# Patient Record
Sex: Female | Born: 1935
Health system: Southern US, Community
[De-identification: ages and names within clinical notes are randomized; demographics above are authoritative.]

## PROBLEM LIST (undated history)

## (undated) ENCOUNTER — Emergency Department (HOSPITAL_COMMUNITY): Discharge status: Against Medical Advice | Payer: Medicare Other

## (undated) DIAGNOSIS — M545 Low back pain, unspecified: Secondary | ICD-10-CM

## (undated) DIAGNOSIS — E785 Hyperlipidemia, unspecified: Secondary | ICD-10-CM

## (undated) DIAGNOSIS — Z860100 Personal history of colon polyps, unspecified: Secondary | ICD-10-CM

## (undated) DIAGNOSIS — K5790 Diverticulosis of intestine, part unspecified, without perforation or abscess without bleeding: Secondary | ICD-10-CM

## (undated) DIAGNOSIS — K219 Gastro-esophageal reflux disease without esophagitis: Secondary | ICD-10-CM

## (undated) DIAGNOSIS — M1990 Unspecified osteoarthritis, unspecified site: Secondary | ICD-10-CM

## (undated) HISTORY — DX: Personal history of colon polyps, unspecified: Z86.0100

## (undated) HISTORY — DX: Hyperlipidemia, unspecified: E78.5

## (undated) HISTORY — DX: Low back pain: M54.5

## (undated) HISTORY — PX: MOUTH SURGERY: SHX715

## (undated) HISTORY — DX: Low back pain, unspecified: M54.50

## (undated) HISTORY — PX: CATARACT EXTRACTION: SUR2

## (undated) HISTORY — DX: Personal history of colonic polyps: Z86.010

## (undated) HISTORY — DX: Unspecified osteoarthritis, unspecified site: M19.90

## (undated) HISTORY — DX: Diverticulosis of intestine, part unspecified, without perforation or abscess without bleeding: K57.90

## (undated) HISTORY — PX: COLONOSCOPY W/ POLYPECTOMY: SHX1380

## (undated) HISTORY — PX: UPPER GI ENDOSCOPY: SHX6162

## (undated) HISTORY — DX: Gastro-esophageal reflux disease without esophagitis: K21.9

---

## 1998-09-24 HISTORY — PX: UMBILICAL HERNIA REPAIR: SHX196

## 2002-09-24 HISTORY — PX: POLYPECTOMY: SHX149

## 2004-11-24 ENCOUNTER — Ambulatory Visit: Payer: Self-pay | Admitting: Internal Medicine

## 2005-01-10 ENCOUNTER — Ambulatory Visit: Payer: Self-pay | Admitting: Internal Medicine

## 2005-05-31 ENCOUNTER — Ambulatory Visit: Payer: Self-pay | Admitting: Internal Medicine

## 2005-06-07 ENCOUNTER — Ambulatory Visit (HOSPITAL_COMMUNITY): Admission: RE | Admit: 2005-06-07 | Discharge: 2005-06-07 | Payer: Self-pay | Admitting: Family Medicine

## 2006-06-07 ENCOUNTER — Encounter: Payer: Self-pay | Admitting: Internal Medicine

## 2006-06-07 ENCOUNTER — Ambulatory Visit: Payer: Self-pay | Admitting: Internal Medicine

## 2006-06-12 ENCOUNTER — Ambulatory Visit (HOSPITAL_COMMUNITY): Admission: RE | Admit: 2006-06-12 | Discharge: 2006-06-12 | Payer: Self-pay | Admitting: Internal Medicine

## 2007-06-18 ENCOUNTER — Ambulatory Visit (HOSPITAL_COMMUNITY): Admission: RE | Admit: 2007-06-18 | Discharge: 2007-06-18 | Payer: Self-pay | Admitting: Family Medicine

## 2007-07-09 ENCOUNTER — Ambulatory Visit: Payer: Self-pay | Admitting: Internal Medicine

## 2007-07-09 ENCOUNTER — Encounter: Payer: Self-pay | Admitting: Internal Medicine

## 2007-07-09 LAB — CONVERTED CEMR LAB
ALT: 10 units/L (ref 0–35)
AST: 13 units/L (ref 0–37)
Albumin: 4.1 g/dL (ref 3.5–5.2)
CO2: 25 meq/L (ref 19–32)
Calcium: 8.8 mg/dL (ref 8.4–10.5)
Chloride: 104 meq/L (ref 96–112)
Cholesterol: 255 mg/dL — ABNORMAL HIGH (ref 0–200)
Potassium: 4.1 meq/L (ref 3.5–5.3)
Sodium: 139 meq/L (ref 135–145)
Total CHOL/HDL Ratio: 6.1
Total Protein: 7.1 g/dL (ref 6.0–8.3)

## 2008-07-01 ENCOUNTER — Ambulatory Visit: Payer: Self-pay | Admitting: Internal Medicine

## 2008-07-07 ENCOUNTER — Ambulatory Visit (HOSPITAL_COMMUNITY): Admission: RE | Admit: 2008-07-07 | Discharge: 2008-07-07 | Payer: Self-pay | Admitting: Internal Medicine

## 2008-09-02 ENCOUNTER — Ambulatory Visit: Payer: Self-pay | Admitting: Internal Medicine

## 2008-09-02 LAB — CONVERTED CEMR LAB
BUN: 14 mg/dL (ref 6–23)
Chloride: 104 meq/L (ref 96–112)
Cholesterol: 248 mg/dL — ABNORMAL HIGH (ref 0–200)
Glucose, Bld: 91 mg/dL (ref 70–99)
LDL Cholesterol: 176 mg/dL — ABNORMAL HIGH (ref 0–99)
Potassium: 4.1 meq/L (ref 3.5–5.3)
Sodium: 139 meq/L (ref 135–145)
Total CHOL/HDL Ratio: 5.2
VLDL: 24 mg/dL (ref 0–40)

## 2008-10-01 ENCOUNTER — Ambulatory Visit: Payer: Self-pay | Admitting: *Deleted

## 2008-12-07 ENCOUNTER — Encounter (INDEPENDENT_AMBULATORY_CARE_PROVIDER_SITE_OTHER): Payer: Self-pay | Admitting: Internal Medicine

## 2008-12-07 ENCOUNTER — Ambulatory Visit: Payer: Self-pay | Admitting: Family Medicine

## 2008-12-07 LAB — CONVERTED CEMR LAB
Cholesterol: 178 mg/dL (ref 0–200)
LDL Cholesterol: 106 mg/dL — ABNORMAL HIGH (ref 0–99)
VLDL: 19 mg/dL (ref 0–40)

## 2009-07-13 ENCOUNTER — Ambulatory Visit (HOSPITAL_COMMUNITY): Admission: RE | Admit: 2009-07-13 | Discharge: 2009-07-13 | Payer: Self-pay | Admitting: Internal Medicine

## 2009-07-22 ENCOUNTER — Ambulatory Visit: Payer: Self-pay | Admitting: Internal Medicine

## 2009-09-02 ENCOUNTER — Ambulatory Visit: Payer: Self-pay | Admitting: Internal Medicine

## 2009-09-02 LAB — CONVERTED CEMR LAB
ALT: 10 units/L (ref 0–35)
AST: 13 units/L (ref 0–37)
Albumin: 4.4 g/dL (ref 3.5–5.2)
BUN: 11 mg/dL (ref 6–23)
CO2: 27 meq/L (ref 19–32)
Calcium: 8.8 mg/dL (ref 8.4–10.5)
Chloride: 105 meq/L (ref 96–112)
Cholesterol: 181 mg/dL (ref 0–200)
Potassium: 4.2 meq/L (ref 3.5–5.3)

## 2010-03-24 ENCOUNTER — Emergency Department (HOSPITAL_COMMUNITY)
Admission: EM | Admit: 2010-03-24 | Discharge: 2010-03-25 | Payer: Self-pay | Source: Home / Self Care | Admitting: Emergency Medicine

## 2010-04-25 ENCOUNTER — Encounter: Payer: Self-pay | Admitting: Internal Medicine

## 2010-04-25 ENCOUNTER — Ambulatory Visit: Payer: Self-pay | Admitting: Internal Medicine

## 2010-04-25 DIAGNOSIS — R0789 Other chest pain: Secondary | ICD-10-CM | POA: Insufficient documentation

## 2010-04-25 DIAGNOSIS — S060X9A Concussion with loss of consciousness of unspecified duration, initial encounter: Secondary | ICD-10-CM | POA: Insufficient documentation

## 2010-04-25 DIAGNOSIS — E785 Hyperlipidemia, unspecified: Secondary | ICD-10-CM | POA: Insufficient documentation

## 2010-04-25 DIAGNOSIS — M4120 Other idiopathic scoliosis, site unspecified: Secondary | ICD-10-CM | POA: Insufficient documentation

## 2010-04-25 DIAGNOSIS — M542 Cervicalgia: Secondary | ICD-10-CM | POA: Insufficient documentation

## 2010-04-25 DIAGNOSIS — M25511 Pain in right shoulder: Secondary | ICD-10-CM | POA: Insufficient documentation

## 2010-04-25 DIAGNOSIS — R1013 Epigastric pain: Secondary | ICD-10-CM | POA: Insufficient documentation

## 2010-04-25 LAB — CONVERTED CEMR LAB: Blood Glucose, Fingerstick: 96

## 2010-04-26 ENCOUNTER — Ambulatory Visit: Payer: Self-pay | Admitting: Internal Medicine

## 2010-04-26 DIAGNOSIS — M545 Low back pain, unspecified: Secondary | ICD-10-CM | POA: Insufficient documentation

## 2010-04-26 DIAGNOSIS — Z86010 Personal history of colonic polyps: Secondary | ICD-10-CM | POA: Insufficient documentation

## 2010-04-26 LAB — CONVERTED CEMR LAB
ALT: 13 units/L (ref 0–35)
AST: 13 units/L (ref 0–37)
Alkaline Phosphatase: 55 units/L (ref 39–117)
Bilirubin, Direct: 0.1 mg/dL (ref 0.0–0.3)
CO2: 30 meq/L (ref 19–32)
Chloride: 103 meq/L (ref 96–112)
Eosinophils Relative: 1.8 % (ref 0.0–5.0)
HCT: 33.9 % — ABNORMAL LOW (ref 36.0–46.0)
Hemoglobin: 11.8 g/dL — ABNORMAL LOW (ref 12.0–15.0)
Hgb A1c MFr Bld: 5.7 % (ref 4.6–6.5)
Lymphs Abs: 1.9 10*3/uL (ref 0.7–4.0)
Monocytes Relative: 7.5 % (ref 3.0–12.0)
Nitrite: NEGATIVE
Platelets: 189 10*3/uL (ref 150.0–400.0)
RBC: 3.75 M/uL — ABNORMAL LOW (ref 3.87–5.11)
Sodium: 140 meq/L (ref 135–145)
TSH: 1.61 microintl units/mL (ref 0.35–5.50)
Total Protein, Urine: NEGATIVE mg/dL
Total Protein: 6.6 g/dL (ref 6.0–8.3)
Urobilinogen, UA: 0.2 (ref 0.0–1.0)
WBC: 4.6 10*3/uL (ref 4.5–10.5)

## 2010-04-28 ENCOUNTER — Telehealth: Payer: Self-pay | Admitting: Internal Medicine

## 2010-05-11 ENCOUNTER — Ambulatory Visit: Payer: Self-pay | Admitting: Internal Medicine

## 2010-05-11 DIAGNOSIS — E538 Deficiency of other specified B group vitamins: Secondary | ICD-10-CM | POA: Insufficient documentation

## 2010-05-11 DIAGNOSIS — K219 Gastro-esophageal reflux disease without esophagitis: Secondary | ICD-10-CM | POA: Insufficient documentation

## 2010-05-11 DIAGNOSIS — E559 Vitamin D deficiency, unspecified: Secondary | ICD-10-CM | POA: Insufficient documentation

## 2010-06-01 ENCOUNTER — Encounter: Payer: Self-pay | Admitting: Internal Medicine

## 2010-06-06 ENCOUNTER — Telehealth: Payer: Self-pay | Admitting: Internal Medicine

## 2010-06-15 ENCOUNTER — Ambulatory Visit: Payer: Self-pay | Admitting: Internal Medicine

## 2010-06-21 ENCOUNTER — Encounter: Payer: Self-pay | Admitting: Internal Medicine

## 2010-07-04 ENCOUNTER — Telehealth: Payer: Self-pay | Admitting: Internal Medicine

## 2010-07-19 ENCOUNTER — Ambulatory Visit (HOSPITAL_COMMUNITY): Admission: RE | Admit: 2010-07-19 | Discharge: 2010-07-19 | Payer: Self-pay | Admitting: Internal Medicine

## 2010-07-26 ENCOUNTER — Ambulatory Visit: Payer: Self-pay | Admitting: Gynecology

## 2010-07-26 ENCOUNTER — Encounter: Payer: Self-pay | Admitting: Internal Medicine

## 2010-07-26 ENCOUNTER — Other Ambulatory Visit: Admission: RE | Admit: 2010-07-26 | Discharge: 2010-07-26 | Payer: Self-pay | Admitting: Gynecology

## 2010-08-08 ENCOUNTER — Ambulatory Visit: Payer: Self-pay | Admitting: Internal Medicine

## 2010-09-15 ENCOUNTER — Ambulatory Visit: Payer: Self-pay | Admitting: Internal Medicine

## 2010-09-15 ENCOUNTER — Encounter: Payer: Self-pay | Admitting: Internal Medicine

## 2010-09-15 DIAGNOSIS — R42 Dizziness and giddiness: Secondary | ICD-10-CM | POA: Insufficient documentation

## 2010-09-15 DIAGNOSIS — R1110 Vomiting, unspecified: Secondary | ICD-10-CM | POA: Insufficient documentation

## 2010-09-15 LAB — CONVERTED CEMR LAB
BUN: 12 mg/dL (ref 6–23)
Basophils Absolute: 0 10*3/uL (ref 0.0–0.1)
Basophils Relative: 0.6 % (ref 0.0–3.0)
Bilirubin Urine: NEGATIVE
CO2: 30 meq/L (ref 19–32)
Calcium: 9.3 mg/dL (ref 8.4–10.5)
Chloride: 102 meq/L (ref 96–112)
Creatinine, Ser: 0.8 mg/dL (ref 0.4–1.2)
Eosinophils Absolute: 0.1 10*3/uL (ref 0.0–0.7)
Eosinophils Relative: 1.1 % (ref 0.0–5.0)
GFR calc non Af Amer: 71.35 mL/min (ref >60.00)
Glucose, Bld: 89 mg/dL (ref 70–99)
HCT: 36.9 % (ref 36.0–46.0)
Hemoglobin: 12.7 g/dL (ref 12.0–15.0)
Ketones, ur: NEGATIVE mg/dL
Leukocytes, UA: NEGATIVE
Lipase: 25 units/L (ref 11.0–59.0)
Lymphocytes Relative: 42.1 % (ref 12.0–46.0)
Lymphs Abs: 2.1 10*3/uL (ref 0.7–4.0)
MCHC: 34.5 g/dL (ref 30.0–36.0)
MCV: 90.4 fL (ref 78.0–100.0)
Monocytes Absolute: 0.4 10*3/uL (ref 0.1–1.0)
Monocytes Relative: 7.8 % (ref 3.0–12.0)
Neutro Abs: 2.4 10*3/uL (ref 1.4–7.7)
Neutrophils Relative %: 48.4 % (ref 43.0–77.0)
Nitrite: NEGATIVE
Platelets: 207 10*3/uL (ref 150.0–400.0)
Potassium: 4.1 meq/L (ref 3.5–5.1)
RBC: 4.08 M/uL (ref 3.87–5.11)
RDW: 12.6 % (ref 11.5–14.6)
Sodium: 139 meq/L (ref 135–145)
Specific Gravity, Urine: 1.015 (ref 1.000–1.030)
Total Protein, Urine: NEGATIVE mg/dL
Urine Glucose: NEGATIVE mg/dL
Urobilinogen, UA: 0.2 (ref 0.0–1.0)
Vit D, 25-Hydroxy: 49 ng/mL (ref 30–89)
Vitamin B-12: 605 pg/mL (ref 211–911)
WBC: 4.9 10*3/uL (ref 4.5–10.5)
pH: 7.5 (ref 5.0–8.0)

## 2010-09-29 ENCOUNTER — Ambulatory Visit
Admission: RE | Admit: 2010-09-29 | Discharge: 2010-09-29 | Payer: Self-pay | Source: Home / Self Care | Attending: Internal Medicine | Admitting: Internal Medicine

## 2010-09-29 DIAGNOSIS — R21 Rash and other nonspecific skin eruption: Secondary | ICD-10-CM | POA: Insufficient documentation

## 2010-10-04 ENCOUNTER — Ambulatory Visit
Admission: RE | Admit: 2010-10-04 | Discharge: 2010-10-04 | Payer: Self-pay | Source: Home / Self Care | Attending: Gynecology | Admitting: Gynecology

## 2010-10-13 ENCOUNTER — Ambulatory Visit
Admission: RE | Admit: 2010-10-13 | Discharge: 2010-10-13 | Payer: Self-pay | Source: Home / Self Care | Attending: Gynecology | Admitting: Gynecology

## 2010-10-24 NOTE — Progress Notes (Signed)
Summary: REFERRAL   Phone Note Call from Patient Call back at Home Phone [phone]   Caller: Daughter - [phone] Summary of Call: Pt's daughter called. Patient needs a referral from PCP for GYN. Pt already has an apt w/[facility].  Initial call taken by: Lamar Sprinkles, CMA,  July 04, 2010 12:32 PM  Follow-up for Phone Call        ok to ref  ?name Follow-up by: Tresa Garter MD,  July 04, 2010 5:40 PM  Additional Follow-up for Phone Call Additional follow up Details #1::        Unsure of MD, order is in for [facility] - [facility] will have info b/c pt already has apt.  Additional Follow-up by: Lamar Sprinkles, CMA,  July 04, 2010 5:48 PM
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
## 2010-10-24 NOTE — Assessment & Plan Note (Signed)
Summary: FLU VAC  AVP  STC  Nurse Visit   Allergies: 1)  Lipitor (Atorvastatin Calcium)  Orders Added: 1)  Flu Vaccine 46yrs + MEDICARE PATIENTS [Q2039] 2)  Administration Flu vaccine - MCR [G0008] Flu Vaccine Consent Questions     Do you have a history of severe allergic reactions to this vaccine? no    Any prior history of allergic reactions to egg and/or gelatin? no    Do you have a sensitivity to the preservative Thimersol? no    Do you have a past history of Guillan-Barre Syndrome? no    Do you currently have an acute febrile illness? no    Have you ever had a severe reaction to latex? no    Vaccine information given and explained to patient? yes    Are you currently pregnant? no    Lot Number:[id number]   Exp Date:03/24/2011   Site Given  Left Deltoid IM.lbmedflu

## 2010-10-24 NOTE — Letter (Signed)
Summary: [facility]   Imported By: Sherian Rein 08/10/2010 10:08:56  _____________________________________________________________________  External Attachment:    Type:   Image     Comment:   External Document

## 2010-10-24 NOTE — Progress Notes (Signed)
Summary: RF Simvastatin   Phone Note Call from Patient   Summary of Call: Daughter called, pt needs rf of simvastatin to [facility] [address]. Dtr # is [phone]. Unsure if it is [facility] [address]  Initial call taken by: Lamar Sprinkles, CMA,  June 06, 2010 12:32 PM  Follow-up for Phone Call        Confirmed w/dtr Follow-up by: Lamar Sprinkles, CMA,  June 06, 2010 5:42 PM    Prescriptions: SIMVASTATIN 40 MG TABS (SIMVASTATIN) 1 once daily  #90 x 1   Entered by:   Lamar Sprinkles, CMA   Authorized by:   Tresa Garter MD   Signed by:   Lamar Sprinkles, CMA on 06/06/2010   Method used:   Electronically to        [facility] [id number] (retail)       [address] [place], [place]  [address]       Ph: [phone]       Fax: [phone]   RxID:   [id number]
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
## 2010-10-24 NOTE — Procedures (Signed)
Summary: Colonscopy/[facility]  Colonscopy/[facility]   Imported By: Sherian Rein 06/27/2010 13:55:58  _____________________________________________________________________  External Attachment:    Type:   Image     Comment:   External Document

## 2010-10-24 NOTE — Progress Notes (Signed)
Summary: GI-Dr [facility]oreta [facility]  Phone Note From Other Clinic   Summary of Call: Dr Posey Rea, Dr [facility]ore[facility] office called and said Dr Loreta Ave has reviewed pt records and is uable to see pt, pt appt was for 05/09/10 @ 1030am, Please advise. Initial call taken by: Dagoberto Reef,  April 28, 2010 4:23 PM  Follow-up for Phone Call        did she give a reason? Follow-up by: Tresa Garter MD,  April 28, 2010 5:22 PM  Additional Follow-up for Phone Call Additional follow up Details #1::        No reason was given, i called her office and was told she had not given any reason, her office is going to have her call you. Additional Follow-up by: Dagoberto Reef,  May 01, 2010 11:37 AM    Additional Follow-up for Phone Call Additional follow up Details #2::    addressed Follow-up by: Tresa Garter MD,  May 02, 2010 7:52 AM

## 2010-10-24 NOTE — Procedures (Signed)
Summary: Endoscopy/[facility]  Endoscopy/[facility]   Imported By: Sherian Rein 06/27/2010 13:57:09  _____________________________________________________________________  External Attachment:    Type:   Image     Comment:   External Document
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
## 2010-10-24 NOTE — Consult Note (Signed)
Summary: [facility]  [facility]   Imported By: Lennie Odor 06/09/2010 13:51:30  _____________________________________________________________________  External Attachment:    Type:   Image     Comment:   External Document
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
## 2010-11-01 ENCOUNTER — Institutional Professional Consult (permissible substitution): Payer: Medicare Other | Admitting: Gynecology

## 2010-11-01 ENCOUNTER — Encounter: Payer: Self-pay | Admitting: Internal Medicine

## 2010-11-01 DIAGNOSIS — M810 Age-related osteoporosis without current pathological fracture: Secondary | ICD-10-CM

## 2010-11-15 NOTE — Letter (Addendum)
Summary: [facility]   Imported By: Sherian Rein 11/08/2010 08:54:00  _____________________________________________________________________  External Attachment:    Type:   Image     Comment:   External Document

## 2010-11-17 ENCOUNTER — Other Ambulatory Visit (INDEPENDENT_AMBULATORY_CARE_PROVIDER_SITE_OTHER): Payer: Medicare Other

## 2010-11-17 DIAGNOSIS — M949 Disorder of cartilage, unspecified: Secondary | ICD-10-CM

## 2010-11-17 DIAGNOSIS — M899 Disorder of bone, unspecified: Secondary | ICD-10-CM

## 2010-11-20 ENCOUNTER — Other Ambulatory Visit: Payer: Medicare Other

## 2010-11-24 ENCOUNTER — Encounter: Payer: Self-pay | Admitting: Internal Medicine

## 2010-11-30 ENCOUNTER — Other Ambulatory Visit: Payer: Self-pay

## 2010-12-06 ENCOUNTER — Ambulatory Visit (HOSPITAL_COMMUNITY): Payer: Medicare Other | Attending: Gynecology

## 2010-12-06 DIAGNOSIS — M810 Age-related osteoporosis without current pathological fracture: Secondary | ICD-10-CM | POA: Insufficient documentation

## 2010-12-07 ENCOUNTER — Ambulatory Visit: Payer: Self-pay | Admitting: Internal Medicine

## 2010-12-08 ENCOUNTER — Ambulatory Visit (INDEPENDENT_AMBULATORY_CARE_PROVIDER_SITE_OTHER): Payer: Medicare Other | Admitting: Internal Medicine

## 2010-12-08 ENCOUNTER — Encounter: Payer: Self-pay | Admitting: Internal Medicine

## 2010-12-08 DIAGNOSIS — R509 Fever, unspecified: Secondary | ICD-10-CM | POA: Insufficient documentation

## 2010-12-08 DIAGNOSIS — K219 Gastro-esophageal reflux disease without esophagitis: Secondary | ICD-10-CM

## 2010-12-08 DIAGNOSIS — E785 Hyperlipidemia, unspecified: Secondary | ICD-10-CM

## 2010-12-08 DIAGNOSIS — E538 Deficiency of other specified B group vitamins: Secondary | ICD-10-CM

## 2010-12-08 DIAGNOSIS — E559 Vitamin D deficiency, unspecified: Secondary | ICD-10-CM

## 2010-12-10 DIAGNOSIS — M810 Age-related osteoporosis without current pathological fracture: Secondary | ICD-10-CM | POA: Insufficient documentation

## 2010-12-10 LAB — LACTIC ACID, PLASMA: Lactic Acid, Venous: 3.7 mmol/L — ABNORMAL HIGH (ref 0.5–2.2)

## 2010-12-10 LAB — CBC
Hemoglobin: 12.6 g/dL (ref 12.0–15.0)
MCH: 31.8 pg (ref 26.0–34.0)
MCHC: 35.1 g/dL (ref 30.0–36.0)
MCV: 90.6 fL (ref 78.0–100.0)
Platelets: 213 10*3/uL (ref 150–400)

## 2010-12-10 LAB — POCT I-STAT, CHEM 8
BUN: 22 mg/dL (ref 6–23)
Calcium, Ion: 1.02 mmol/L — ABNORMAL LOW (ref 1.12–1.32)
HCT: 37 % (ref 36.0–46.0)
Hemoglobin: 12.6 g/dL (ref 12.0–15.0)
Sodium: 142 mEq/L (ref 135–145)
TCO2: 26 mmol/L (ref 0–100)

## 2010-12-10 LAB — PROTIME-INR
INR: 0.96 (ref 0.00–1.49)
Prothrombin Time: 12.7 seconds (ref 11.6–15.2)

## 2010-12-10 LAB — COMPREHENSIVE METABOLIC PANEL
AST: 18 U/L (ref 0–37)
BUN: 19 mg/dL (ref 6–23)
CO2: 24 mEq/L (ref 19–32)
Calcium: 8.9 mg/dL (ref 8.4–10.5)
Creatinine, Ser: 0.98 mg/dL (ref 0.4–1.2)
GFR calc Af Amer: >60 mL/min (ref >60)
GFR calc non Af Amer: 56 mL/min — ABNORMAL LOW (ref >60)

## 2010-12-10 LAB — APTT: aPTT: 28 seconds (ref 24–37)

## 2010-12-12 NOTE — Letter (Signed)
Summary: 07-26-10 to 11-24-10/[facility]  07-26-10 to 11-24-10/[facility]   Imported By: Sherian Rein 12/06/2010 12:22:53  _____________________________________________________________________  External Attachment:    Type:   Image     Comment:   External Document
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
## 2010-12-22 ENCOUNTER — Other Ambulatory Visit (INDEPENDENT_AMBULATORY_CARE_PROVIDER_SITE_OTHER): Payer: Medicare Other | Admitting: Internal Medicine

## 2010-12-22 ENCOUNTER — Other Ambulatory Visit: Payer: Self-pay | Admitting: Internal Medicine

## 2010-12-22 ENCOUNTER — Other Ambulatory Visit: Payer: Medicare Other

## 2010-12-22 ENCOUNTER — Other Ambulatory Visit (INDEPENDENT_AMBULATORY_CARE_PROVIDER_SITE_OTHER): Payer: Medicare Other

## 2010-12-22 DIAGNOSIS — E538 Deficiency of other specified B group vitamins: Secondary | ICD-10-CM

## 2010-12-22 DIAGNOSIS — E7800 Pure hypercholesterolemia, unspecified: Secondary | ICD-10-CM

## 2010-12-22 DIAGNOSIS — E539 Vitamin B deficiency, unspecified: Secondary | ICD-10-CM

## 2010-12-22 DIAGNOSIS — E559 Vitamin D deficiency, unspecified: Secondary | ICD-10-CM

## 2010-12-22 DIAGNOSIS — Z13220 Encounter for screening for lipoid disorders: Secondary | ICD-10-CM

## 2010-12-22 LAB — LIPID PANEL
HDL: 48.1 mg/dL (ref >39.00)
Triglycerides: 85 mg/dL (ref 0.0–149.0)

## 2010-12-22 LAB — BASIC METABOLIC PANEL
CO2: 27 mEq/L (ref 19–32)
Calcium: 8.2 mg/dL — ABNORMAL LOW (ref 8.4–10.5)
Creatinine, Ser: 0.9 mg/dL (ref 0.4–1.2)
GFR: 67.53 mL/min (ref >60.00)
Glucose, Bld: 87 mg/dL (ref 70–99)
Sodium: 140 mEq/L (ref 135–145)

## 2010-12-22 LAB — HEPATIC FUNCTION PANEL
ALT: 13 U/L (ref 0–35)
AST: 14 U/L (ref 0–37)
Bilirubin, Direct: 0.1 mg/dL (ref 0.0–0.3)
Total Bilirubin: 0.5 mg/dL (ref 0.3–1.2)
Total Protein: 6.3 g/dL (ref 6.0–8.3)

## 2010-12-22 LAB — LDL CHOLESTEROL, DIRECT: Direct LDL: 196.7 mg/dL

## 2011-05-09 ENCOUNTER — Ambulatory Visit (INDEPENDENT_AMBULATORY_CARE_PROVIDER_SITE_OTHER): Payer: Medicare Other | Admitting: Internal Medicine

## 2011-05-09 ENCOUNTER — Encounter: Payer: Self-pay | Admitting: Internal Medicine

## 2011-05-09 DIAGNOSIS — E559 Vitamin D deficiency, unspecified: Secondary | ICD-10-CM

## 2011-05-09 DIAGNOSIS — E538 Deficiency of other specified B group vitamins: Secondary | ICD-10-CM

## 2011-05-09 DIAGNOSIS — R1110 Vomiting, unspecified: Secondary | ICD-10-CM

## 2011-05-09 DIAGNOSIS — K219 Gastro-esophageal reflux disease without esophagitis: Secondary | ICD-10-CM

## 2011-05-09 NOTE — Assessment & Plan Note (Signed)
Start ASA

## 2011-05-09 NOTE — Progress Notes (Signed)
  Subjective:    Patient ID: Joyce Riley, female    DOB: 1934/02/23, 77 y.o.   MRN: [id number]  HPI    Review of Systems     Objective:   Physical Exam        Assessment & Plan:

## 2011-05-09 NOTE — Assessment & Plan Note (Signed)
On Rx 

## 2011-05-09 NOTE — Assessment & Plan Note (Signed)
OK to restart Prilosec. Stopped Zocor

## 2011-05-09 NOTE — Assessment & Plan Note (Signed)
Cont Rx 

## 2011-05-09 NOTE — Progress Notes (Signed)
  Subjective:    Patient ID: Joyce Riley, female    DOB: Jan 26, 1934, 77 y.o.   MRN: [id number]  HPI  The patient presents for a follow-up of  chronic hypertension, chronic dyslipidemia, GERD controlled with medicines She felt dizzy and nauseated and stopped Zocor and Prilosec - sx resolved.  She cont Vit D and B12 She stopped ASA due to bruising    Review of Systems  Constitutional: Negative for chills, activity change, appetite change, fatigue and unexpected weight change.  HENT: Negative for congestion, mouth sores and sinus pressure.   Eyes: Negative for visual disturbance.  Respiratory: Negative for cough and chest tightness.   Gastrointestinal: Positive for nausea, vomiting and abdominal pain.  Genitourinary: Negative for frequency, difficulty urinating and vaginal pain.  Musculoskeletal: Negative for back pain and gait problem.  Skin: Negative for pallor and rash.  Neurological: Positive for dizziness. Negative for tremors, weakness, numbness and headaches.  Psychiatric/Behavioral: Negative for confusion and sleep disturbance.       Objective:   Physical Exam  Constitutional: She appears well-developed and well-nourished. No distress.  HENT:  Head: Normocephalic.  Right Ear: External ear normal.  Left Ear: External ear normal.  Nose: Nose normal.  Mouth/Throat: Oropharynx is clear and moist.  Eyes: Conjunctivae are normal. Pupils are equal, round, and reactive to light. Right eye exhibits no discharge. Left eye exhibits no discharge.  Neck: Normal range of motion. Neck supple. No JVD present. No tracheal deviation present. No thyromegaly present.  Cardiovascular: Normal rate, regular rhythm and normal heart sounds.   Pulmonary/Chest: No stridor. No respiratory distress. She has no wheezes.  Abdominal: Soft. Bowel sounds are normal. She exhibits no distension and no mass. There is no tenderness. There is no rebound and no guarding.  Musculoskeletal: She exhibits no  edema and no tenderness.  Lymphadenopathy:    She has no cervical adenopathy.  Neurological: She displays normal reflexes. No cranial nerve deficit. She exhibits normal muscle tone. Coordination normal.  Skin: No rash noted. No erythema.  Psychiatric: She has a normal mood and affect. Her behavior is normal. Judgment and thought content normal.          Assessment & Plan:

## 2011-06-21 ENCOUNTER — Ambulatory Visit (INDEPENDENT_AMBULATORY_CARE_PROVIDER_SITE_OTHER): Payer: Medicare Other | Admitting: *Deleted

## 2011-06-21 DIAGNOSIS — Z23 Encounter for immunization: Secondary | ICD-10-CM

## 2011-07-02 ENCOUNTER — Other Ambulatory Visit: Payer: Self-pay | Admitting: Gynecology

## 2011-07-02 DIAGNOSIS — Z1231 Encounter for screening mammogram for malignant neoplasm of breast: Secondary | ICD-10-CM

## 2011-07-25 ENCOUNTER — Ambulatory Visit (HOSPITAL_COMMUNITY): Payer: Medicare Other

## 2011-08-08 ENCOUNTER — Ambulatory Visit (HOSPITAL_COMMUNITY)
Admission: RE | Admit: 2011-08-08 | Discharge: 2011-08-08 | Disposition: A | Discharge status: Home / Self Care | Payer: Medicare Other | Source: Ambulatory Visit | Attending: Gynecology | Admitting: Gynecology

## 2011-08-08 DIAGNOSIS — Z1231 Encounter for screening mammogram for malignant neoplasm of breast: Secondary | ICD-10-CM

## 2011-08-22 ENCOUNTER — Ambulatory Visit (INDEPENDENT_AMBULATORY_CARE_PROVIDER_SITE_OTHER): Payer: Medicare Other | Admitting: Gynecology

## 2011-08-22 ENCOUNTER — Encounter: Payer: Self-pay | Admitting: Gynecology

## 2011-08-22 ENCOUNTER — Other Ambulatory Visit (HOSPITAL_COMMUNITY)
Admission: RE | Admit: 2011-08-22 | Discharge: 2011-08-22 | Disposition: A | Discharge status: Home / Self Care | Payer: Medicare Other | Source: Ambulatory Visit | Attending: Gynecology | Admitting: Gynecology

## 2011-08-22 DIAGNOSIS — Z124 Encounter for screening for malignant neoplasm of cervix: Secondary | ICD-10-CM

## 2011-08-22 DIAGNOSIS — Z1211 Encounter for screening for malignant neoplasm of colon: Secondary | ICD-10-CM

## 2011-08-22 DIAGNOSIS — M810 Age-related osteoporosis without current pathological fracture: Secondary | ICD-10-CM

## 2011-08-22 NOTE — Progress Notes (Signed)
Joyce Riley 1934/09/11 [id number]   History:    77 y.o.  for exam. Review of her record indicated that she was diagnosed with osteoporosis based on bone density study in January of 2012. Patient had a normal calcium, vitamin D, and PTH. Because of patient's severe kyphosis and scoliosis the distal one third radius was utilized for testing. Her lowest T score was -3.1 and she was started on intravenous Reclast. Her last mammogram was October of this year which was normal and her colonoscopy was 3 years ago whereby a benign colonic polyps were identified. Dr. Posey Rea is her primary physician who is monitoring also her hypercholesterolemia and had done all her labs earlier this year.  Past medical history,surgical history, family history and social history were all reviewed and documented in the EPIC chart.  Gynecologic History No LMP recorded. Patient is postmenopausal. Contraception: Postmenopausal Last Pap: 2011. Results were: normal Last mammogram: 2012. Results were: normal  Obstetric History OB History    Grav Para Term Preterm Abortions TAB SAB Ect Mult Living   4 1 1  3  3   1      # Outc Date GA Lbr Len/2nd Wgt Sex Del Anes PTL Lv   1 TRM     F SVD  No Yes   2 SAB            3 SAB            4 SAB                ROS:  Was performed and pertinent positives and negatives are included in the history.  Exam: chaperone present  BP 128/70  Ht 4' 7.75" (1.416 m)  Wt 126 lb (57.153 kg)  BMI 28.50 kg/m2  Body mass index is 28.50 kg/(m^2).  General appearance : Well developed well nourished female. No acute distress HEENT: Neck supple, trachea midline, no carotid bruits, no thyroidmegaly Lungs: Clear to auscultation, no rhonchi or wheezes, or rib retractions  Heart: Regular rate and rhythm, no murmurs or gallops Breast:Examined in sitting and supine position were symmetrical in appearance, no palpable masses or tenderness,  no skin retraction, no nipple inversion, no nipple  discharge, no skin discoloration, no axillary or supraclavicular lymphadenopathy Abdomen: no palpable masses or tenderness, no rebound or guarding Extremities: no edema or skin discoloration or tenderness  Pelvic:  Bartholin, Urethra, Skene Glands: Within normal limits             Vagina: No gross lesions or discharge second-degree rectocele  Cervix: No gross lesions or discharge  Uterus  axial, normal size, shape and consistency, non-tender and mobile  Adnexa  Without masses or tenderness  Anus and perineum  normal   Rectovaginal  normal sphincter tone without palpated masses or tenderness             Hemoccult obtained results pending at time of this dictation     Assessment/Plan:  77 y.o. with diagnosis of osteoporosis earlier part of this year. She was started on intravenous Reclast. She would've completed one year of treatment in February at which time she will be due for her next dose and also a bone density study to assess response to therapy. She is taking her calcium and vitamin D twice a day as well no additional labral or be drawn today. She will need a BUN and creatinine along with a calcium level before the intravenous infusion of the Reclast in February. She will also scheduled for bone  density study at the same time. Pap smear was done today. We discussed the new guidelines as follows:                              Screening methods for cervical cancer: Joint Recommendations of the American Cancer Society, the American Society ofColposcopy and Cervical Pathology (ASCCP), and the American Society for Clinical Pathology:  Women's age 59-29 years should be tested with cervical cytology alone, and screening should be performed every 3 years. Co-testing should not be performed in women younger than 75 years of age. For women age 64-45 years of age, co-testing with cytology and HPV testing every 5 years is preferred. Screening with cytology alone every 3 years is acceptable.      Ok Edwards MD, 11:49 AM 08/22/2011

## 2011-08-22 NOTE — Patient Instructions (Signed)
This is a reminder that in February your due for your next Reclast IV infusion. Please call Sherrilyn Rist at [phone] in January for her to make arrangements you. She will need to come in to the office a few weeks prior to have her lab work drawn. Also in February she will need a bone density study to see how the first year of treatment has resulted as to your osteoporosis.

## 2011-09-10 ENCOUNTER — Other Ambulatory Visit (INDEPENDENT_AMBULATORY_CARE_PROVIDER_SITE_OTHER): Payer: Medicare Other

## 2011-09-10 ENCOUNTER — Encounter: Payer: Self-pay | Admitting: Internal Medicine

## 2011-09-10 ENCOUNTER — Ambulatory Visit (INDEPENDENT_AMBULATORY_CARE_PROVIDER_SITE_OTHER): Payer: Medicare Other | Admitting: Internal Medicine

## 2011-09-10 VITALS — BP 130/80 | Temp 98.5°F | Wt 126.0 lb

## 2011-09-10 DIAGNOSIS — M545 Low back pain, unspecified: Secondary | ICD-10-CM

## 2011-09-10 DIAGNOSIS — E559 Vitamin D deficiency, unspecified: Secondary | ICD-10-CM

## 2011-09-10 DIAGNOSIS — E785 Hyperlipidemia, unspecified: Secondary | ICD-10-CM

## 2011-09-10 DIAGNOSIS — M4120 Other idiopathic scoliosis, site unspecified: Secondary | ICD-10-CM

## 2011-09-10 DIAGNOSIS — E538 Deficiency of other specified B group vitamins: Secondary | ICD-10-CM

## 2011-09-10 LAB — BASIC METABOLIC PANEL
CO2: 27 mEq/L (ref 19–32)
Calcium: 8.8 mg/dL (ref 8.4–10.5)
Creatinine, Ser: 0.9 mg/dL (ref 0.4–1.2)
Glucose, Bld: 98 mg/dL (ref 70–99)
Sodium: 141 mEq/L (ref 135–145)

## 2011-09-10 LAB — CBC WITH DIFFERENTIAL/PLATELET
Basophils Absolute: 0 10*3/uL (ref 0.0–0.1)
Eosinophils Absolute: 0.1 10*3/uL (ref 0.0–0.7)
Hemoglobin: 12.1 g/dL (ref 12.0–15.0)
Lymphocytes Relative: 45.9 % (ref 12.0–46.0)
MCHC: 34.4 g/dL (ref 30.0–36.0)
MCV: 90.1 fl (ref 78.0–100.0)
Monocytes Absolute: 0.5 10*3/uL (ref 0.1–1.0)
Neutro Abs: 2.1 10*3/uL (ref 1.4–7.7)
RDW: 12.4 % (ref 11.5–14.6)

## 2011-09-10 MED ORDER — CYANOCOBALAMIN 500 MCG PO TABS: 500.0000 ug | ORAL_TABLET | Freq: Every day | ORAL | Status: DC

## 2011-09-10 MED ORDER — CHOLECALCIFEROL 25 MCG (1000 UT) PO TABS: 1000.0000 [IU] | ORAL_TABLET | Freq: Every day | ORAL | Status: AC

## 2011-09-10 NOTE — Assessment & Plan Note (Signed)
Continue with current prescription therapy as reflected on the Med list.  

## 2011-09-10 NOTE — Progress Notes (Signed)
  Subjective:    Patient ID: Joyce Riley, female    DOB: 10-Mar-1934, 77 y.o.   MRN: [id number]  HPI   The patient is here to follow up on chronic  anxiety,  B12 and Vit D def, scoliosis/LBP symptoms controlled with medicines, diet and exercise. She stopped Lipitor and ASA, prilosec - side effects   Review of Systems  Constitutional: Negative for chills, activity change, appetite change, fatigue and unexpected weight change.  HENT: Negative for congestion, mouth sores and sinus pressure.   Eyes: Negative for visual disturbance.  Respiratory: Negative for cough and chest tightness.   Gastrointestinal: Negative for nausea and abdominal pain.  Genitourinary: Negative for frequency, difficulty urinating and vaginal pain.  Musculoskeletal: Positive for back pain. Negative for gait problem.  Skin: Negative for pallor and rash.  Neurological: Negative for dizziness, tremors, weakness, numbness and headaches.  Psychiatric/Behavioral: Negative for confusion and sleep disturbance.       Objective:   Physical Exam  Constitutional: She appears well-developed and well-nourished. No distress.  HENT:  Head: Normocephalic.  Right Ear: External ear normal.  Left Ear: External ear normal.  Nose: Nose normal.  Mouth/Throat: Oropharynx is clear and moist.  Eyes: Conjunctivae are normal. Pupils are equal, round, and reactive to light. Right eye exhibits no discharge. Left eye exhibits no discharge.  Neck: Normal range of motion. Neck supple. No JVD present. No tracheal deviation present. No thyromegaly present.  Cardiovascular: Normal rate, regular rhythm and normal heart sounds.   Pulmonary/Chest: No stridor. No respiratory distress. She has no wheezes.  Abdominal: Soft. Bowel sounds are normal. She exhibits no distension and no mass. There is no tenderness. There is no rebound and no guarding.  Musculoskeletal: She exhibits no edema and no tenderness.  Lymphadenopathy:    She has no cervical  adenopathy.  Neurological: She displays normal reflexes. No cranial nerve deficit. She exhibits normal muscle tone. Coordination normal.       Thor scoliosis LS is tender  Skin: No rash noted. No erythema.  Psychiatric: She has a normal mood and affect. Her behavior is normal. Judgment and thought content normal.          Assessment & Plan:

## 2011-09-10 NOTE — Assessment & Plan Note (Signed)
Chronic -- statin intolerant 

## 2011-09-10 NOTE — Assessment & Plan Note (Signed)
Start PT 

## 2011-09-10 NOTE — Assessment & Plan Note (Signed)
Will start PT.

## 2011-10-04 ENCOUNTER — Ambulatory Visit: Payer: Medicare Other | Attending: Internal Medicine

## 2011-10-04 DIAGNOSIS — R293 Abnormal posture: Secondary | ICD-10-CM | POA: Insufficient documentation

## 2011-10-04 DIAGNOSIS — M545 Low back pain, unspecified: Secondary | ICD-10-CM | POA: Diagnosis not present

## 2011-10-04 DIAGNOSIS — M2560 Stiffness of unspecified joint, not elsewhere classified: Secondary | ICD-10-CM | POA: Insufficient documentation

## 2011-10-04 DIAGNOSIS — IMO0001 Reserved for inherently not codable concepts without codable children: Secondary | ICD-10-CM | POA: Insufficient documentation

## 2011-10-04 DIAGNOSIS — R5381 Other malaise: Secondary | ICD-10-CM | POA: Insufficient documentation

## 2011-10-10 ENCOUNTER — Ambulatory Visit: Payer: Medicare Other | Admitting: Rehabilitation

## 2011-10-15 ENCOUNTER — Ambulatory Visit: Payer: Medicare Other | Admitting: Physical Therapy

## 2011-10-17 ENCOUNTER — Ambulatory Visit: Payer: Medicare Other | Admitting: Physical Therapy

## 2011-10-22 ENCOUNTER — Ambulatory Visit: Payer: Medicare Other | Admitting: Rehabilitation

## 2011-10-24 ENCOUNTER — Encounter: Payer: Medicare Other | Admitting: Physical Therapy

## 2011-10-25 ENCOUNTER — Ambulatory Visit: Payer: Medicare Other | Admitting: Physical Therapy

## 2011-10-29 ENCOUNTER — Ambulatory Visit: Payer: Medicare Other | Attending: Internal Medicine | Admitting: Rehabilitation

## 2011-10-29 DIAGNOSIS — M2560 Stiffness of unspecified joint, not elsewhere classified: Secondary | ICD-10-CM | POA: Insufficient documentation

## 2011-10-29 DIAGNOSIS — R5381 Other malaise: Secondary | ICD-10-CM | POA: Insufficient documentation

## 2011-10-29 DIAGNOSIS — M545 Low back pain, unspecified: Secondary | ICD-10-CM | POA: Insufficient documentation

## 2011-10-29 DIAGNOSIS — IMO0001 Reserved for inherently not codable concepts without codable children: Secondary | ICD-10-CM | POA: Insufficient documentation

## 2011-10-29 DIAGNOSIS — R293 Abnormal posture: Secondary | ICD-10-CM | POA: Insufficient documentation

## 2011-10-31 ENCOUNTER — Ambulatory Visit: Payer: Medicare Other | Admitting: Rehabilitation

## 2011-11-06 ENCOUNTER — Ambulatory Visit: Payer: Medicare Other | Admitting: Physical Therapy

## 2011-11-08 ENCOUNTER — Ambulatory Visit: Payer: Medicare Other | Admitting: Physical Therapy

## 2011-12-12 ENCOUNTER — Ambulatory Visit (INDEPENDENT_AMBULATORY_CARE_PROVIDER_SITE_OTHER): Payer: Medicare Other

## 2011-12-12 ENCOUNTER — Other Ambulatory Visit: Payer: Self-pay | Admitting: Gynecology

## 2011-12-12 DIAGNOSIS — M810 Age-related osteoporosis without current pathological fracture: Secondary | ICD-10-CM

## 2011-12-13 ENCOUNTER — Encounter: Payer: Self-pay | Admitting: Internal Medicine

## 2011-12-13 ENCOUNTER — Ambulatory Visit (INDEPENDENT_AMBULATORY_CARE_PROVIDER_SITE_OTHER): Payer: Medicare Other | Admitting: Internal Medicine

## 2011-12-13 VITALS — BP 130/80 | HR 80 | Temp 97.0°F | Resp 16 | Wt 125.0 lb

## 2011-12-13 DIAGNOSIS — E559 Vitamin D deficiency, unspecified: Secondary | ICD-10-CM

## 2011-12-13 DIAGNOSIS — M545 Low back pain, unspecified: Secondary | ICD-10-CM | POA: Diagnosis not present

## 2011-12-13 DIAGNOSIS — E785 Hyperlipidemia, unspecified: Secondary | ICD-10-CM

## 2011-12-13 DIAGNOSIS — E538 Deficiency of other specified B group vitamins: Secondary | ICD-10-CM

## 2011-12-13 DIAGNOSIS — K219 Gastro-esophageal reflux disease without esophagitis: Secondary | ICD-10-CM

## 2011-12-13 MED ORDER — OMEPRAZOLE 40 MG PO CPDR: 40.0000 mg | DELAYED_RELEASE_CAPSULE | Freq: Every day | ORAL | Status: DC

## 2011-12-13 NOTE — Assessment & Plan Note (Signed)
Continue with current prescription therapy as reflected on the Med list.  

## 2011-12-13 NOTE — Progress Notes (Signed)
Patient ID: Joyce Riley, female   DOB: 1934-04-10, 78 y.o.   MRN: [id number]  Subjective:    Patient ID: Joyce Riley, female    DOB: 03-24-1934, 78 y.o.   MRN: [id number]  HPI   The patient is here to follow up on chronic  anxiety,  B12 and Vit D def, scoliosis/LBP symptoms controlled with medicines, diet and exercise. She stopped Lipitor and ASA, prilosec - side effects  BP Readings from Last 3 Encounters:  12/13/11 130/80  09/10/11 130/80  08/22/11 128/70   Wt Readings from Last 3 Encounters:  12/13/11 125 lb (56.7 kg)  09/10/11 126 lb (57.153 kg)  08/22/11 126 lb (57.153 kg)        Review of Systems  Constitutional: Negative for chills, activity change, appetite change, fatigue and unexpected weight change.  HENT: Negative for congestion, mouth sores and sinus pressure.   Eyes: Negative for visual disturbance.  Respiratory: Negative for cough and chest tightness.   Gastrointestinal: Negative for nausea and abdominal pain.  Genitourinary: Negative for frequency, difficulty urinating and vaginal pain.  Musculoskeletal: Positive for back pain. Negative for gait problem.  Skin: Negative for pallor and rash.  Neurological: Negative for dizziness, tremors, weakness, numbness and headaches.  Psychiatric/Behavioral: Negative for confusion and sleep disturbance.       Objective:   Physical Exam  Constitutional: She appears well-developed and well-nourished. No distress.  HENT:  Head: Normocephalic.  Right Ear: External ear normal.  Left Ear: External ear normal.  Nose: Nose normal.  Mouth/Throat: Oropharynx is clear and moist.  Eyes: Conjunctivae are normal. Pupils are equal, round, and reactive to light. Right eye exhibits no discharge. Left eye exhibits no discharge.  Neck: Normal range of motion. Neck supple. No JVD present. No tracheal deviation present. No thyromegaly present.  Cardiovascular: Normal rate, regular rhythm and normal heart sounds.   Pulmonary/Chest:  No stridor. No respiratory distress. She has no wheezes.  Abdominal: Soft. Bowel sounds are normal. She exhibits no distension and no mass. There is no tenderness. There is no rebound and no guarding.  Musculoskeletal: She exhibits no edema and no tenderness.  Lymphadenopathy:    She has no cervical adenopathy.  Neurological: She displays normal reflexes. No cranial nerve deficit. She exhibits normal muscle tone. Coordination normal.       Thor scoliosis LS is tender  Skin: No rash noted. No erythema.  Psychiatric: She has a normal mood and affect. Her behavior is normal. Judgment and thought content normal.    Lab Results  Component Value Date   WBC 5.0 09/10/2011   HGB 12.1 09/10/2011   HCT 35.3* 09/10/2011   PLT 210.0 09/10/2011   GLUCOSE 98 09/10/2011   CHOL 265* 12/22/2010   TRIG 85.0 12/22/2010   HDL 48.10 12/22/2010   LDLDIRECT 196.7 12/22/2010   LDLCALC 111* 09/02/2009   ALT 13 12/22/2010   AST 14 12/22/2010   NA 141 09/10/2011   K 5.1 09/10/2011   CL 106 09/10/2011   CREATININE 0.9 09/10/2011   BUN 18 09/10/2011   CO2 27 09/10/2011   TSH 1.61 04/26/2010   INR 0.96 03/24/2010   HGBA1C 5.7 04/26/2010         Assessment & Plan:

## 2011-12-13 NOTE — Assessment & Plan Note (Signed)
  On diet  

## 2011-12-28 ENCOUNTER — Telehealth: Payer: Self-pay | Admitting: *Deleted

## 2011-12-28 NOTE — Telephone Encounter (Signed)
PA form for omeprazole being faxed to our office.

## 2011-12-28 NOTE — Telephone Encounter (Signed)
Pt's daughter called stating that rx for Omeprazole for stomach issues was too expensive. They are requesting another generic alternative rx to be sent to pharmacy.

## 2012-01-02 MED ORDER — DEXLANSOPRAZOLE 30 MG PO CPDR: 30.0000 mg | DELAYED_RELEASE_CAPSULE | Freq: Every day | ORAL | Status: DC

## 2012-01-02 NOTE — Telephone Encounter (Signed)
Rec fax from [employer] stating PA for Omeprazole is denied. It also states Omeprazole 10 and 20 mg, Dexilant and Nexium are covered alternatives. Please advise.

## 2012-01-02 NOTE — Telephone Encounter (Signed)
Ok dexilant Thx

## 2012-01-03 NOTE — Telephone Encounter (Signed)
Tried to call daughter to advise new Rx. No answer.

## 2012-01-07 NOTE — Telephone Encounter (Signed)
Left detailed mess informing pt's daughter of below. 

## 2012-01-11 ENCOUNTER — Telehealth: Payer: Self-pay | Admitting: *Deleted

## 2012-01-11 ENCOUNTER — Other Ambulatory Visit: Payer: Self-pay | Admitting: *Deleted

## 2012-01-11 DIAGNOSIS — M810 Age-related osteoporosis without current pathological fracture: Secondary | ICD-10-CM

## 2012-01-11 NOTE — Telephone Encounter (Signed)
Lm for patient to call.  Reclast information

## 2012-01-11 NOTE — Telephone Encounter (Signed)
Will have labs done on 01/23/12 and will set up reclast after results back.

## 2012-01-11 NOTE — Telephone Encounter (Signed)
Left message on Jae [facility]ire voicemail that JF recommendations for bone density was to continue reclast.

## 2012-01-23 ENCOUNTER — Other Ambulatory Visit: Payer: Medicare Other

## 2012-01-23 DIAGNOSIS — M810 Age-related osteoporosis without current pathological fracture: Secondary | ICD-10-CM | POA: Diagnosis not present

## 2012-01-28 NOTE — Telephone Encounter (Signed)
  appt set up for Reclast on 5 @10am . Order faxed.

## 2012-02-04 ENCOUNTER — Other Ambulatory Visit (HOSPITAL_COMMUNITY): Payer: Self-pay | Admitting: *Deleted

## 2012-02-08 ENCOUNTER — Encounter (HOSPITAL_COMMUNITY)
Admission: RE | Admit: 2012-02-08 | Discharge: 2012-02-08 | Disposition: A | Discharge status: Home / Self Care | Payer: Medicare Other | Source: Ambulatory Visit | Attending: Gynecology | Admitting: Gynecology

## 2012-02-08 DIAGNOSIS — M810 Age-related osteoporosis without current pathological fracture: Secondary | ICD-10-CM | POA: Diagnosis not present

## 2012-02-08 MED ORDER — ZOLEDRONIC ACID 5 MG/100ML IV SOLN
5.0000 mg | Freq: Once | INTRAVENOUS | Status: AC
  Administered 2012-02-08: 5 mg via INTRAVENOUS
  Filled 2012-02-08: qty 100

## 2012-02-08 NOTE — Progress Notes (Signed)
Spoke with carrie at Dr. Fontaine No office about the reclast allergy that was entered by Dr. Posey Rea in epic.  Okay to proceed today with reclast infusion.  Pt stated that she did not have low BP just a low grade fever and flu like symptoms which are side effects of the medicine anyway.

## 2012-03-21 DIAGNOSIS — K1370 Unspecified lesions of oral mucosa: Secondary | ICD-10-CM | POA: Diagnosis not present

## 2012-03-26 DIAGNOSIS — H43399 Other vitreous opacities, unspecified eye: Secondary | ICD-10-CM | POA: Diagnosis not present

## 2012-03-26 DIAGNOSIS — Z961 Presence of intraocular lens: Secondary | ICD-10-CM | POA: Diagnosis not present

## 2012-03-26 DIAGNOSIS — H40029 Open angle with borderline findings, high risk, unspecified eye: Secondary | ICD-10-CM | POA: Diagnosis not present

## 2012-03-26 DIAGNOSIS — H26499 Other secondary cataract, unspecified eye: Secondary | ICD-10-CM | POA: Diagnosis not present

## 2012-04-15 ENCOUNTER — Ambulatory Visit: Payer: Medicare Other | Admitting: Internal Medicine

## 2012-06-09 ENCOUNTER — Encounter: Payer: Self-pay | Admitting: Internal Medicine

## 2012-06-09 ENCOUNTER — Ambulatory Visit (INDEPENDENT_AMBULATORY_CARE_PROVIDER_SITE_OTHER): Payer: Medicare Other | Admitting: Internal Medicine

## 2012-06-09 VITALS — BP 120/76 | HR 72 | Temp 98.4°F | Resp 16 | Wt 122.0 lb

## 2012-06-09 DIAGNOSIS — M545 Low back pain, unspecified: Secondary | ICD-10-CM

## 2012-06-09 DIAGNOSIS — Z23 Encounter for immunization: Secondary | ICD-10-CM | POA: Diagnosis not present

## 2012-06-09 DIAGNOSIS — K219 Gastro-esophageal reflux disease without esophagitis: Secondary | ICD-10-CM | POA: Diagnosis not present

## 2012-06-09 DIAGNOSIS — E559 Vitamin D deficiency, unspecified: Secondary | ICD-10-CM

## 2012-06-09 DIAGNOSIS — E538 Deficiency of other specified B group vitamins: Secondary | ICD-10-CM

## 2012-06-09 DIAGNOSIS — R42 Dizziness and giddiness: Secondary | ICD-10-CM

## 2012-06-09 DIAGNOSIS — E785 Hyperlipidemia, unspecified: Secondary | ICD-10-CM

## 2012-06-09 MED ORDER — OMEPRAZOLE 40 MG PO CPDR: 40.0000 mg | DELAYED_RELEASE_CAPSULE | Freq: Every day | ORAL | Status: DC

## 2012-06-09 NOTE — Assessment & Plan Note (Signed)
  On diet  

## 2012-06-09 NOTE — Assessment & Plan Note (Signed)
Continue with current prescription therapy as reflected on the Med list.  

## 2012-06-09 NOTE — Assessment & Plan Note (Signed)
Resolved

## 2012-06-09 NOTE — Progress Notes (Signed)
  Subjective:    Patient ID: Joyce Riley, female    DOB: May 12, 1934, 78 y.o.   MRN: [id number]  HPI   The patient is here to follow up on chronic  anxiety,  B12 and Vit D def, scoliosis/LBP symptoms controlled with medicines, diet and exercise.   BP Readings from Last 3 Encounters:  06/09/12 120/76  02/08/12 109/67  12/13/11 130/80   Wt Readings from Last 3 Encounters:  06/09/12 122 lb (55.339 kg)  02/08/12 126 lb (57.153 kg)  12/13/11 125 lb (56.7 kg)        Review of Systems  Constitutional: Negative for chills, activity change, appetite change, fatigue and unexpected weight change.  HENT: Negative for congestion, mouth sores and sinus pressure.   Eyes: Negative for visual disturbance.  Respiratory: Negative for cough and chest tightness.   Gastrointestinal: Negative for nausea and abdominal pain.  Genitourinary: Negative for frequency, difficulty urinating and vaginal pain.  Musculoskeletal: Positive for back pain. Negative for gait problem.  Skin: Negative for pallor and rash.  Neurological: Negative for dizziness, tremors, weakness, numbness and headaches.  Psychiatric/Behavioral: Negative for confusion and disturbed wake/sleep cycle.       Objective:   Physical Exam  Constitutional: She appears well-developed and well-nourished. No distress.  HENT:  Head: Normocephalic.  Right Ear: External ear normal.  Left Ear: External ear normal.  Nose: Nose normal.  Mouth/Throat: Oropharynx is clear and moist.  Eyes: Conjunctivae normal are normal. Pupils are equal, round, and reactive to light. Right eye exhibits no discharge. Left eye exhibits no discharge.  Neck: Normal range of motion. Neck supple. No JVD present. No tracheal deviation present. No thyromegaly present.  Cardiovascular: Normal rate, regular rhythm and normal heart sounds.   Pulmonary/Chest: No stridor. No respiratory distress. She has no wheezes.  Abdominal: Soft. Bowel sounds are normal. She exhibits  no distension and no mass. There is no tenderness. There is no rebound and no guarding.  Musculoskeletal: She exhibits no edema and no tenderness.  Lymphadenopathy:    She has no cervical adenopathy.  Neurological: She displays normal reflexes. No cranial nerve deficit. She exhibits normal muscle tone. Coordination normal.       Thor scoliosis LS is tender  Skin: No rash noted. No erythema.  Psychiatric: She has a normal mood and affect. Her behavior is normal. Judgment and thought content normal.    Lab Results  Component Value Date   WBC 5.0 09/10/2011   HGB 12.1 09/10/2011   HCT 35.3* 09/10/2011   PLT 210.0 09/10/2011   GLUCOSE 98 09/10/2011   CHOL 265* 12/22/2010   TRIG 85.0 12/22/2010   HDL 48.10 12/22/2010   LDLDIRECT 196.7 12/22/2010   LDLCALC 111* 09/02/2009   ALT 13 12/22/2010   AST 14 12/22/2010   NA 141 09/10/2011   K 5.1 09/10/2011   CL 106 09/10/2011   CREATININE 0.93 01/23/2012   BUN 18 09/10/2011   CO2 27 09/10/2011   TSH 1.61 04/26/2010   INR 0.96 03/24/2010   HGBA1C 5.7 04/26/2010         Assessment & Plan:

## 2012-06-13 ENCOUNTER — Encounter: Payer: Self-pay | Admitting: Internal Medicine

## 2012-06-25 DIAGNOSIS — H40029 Open angle with borderline findings, high risk, unspecified eye: Secondary | ICD-10-CM | POA: Diagnosis not present

## 2012-07-15 ENCOUNTER — Other Ambulatory Visit: Payer: Self-pay | Admitting: Gynecology

## 2012-07-15 DIAGNOSIS — Z1231 Encounter for screening mammogram for malignant neoplasm of breast: Secondary | ICD-10-CM

## 2012-08-20 ENCOUNTER — Ambulatory Visit (HOSPITAL_COMMUNITY)
Admission: RE | Admit: 2012-08-20 | Discharge: 2012-08-20 | Disposition: A | Discharge status: Home / Self Care | Payer: Medicare Other | Source: Ambulatory Visit | Attending: Gynecology | Admitting: Gynecology

## 2012-08-20 DIAGNOSIS — Z1231 Encounter for screening mammogram for malignant neoplasm of breast: Secondary | ICD-10-CM | POA: Insufficient documentation

## 2012-09-08 ENCOUNTER — Other Ambulatory Visit: Payer: Self-pay

## 2012-09-08 MED ORDER — OMEPRAZOLE 20 MG PO CPDR: 20.0000 mg | DELAYED_RELEASE_CAPSULE | Freq: Every day | ORAL | Status: DC

## 2012-09-23 ENCOUNTER — Encounter: Payer: Medicare Other | Admitting: Obstetrics and Gynecology

## 2012-10-01 DIAGNOSIS — H40029 Open angle with borderline findings, high risk, unspecified eye: Secondary | ICD-10-CM | POA: Diagnosis not present

## 2012-10-02 ENCOUNTER — Ambulatory Visit (INDEPENDENT_AMBULATORY_CARE_PROVIDER_SITE_OTHER): Payer: Medicare Other | Admitting: Gynecology

## 2012-10-02 ENCOUNTER — Encounter: Payer: Self-pay | Admitting: Gynecology

## 2012-10-02 VITALS — BP 126/78 | Wt 128.0 lb

## 2012-10-02 DIAGNOSIS — M4000 Postural kyphosis, site unspecified: Secondary | ICD-10-CM

## 2012-10-02 DIAGNOSIS — M419 Scoliosis, unspecified: Secondary | ICD-10-CM

## 2012-10-02 DIAGNOSIS — M40209 Unspecified kyphosis, site unspecified: Secondary | ICD-10-CM

## 2012-10-02 DIAGNOSIS — M4120 Other idiopathic scoliosis, site unspecified: Secondary | ICD-10-CM | POA: Diagnosis not present

## 2012-10-02 DIAGNOSIS — Z8639 Personal history of other endocrine, nutritional and metabolic disease: Secondary | ICD-10-CM

## 2012-10-02 DIAGNOSIS — M810 Age-related osteoporosis without current pathological fracture: Secondary | ICD-10-CM

## 2012-10-02 DIAGNOSIS — Z780 Asymptomatic menopausal state: Secondary | ICD-10-CM | POA: Diagnosis not present

## 2012-10-02 NOTE — Progress Notes (Addendum)
Joyce Riley Feb 24, 1934 [id number]   History:    79 y.o. who has a history of osteoporosis has 2012 presented for gynecological examination today. Patient was started on Reclast in 2012. Her followup bone density study in 2013 demonstrated significant improvement of her distal one third of her left forearm her T score was monitored 0.6. The other regions of interest were unable to be utilized due to her severe kyphosis and scoliosis. She is tolerated the medication well and this coming March she is due for her third injection. We will do a followup bone density study in 2015. Patient has a past history of benign colon polyps in [place]. Her colonoscopy and [place] was reported to be normal in 2011. Her last mammogram was normal November 2013. Dr. Posey Rea is her primary physician who has been treating her for hyperlipidemia as well as vitamin D and vitamin D deficiency. See problem list and medication list in epic for further details. Patient's brother had history of gastric cancer   Past medical history,surgical history, family history and social history were all reviewed and documented in the EPIC chart.  Gynecologic History No LMP recorded. Patient is postmenopausal. Contraception: post menopausal status Last Pap: 2012. Results were: normal Last mammogram: 2013. Results were: normal  Obstetric History OB History    Grav Para Term Preterm Abortions TAB SAB Ect Mult Living   4 1 1  3  3   1      # Outc Date GA Lbr Len/2nd Wgt Sex Del Anes PTL Lv   1 TRM     F SVD  No Yes   2 SAB            3 SAB            4 SAB                ROS: A ROS was performed and pertinent positives and negatives are included in the history.  GENERAL: No fevers or chills. HEENT: No change in vision, no earache, sore throat or sinus congestion. NECK: No pain or stiffness. CARDIOVASCULAR: No chest pain or pressure. No palpitations. PULMONARY: No shortness of breath, cough or wheeze. GASTROINTESTINAL: No  abdominal pain, nausea, vomiting or diarrhea, melena or bright red blood per rectum. GENITOURINARY: No urinary frequency, urgency, hesitancy or dysuria. MUSCULOSKELETAL: No joint or muscle pain, no back pain, no recent trauma. DERMATOLOGIC: No rash, no itching, no lesions. ENDOCRINE: No polyuria, polydipsia, no heat or cold intolerance. No recent change in weight. HEMATOLOGICAL: No anemia or easy bruising or bleeding. NEUROLOGIC: No headache, seizures, numbness, tingling or weakness. PSYCHIATRIC: No depression, no loss of interest in normal activity or change in sleep pattern.     Exam: chaperone present  BP 126/78  Wt 128 lb (58.06 kg)  There is no height on file to calculate BMI.  General appearance : Well developed well nourished female. No acute distress HEENT: Neck supple, trachea midline, no carotid bruits, no thyroidmegaly Lungs: Clear to auscultation, no rhonchi or wheezes, or rib retractions  Heart: Regular rate and rhythm, no murmurs or gallops Breast:Examined in sitting and supine position were symmetrical in appearance, no palpable masses or tenderness,  no skin retraction, no nipple inversion, no nipple discharge, no skin discoloration, no axillary or supraclavicular lymphadenopathy Abdomen: no palpable masses or tenderness, no rebound or guarding Extremities: no edema or skin discoloration or tenderness  Pelvic:  Bartholin, Urethra, Skene Glands: Within normal limits  Vagina: No gross lesions or discharge, atrophic changes, first degree rectocele, first degree cystocele  Cervix: No gross lesions or discharge  Uterus  axial, normal size, shape and consistency, non-tender and mobile  Adnexa  Without masses or tenderness  Anus and perineum  normal   Rectovaginal  normal sphincter tone without palpated masses or tenderness             Hemoccult cards provided     Assessment/Plan:  79 y.o. female with history of osteoporosis who had been started on Reclast in 2012  patient will be schedule for her next injection March of 2014. Since she is going to be seeing Dr. Posey Rea for her lab work in the next 2 weeks I will ask him to check her calcium level as well as vitamin D and PTH and serum creatinine before the infusion. She was given literature information [place] on self breast examination as well as on osteoporosis. No Pap smear done today new guidelines discussed. Since patient has never had any abnormal Pap smears and is now greater than 15 years of age she will no longer need Pap smears. Hemoccult cards were provided her to symmetrical the office for testing. She is due for mammogram later this year.   Ok Edwards MD, 5:13 PM 10/02/2012

## 2012-10-02 NOTE — Patient Instructions (Signed)
?????????? ?? ?????????? ????? (Breast Self-Awareness) ?????????? ?? ?????????? ????? ????? ??????? ???????? ?? ?????? ??????, ?? ????????? ?????????? ?, ????????, ????? ?????? ??? ?????. ??????? ?????????? ?? ?????????? ?????, ?? ?????? ????? ????? ??????, ??? ???????? ???? ?????, ? ?????? ?????? ?????????? ???????? ?? ?????????. ??? ???????? ??? ???????? ????????? ?? ?????? ??????. ??? ????? ???????? ??? ??????????? ???? ????????? ? ???, ??? ???????? ???????? ?????. ????? ?? ???????? ??????, ??? ???????? ?????? ??? ?????, ???????? ?????????? ???????????????? ??????? ?????.  ???? ?? ????????? ???????????, ???????? ????? ?? ????, ??? ???? ??????????????? ?????????? ?? ????? ? ?????. ?????? ?????????? ???????, ? ??????? ???? ???????? ?????: ????????? ?? ??????, ???????? ???? ? ??? ???? ????? ?????, ????????? ??????????? ???? ??? ???????????, ??????? ?????, ??? ???? ??????? ?????????? (??????), ????? ?????????? ? ???????????????. ???? ? ????? ????? ??????? ? ????? ???????? ?????? (??????????????? ???????), ?? ? ??? ????? ?????????? ???????? ?????.  ??????????????? ?????? ?????  ?????? ????? ??? ??????? ????? -- ????? 5-7 ???? ????? ????????? ????????? ???????????. ?? ????? ??????????? ????? ??????? ???????????, ??-?? ???? ??? ????? ??????? ???????? ?????-???? ?????????. ???? ? ??? ?? ???? ???????????, ????????? ?????????, ??? ??? ???? ??????? ????? (?????????????), ??? ???? ????????? ?????? ????? ? ???? ? ?? ?? ????? ????? ?????????? ?????????, ????????, ??????????. ???? ?? ??????? ??????, ???????????? ????? ????? ????????? ??? ????? ????????????? ????????????. ???????? ????? ?? ????????? ???? ??????????? ???????, ?.?. ?? ?????? ?????????? ??????????????? ??????? ?????. ??????? ? ???????? ?????, ??? ????????? ???????? ??????? ?? ?????? ???????? ??????. ????????? ??????????? ????? ? ?????????? ???????. ? ???????? ??????? ?? ?????????? ? ???????????? ???????? ?????, ? ??? ?????? ????? ????????? ??????. ?????  ??????????? ??????? ??? ?????????? ????????? ?? ?????.    ????????? ?????. ???????? ????? ????????. ????????? ?????? ???? ?????? ????????. ??????? ???? ?? ?????, ????????? ?????? ????. ????????? ???????? ?? ???????? ?????, ??????? ? ???????? ???????? ????? (??????????). ? ?????????? ????? ????????? ???????, ??????? ??? ???????. ????? ????????? ???????? ?? ????????? ?????? ????????, ????????, ?? ??????????? ??? ????????? ????. ????? ????? ????????? ???????? ???? ??????, ????????, ????????? ?? ??????, ???????, ????????? ?????, ???????????.  ????????? ???????? ?????. ??? ????? ????? ?????? ? ???? ??? ? ??????, ???? ??? ????? ??? ???? ?? ?????. ????????? ???? ?? ??????? ????????????? ???????? ?????? ? ???????? ???? ?? ??????. ?????? ???????? ???????? (?? ????????? ???????) ?????? ???? ???????? ?????. ??????? ? ???????? ? ????????? ?????????? ? 3/4 ????? (2 ??), ?????????????? ???? ?????, ?????????? ?????. ??? ?????? ???????? ???????? ????????????? ??? ?????? ?????? ???????? (??????, ??????? ? ???????). ?????? ???????? -- ??? ???????????? ??????, ????????????? ????? ??? ?????. ??????? ???????? ??????? ????????? ?????, ????????????? ??????? ??????, ? ??????? ???????? ?????????? ??? ???????????? ??????, ????????????? ????? ? ??????. ??????????? ?????? ????????? ?????????????? ???? ????? ?????????? ?? ??????????? ?????? ????, ???? ?? ??????? ?? ????? ??? ??????. ????? ??????? ?????? ?????????? ??????? ?? ?????????? ????? ?????? ?????? ?? ??????? ???????. ??????????? ????????? ???????? ???????? ? 3/4 ????? (2 ??), ????????????? ???? ?????, ?????????? ??????????? ????? ?? ??????? (???????) ? ????????? ???. ??????????? ?????? ????????? ????????? ????? ? ???? ?? ???????? ??????, ???????? ??? ??? ???? ????????. ????????? ????????? ? ?????? ??????, ??????????? ???????? ????? ?????????? ??? ?????????.  ?????? ?????? ???? ???????????? ?????????, ? ????? ?????????? ???????????. ?????? ???????? ??? ?? ?????????? ?????? ??  ??????, ????? ??????? ??? ????????? ????????, ????????? ??? ???????????? ??????????. ???? ? ??? ???????????? ????????????? ????, ?????????? ? ??????? ???? ????????? ?????????? ? ????????? ?????? ????????? ???????????.  ? ?????? ???????? ????? ????? ?????????????? ????? ??? ??????????. ?????? ??? ??????? ?????????? ? ?????, ???? ?? ?????????? ????? ????????, ??????? ???????? ? ??? ????????????.   ?????????? ? ?????, ????:   ?? ???????? ????????? ?????, ???????? ??? ??????? ???????? ????? ??? ??????.   ????????? ????????? ?????? ????????, ????????, ?? ??????????? ??? ????????? ????.   ????????? ?????????? ????????? ?? ??????.   ?? ????? ????? ????? ??? ?????????? ? ?????.  Document Released: 09/10/2005 Document Revised: 03/11/2012 [employer] Patient Information 2013 [employer], [place].  ?????????? ([employer])  ? ??????? ???? ????? ???????? ???????? ????????? ????????? ?????? ?????? ???????. ?? ???? ???????? ??????? ?????????? ???????????. ? 30 ????? ? ??????????? ????? ?????????? ??????????? ?????? ??????? ????? ??-?? ?????????? ????? ????????? ??????? ?????? ?????? ? ?????????? ?????. ???? ???? ??????? ?????? ??????? ?????, ??? ??????. ?????? ??????? ????? ?????? ????????????? ?????? ????????? ??????? ?????? ???????????, ??? ??????????.  ?????????? ?????? ?? ????????? ? ????????????? ?????? ???????. ?????????? ???????? ?????? ?????? ???????, ??????? ??????, ????????, ?????? ????, ?????????? ???????? ???? ? ??????????? ?????????. ?? ???? ?????? ??????? ????? ????? ?????????? ????? ????????. ??? ??????, ??? ??????? ?????? ?????? ?????????? ??? ???????, ? ?????? ????? ????????? ?????????? ??????. ??? ????? ? ?????????? ??????? ????. ????? ??????? ???????? ???????????? ????? ????? ????????? ????????, ??? ??? ???????? (???????) ???? ??-?? ?????????????? ??????, ????????, ??-?? ???????? ???????.  ???????  ????????? ??????? ????????? ? ????????? ???????????:   ???????.  ???????????? ????? 2 ??????  ???????? ? ???? ????????? ??? ? ??????.  ??????????????? ????? ????????? ????????:  ????????????????.  ???????? ??? ????????????.  ?????????? ??? ??????? ?????????? ??????.  ????????????????????? ???????.  ??????????, ??????????? ??????? ???????.  ????????, ??????????? ???????? ???????.  ????????.  ?????????? ?????????? ????????.  ?????????? ?????????? ?????. ??? ????? ???????? ? ???????? ???????? D.  ????????? ????????? ? ???????:  ????????? ?????????????? ??????????? ?????????, ????????, ??????? ????? ? ???????? ?????.  ??????.  ???????????.  ???????????????. ??????? ?????  ?????????? ????? ????????? ? ?????? ????????. ?????? ????????????? ??????? ????? ????? ????????? ??????????? ???????? ???????????:   ???. ??????? ?????????? ???????? ?????.  ???????. ???? ?????? 50 ?????????? ???????? ?????.  ???????? ??????????????. ????, ????????????? ???????????? ? ????????? ?????, ?????????? ???????? ?????.  ????????????????. ???? ? ?????? ???????? ???????????? ???? ??????????, ?? ????? ?????????? ? ?????? ?????. ????????  ?????? ? ??????? ???????????? ??? ??????????? ????????? ???????????.  ???????  ???????? ???????????, ??????? ?? ????? ???????????? ??????? ????? ??????? ? ????? ?????????? ?? ??????????, ???????? ? ????:   ?????????? ?????. ??? ?????? ?????????? ??????? ?????? ???????????? (?????????) ??-?? ????, ??? ??? ??????? ? ? ??? ????????? ???????.  ????? ??????? ????? ???????????? (?????). ??? ????????????? ???????? ??? ??????? ???? ????? ????????? ??????????? ????????????, ?? ????? ???????? ????? ??????? ?????????? 2 ????? ??????????? ?????????????? ?????? ? ??????? ???????? ??????? ??? ????, ????? ???????? ??????????? ????????? ??????? ????? (?????????????????? ????????????? ??????????????? [DXA - ???? ????????????]). ????? ??????? ???? ????? ????????? ??????, ????? ?????????? ???????????? ???????? D.  ???????  ???? ??????? ??????????? - ???????? ????? ??? ????,  ????? ????????? ???? ?????????. ?????????? ?????? ???? ????????????? ?????????? ??? ?????????? ???? ????. ????????? ????????? ????????? ??????? ?????? ??????? ?????. ?????? ???????? ??????????? ????????? ??????? ?????. ??????? ????? ????????????? ??????????? ??????? ?????? ??????? ? ???????? D ? ?????????.  ????????????  ?? ?????? ??????????? ??? ????? ?? ???????????? ???????????. ??????????? ??????????? ?????????? ?????? ??????? ? ???????? D ? ???????? ????? ?????? ??? ??????? ??????????? ??????????? ????????? ?????. ?????????? ?????????? ???????? ????? ????? ??????, ???????? ??????????, ???????????? ?? ???????????????? ?????????, ? ????? ??????????? ?????????? ??????????. ???? ?? ??????, ????? ?? ??????? ???????? ?????? ???????????? ???????????? ???????????.  ?????????, ??? ??:   ????????? ????????? ??????????.  ?????? ?????????????? ??????? ?? ??????????.  ??????????????? ?????????? ? ?????, ???? ??? ?? ?????????? ????? ??? ?????????? ????. Document Released: 06/20/2005 Document Revised: 12/03/2011 [facility] Patient Information 2013 [place], [place].

## 2012-10-10 ENCOUNTER — Other Ambulatory Visit (INDEPENDENT_AMBULATORY_CARE_PROVIDER_SITE_OTHER): Payer: Medicare Other

## 2012-10-10 ENCOUNTER — Encounter: Payer: Self-pay | Admitting: Internal Medicine

## 2012-10-10 ENCOUNTER — Ambulatory Visit (INDEPENDENT_AMBULATORY_CARE_PROVIDER_SITE_OTHER): Payer: Medicare Other | Admitting: Internal Medicine

## 2012-10-10 VITALS — BP 110/70 | HR 76 | Temp 98.0°F | Resp 16 | Wt 126.0 lb

## 2012-10-10 DIAGNOSIS — E559 Vitamin D deficiency, unspecified: Secondary | ICD-10-CM | POA: Diagnosis not present

## 2012-10-10 DIAGNOSIS — M545 Low back pain, unspecified: Secondary | ICD-10-CM | POA: Diagnosis not present

## 2012-10-10 DIAGNOSIS — M810 Age-related osteoporosis without current pathological fracture: Secondary | ICD-10-CM | POA: Diagnosis not present

## 2012-10-10 DIAGNOSIS — K219 Gastro-esophageal reflux disease without esophagitis: Secondary | ICD-10-CM

## 2012-10-10 DIAGNOSIS — R05 Cough: Secondary | ICD-10-CM

## 2012-10-10 DIAGNOSIS — E538 Deficiency of other specified B group vitamins: Secondary | ICD-10-CM | POA: Diagnosis not present

## 2012-10-10 DIAGNOSIS — R059 Cough, unspecified: Secondary | ICD-10-CM

## 2012-10-10 DIAGNOSIS — R42 Dizziness and giddiness: Secondary | ICD-10-CM | POA: Diagnosis not present

## 2012-10-10 LAB — BASIC METABOLIC PANEL
BUN: 14 mg/dL (ref 6–23)
Calcium: 8.9 mg/dL (ref 8.4–10.5)
Creatinine, Ser: 0.9 mg/dL (ref 0.4–1.2)
GFR: 61.46 mL/min (ref >60.00)
Glucose, Bld: 100 mg/dL — ABNORMAL HIGH (ref 70–99)
Sodium: 137 mEq/L (ref 135–145)

## 2012-10-10 LAB — URINALYSIS, ROUTINE W REFLEX MICROSCOPIC
Ketones, ur: NEGATIVE
Leukocytes, UA: NEGATIVE
Specific Gravity, Urine: 1.015 (ref 1.000–1.030)
Urobilinogen, UA: 0.2 (ref 0.0–1.0)

## 2012-10-10 MED ORDER — OMEPRAZOLE 20 MG PO CPDR: 20.0000 mg | DELAYED_RELEASE_CAPSULE | Freq: Every day | ORAL | Status: DC

## 2012-10-10 NOTE — Progress Notes (Signed)
   Subjective:    HPI   The patient is here to follow up on chronic  anxiety,  B12 and Vit D def, scoliosis/LBP symptoms controlled with medicines, diet and exercise. C/o cough x 2-3 wks, dry  BP Readings from Last 3 Encounters:  10/10/12 110/70  10/02/12 126/78  06/09/12 120/76   Wt Readings from Last 3 Encounters:  10/10/12 126 lb (57.153 kg)  10/02/12 128 lb (58.06 kg)  06/09/12 122 lb (55.339 kg)        Review of Systems  Constitutional: Negative for chills, activity change, appetite change, fatigue and unexpected weight change.  HENT: Negative for congestion, mouth sores and sinus pressure.   Eyes: Negative for visual disturbance.  Respiratory: Negative for cough and chest tightness.   Gastrointestinal: Negative for nausea and abdominal pain.  Genitourinary: Negative for frequency, difficulty urinating and vaginal pain.  Musculoskeletal: Positive for back pain. Negative for gait problem.  Skin: Negative for pallor and rash.  Neurological: Negative for dizziness, tremors, weakness, numbness and headaches.  Psychiatric/Behavioral: Negative for confusion and sleep disturbance.       Objective:   Physical Exam  Constitutional: She appears well-developed and well-nourished. No distress.  HENT:  Head: Normocephalic.  Right Ear: External ear normal.  Left Ear: External ear normal.  Nose: Nose normal.  Mouth/Throat: Oropharynx is clear and moist.  Eyes: Conjunctivae normal are normal. Pupils are equal, round, and reactive to light. Right eye exhibits no discharge. Left eye exhibits no discharge.  Neck: Normal range of motion. Neck supple. No JVD present. No tracheal deviation present. No thyromegaly present.  Cardiovascular: Normal rate, regular rhythm and normal heart sounds.   Pulmonary/Chest: No stridor. No respiratory distress. She has no wheezes.  Abdominal: Soft. Bowel sounds are normal. She exhibits no distension and no mass. There is no tenderness. There is no  rebound and no guarding.  Musculoskeletal: She exhibits no edema and no tenderness.  Lymphadenopathy:    She has no cervical adenopathy.  Neurological: She displays normal reflexes. No cranial nerve deficit. She exhibits normal muscle tone. Coordination normal.       Thor scoliosis LS is tender  Skin: No rash noted. No erythema.  Psychiatric: She has a normal mood and affect. Her behavior is normal. Judgment and thought content normal.    Lab Results  Component Value Date   WBC 5.0 09/10/2011   HGB 12.1 09/10/2011   HCT 35.3* 09/10/2011   PLT 210.0 09/10/2011   GLUCOSE 98 09/10/2011   CHOL 265* 12/22/2010   TRIG 85.0 12/22/2010   HDL 48.10 12/22/2010   LDLDIRECT 196.7 12/22/2010   LDLCALC 111* 09/02/2009   ALT 13 12/22/2010   AST 14 12/22/2010   NA 141 09/10/2011   K 5.1 09/10/2011   CL 106 09/10/2011   CREATININE 0.93 01/23/2012   BUN 18 09/10/2011   CO2 27 09/10/2011   TSH 1.61 04/26/2010   INR 0.96 03/24/2010   HGBA1C 5.7 04/26/2010         Assessment & Plan:

## 2012-10-10 NOTE — Assessment & Plan Note (Signed)
Continue with current prescription therapy as reflected on the Med list.  

## 2012-10-10 NOTE — Patient Instructions (Addendum)
??????????  (  Osteoporosis)  ? ??????? ???? ????? ???????? ???????? ????????? ????????? ?????? ?????? ???????. ?? ???? ???????? ??????? ?????????? ???????????. ? 30 ????? ? ??????????? ????? ?????????? ??????????? ?????? ??????? ????? ??-?? ?????????? ????? ????????? ??????? ?????? ?????? ? ?????????? ?????. ???? ???? ??????? ?????? ??????? ?????, ??? ??????. ?????? ??????? ????? ?????? ????????????? ?????? ????????? ??????? ?????? ???????????, ??? ??????????.  ?????????? ?????? ?? ????????? ? ????????????? ?????? ???????. ?????????? ???????? ?????? ?????? ???????, ??????? ??????, ????????, ?????? ????, ?????????? ???????? ???? ? ??????????? ?????????. ?? ???? ?????? ??????? ????? ????? ?????????? ????? ????????. ??? ??????, ??? ??????? ?????? ?????? ?????????? ??? ???????, ? ?????? ????? ????????? ?????????? ??????. ??? ????? ? ?????????? ??????? ????. ????? ??????? ???????? ???????????? ????? ????? ????????? ????????, ??? ??? ???????? (???????) ???? ??-?? ?????????????? ??????, ????????, ??-?? ???????? ???????.  ???????  ????????? ??????? ????????? ? ????????? ???????????:  ???????.  ???????????? ????? 2 ?????? ???????? ? ???? ????????? ??? ? ??????.  ??????????????? ????? ????????? ????????:  ????????????????.  ???????? ??? ????????????.  ?????????? ??? ??????? ?????????? ??????.  ????????????????????? ???????.  ??????????, ??????????? ??????? ???????.  ????????, ??????????? ???????? ???????.  ????????.  ?????????? ?????????? ????????.  ?????????? ?????????? ?????. ??? ????? ???????? ? ???????? ???????? D.  ????????? ????????? ? ???????:  ????????? ?????????????? ??????????? ?????????, ????????, ??????? ????? ? ???????? ?????.  ??????.  ???????????.  ???????????????. ??????? ?????  ?????????? ????? ????????? ? ?????? ????????. ?????? ????????????? ??????? ????? ????? ????????? ??????????? ???????? ???????????:  ???. ??????? ?????????? ???????? ?????.  ???????. ???? ?????? 50 ??????????  ???????? ?????.  ???????? ??????????????. ????, ????????????? ???????????? ? ????????? ?????, ?????????? ???????? ?????.  ????????????????. ???? ? ?????? ???????? ???????????? ???? ??????????, ?? ????? ?????????? ? ?????? ?????. ????????  ?????? ? ??????? ???????????? ??? ??????????? ????????? ???????????.  ???????  ???????? ???????????, ??????? ?? ????? ???????????? ??????? ????? ??????? ? ????? ?????????? ?? ??????????, ???????? ? ????:  ?????????? ?????. ??? ?????? ?????????? ??????? ?????? ???????????? (?????????) ??-?? ????, ??? ??? ??????? ? ? ??? ????????? ???????.  ????? ??????? ????? ???????????? (?????). ??? ????????????? ???????? ??? ??????? ???? ????? ????????? ??????????? ????????????, ?? ????? ???????? ????? ??????? ?????????? 2 ????? ??????????? ?????????????? ?????? ? ??????? ???????? ??????? ??? ????, ????? ???????? ??????????? ????????? ??????? ????? (?????????????????? ????????????? ??????????????? [DXA - ???? ????????????]). ????? ??????? ???? ????? ????????? ??????, ????? ?????????? ???????????? ???????? D.  ???????  ???? ??????? ??????????? - ???????? ????? ??? ????, ????? ????????? ???? ?????????. ?????????? ?????? ???? ????????????? ?????????? ??? ?????????? ???? ????. ????????? ????????? ????????? ??????? ?????? ??????? ?????. ?????? ???????? ??????????? ????????? ??????? ?????. ??????? ????? ????????????? ??????????? ??????? ?????? ??????? ? ???????? D ? ?????????.  ????????????  ?? ?????? ??????????? ??? ????? ?? ???????????? ???????????. ??????????? ??????????? ?????????? ?????? ??????? ? ???????? D ? ???????? ????? ?????? ??? ??????? ??????????? ??????????? ????????? ?????. ?????????? ?????????? ???????? ????? ????? ??????, ???????? ??????????, ???????????? ?? ???????????????? ?????????, ? ????? ??????????? ?????????? ??????????. ???? ?? ??????, ????? ?? ??????? ???????? ?????? ???????????? ???????????? ???????????.  ?????????, ??? ??:  ????????? ????????? ??????????.    ?????? ?????????????? ??????? ?? ??????????.  ??????????????? ?????????? ? ?????, ???? ??? ?? ?????????? ????? ??? ?????????? ????. Document Released: 06/20/2005 Document Revised: 12/03/2011  [facility] Patient Information 2013 [place], [place].    Body pillows for comfort ad back pain

## 2012-10-10 NOTE — Assessment & Plan Note (Signed)
Continue with current prescription therapy as reflected on the Med list. prn 

## 2012-10-10 NOTE — Assessment & Plan Note (Signed)
except

## 2012-10-10 NOTE — Assessment & Plan Note (Signed)
1/14 post-viral Delsym Call if not resolved

## 2012-10-13 LAB — PTH, INTACT AND CALCIUM: PTH: 45.6 pg/mL (ref 14.0–72.0)

## 2012-11-05 ENCOUNTER — Telehealth: Payer: Self-pay | Admitting: *Deleted

## 2012-11-05 NOTE — Telephone Encounter (Signed)
Omeprazole PA is approved until 09/24/2023. Pharmacy informed.

## 2012-11-12 DIAGNOSIS — H40029 Open angle with borderline findings, high risk, unspecified eye: Secondary | ICD-10-CM | POA: Diagnosis not present

## 2013-02-06 ENCOUNTER — Ambulatory Visit (INDEPENDENT_AMBULATORY_CARE_PROVIDER_SITE_OTHER): Payer: Medicare Other | Admitting: Internal Medicine

## 2013-02-06 ENCOUNTER — Encounter: Payer: Self-pay | Admitting: Internal Medicine

## 2013-02-06 VITALS — BP 128/80 | HR 80 | Temp 98.1°F | Resp 16 | Wt 130.0 lb

## 2013-02-06 DIAGNOSIS — M4120 Other idiopathic scoliosis, site unspecified: Secondary | ICD-10-CM

## 2013-02-06 DIAGNOSIS — M545 Low back pain, unspecified: Secondary | ICD-10-CM

## 2013-02-06 DIAGNOSIS — E538 Deficiency of other specified B group vitamins: Secondary | ICD-10-CM | POA: Diagnosis not present

## 2013-02-06 DIAGNOSIS — K219 Gastro-esophageal reflux disease without esophagitis: Secondary | ICD-10-CM

## 2013-02-06 DIAGNOSIS — M810 Age-related osteoporosis without current pathological fracture: Secondary | ICD-10-CM

## 2013-02-06 DIAGNOSIS — R1013 Epigastric pain: Secondary | ICD-10-CM

## 2013-02-06 MED ORDER — ZOLEDRONIC ACID 5 MG/100ML IV SOLN: 5.0000 mg | Freq: Once | INTRAVENOUS | Status: DC

## 2013-02-06 MED ORDER — OMEPRAZOLE 20 MG PO CPDR: 20.0000 mg | DELAYED_RELEASE_CAPSULE | Freq: Every day | ORAL | Status: DC

## 2013-02-06 NOTE — Assessment & Plan Note (Signed)
Doing well 

## 2013-02-06 NOTE — Assessment & Plan Note (Signed)
Continue with current prescription therapy as reflected on the Med list.  

## 2013-02-06 NOTE — Assessment & Plan Note (Signed)
Reclast Labs per gyn prior

## 2013-02-06 NOTE — Assessment & Plan Note (Signed)
No charge. 

## 2013-02-06 NOTE — Progress Notes (Signed)
   Subjective:    HPI   The patient is here to follow up on chronic  anxiety,  B12 and Vit D def, scoliosis/LBP, GERD symptoms controlled with medicines, diet and exercise.   BP Readings from Last 3 Encounters:  02/06/13 128/80  10/10/12 110/70  10/02/12 126/78   Wt Readings from Last 3 Encounters:  02/06/13 130 lb (58.968 kg)  10/10/12 126 lb (57.153 kg)  10/02/12 128 lb (58.06 kg)        Review of Systems  Constitutional: Negative for chills, activity change, appetite change, fatigue and unexpected weight change.  HENT: Negative for congestion, mouth sores and sinus pressure.   Eyes: Negative for visual disturbance.  Respiratory: Negative for cough and chest tightness.   Gastrointestinal: Negative for nausea and abdominal pain.  Genitourinary: Negative for frequency, difficulty urinating and vaginal pain.  Musculoskeletal: Positive for back pain. Negative for gait problem.  Skin: Negative for pallor and rash.  Neurological: Negative for dizziness, tremors, weakness, numbness and headaches.  Psychiatric/Behavioral: Negative for confusion and sleep disturbance.       Objective:   Physical Exam  Constitutional: She appears well-developed and well-nourished. No distress.  HENT:  Head: Normocephalic.  Right Ear: External ear normal.  Left Ear: External ear normal.  Nose: Nose normal.  Mouth/Throat: Oropharynx is clear and moist.  Eyes: Conjunctivae are normal. Pupils are equal, round, and reactive to light. Right eye exhibits no discharge. Left eye exhibits no discharge.  Neck: Normal range of motion. Neck supple. No JVD present. No tracheal deviation present. No thyromegaly present.  Cardiovascular: Normal rate, regular rhythm and normal heart sounds.   Pulmonary/Chest: No stridor. No respiratory distress. She has no wheezes.  Abdominal: Soft. Bowel sounds are normal. She exhibits no distension and no mass. There is no tenderness. There is no rebound and no guarding.   Musculoskeletal: She exhibits no edema and no tenderness.  Lymphadenopathy:    She has no cervical adenopathy.  Neurological: She displays normal reflexes. No cranial nerve deficit. She exhibits normal muscle tone. Coordination normal.  Thor scoliosis LS is tender  Skin: No rash noted. No erythema.  Psychiatric: She has a normal mood and affect. Her behavior is normal. Judgment and thought content normal.    Lab Results  Component Value Date   WBC 5.0 09/10/2011   HGB 12.1 09/10/2011   HCT 35.3* 09/10/2011   PLT 210.0 09/10/2011   GLUCOSE 100* 10/10/2012   CHOL 265* 12/22/2010   TRIG 85.0 12/22/2010   HDL 48.10 12/22/2010   LDLDIRECT 196.7 12/22/2010   LDLCALC 111* 09/02/2009   ALT 13 12/22/2010   AST 14 12/22/2010   NA 137 10/10/2012   K 4.2 10/10/2012   CL 103 10/10/2012   CREATININE 0.9 10/10/2012   BUN 14 10/10/2012   CO2 28 10/10/2012   TSH 1.61 04/26/2010   INR 0.96 03/24/2010   HGBA1C 5.7 04/26/2010         Assessment & Plan:

## 2013-02-11 ENCOUNTER — Telehealth: Payer: Self-pay | Admitting: *Deleted

## 2013-02-11 DIAGNOSIS — M810 Age-related osteoporosis without current pathological fracture: Secondary | ICD-10-CM

## 2013-02-11 NOTE — Telephone Encounter (Signed)
I called pts daughter as it is time for the pt to receive Reclast. She will call me tomorrow when she is at her calendar. KW

## 2013-02-12 NOTE — Telephone Encounter (Signed)
Pt will come on 02/17/13 830 for labs, once I get a normal result I will set up Reclast for week of June 2. KW

## 2013-02-17 ENCOUNTER — Other Ambulatory Visit: Payer: Medicare Other

## 2013-02-17 DIAGNOSIS — M810 Age-related osteoporosis without current pathological fracture: Secondary | ICD-10-CM | POA: Diagnosis not present

## 2013-02-23 NOTE — Telephone Encounter (Signed)
Order faxed. KW

## 2013-02-23 NOTE — Telephone Encounter (Signed)
Pt informed apt 02/27/13 at 1pm at [facility], [place] interpreter requested. Instructions mailed to pt to remind her Tylenol ES tid starting the day before the infusion for a total of 3 days. KW

## 2013-02-26 ENCOUNTER — Other Ambulatory Visit (HOSPITAL_COMMUNITY): Payer: Self-pay | Admitting: *Deleted

## 2013-02-27 ENCOUNTER — Ambulatory Visit (HOSPITAL_COMMUNITY)
Admission: RE | Admit: 2013-02-27 | Discharge: 2013-02-27 | Disposition: A | Discharge status: Home / Self Care | Payer: Medicare Other | Source: Ambulatory Visit | Attending: Gynecology | Admitting: Gynecology

## 2013-02-27 DIAGNOSIS — M4120 Other idiopathic scoliosis, site unspecified: Secondary | ICD-10-CM | POA: Diagnosis not present

## 2013-02-27 DIAGNOSIS — K219 Gastro-esophageal reflux disease without esophagitis: Secondary | ICD-10-CM | POA: Insufficient documentation

## 2013-02-27 DIAGNOSIS — E538 Deficiency of other specified B group vitamins: Secondary | ICD-10-CM | POA: Insufficient documentation

## 2013-02-27 DIAGNOSIS — M810 Age-related osteoporosis without current pathological fracture: Secondary | ICD-10-CM | POA: Diagnosis not present

## 2013-02-27 DIAGNOSIS — M545 Low back pain, unspecified: Secondary | ICD-10-CM | POA: Insufficient documentation

## 2013-02-27 DIAGNOSIS — Z780 Asymptomatic menopausal state: Secondary | ICD-10-CM | POA: Diagnosis not present

## 2013-02-27 MED ORDER — ZOLEDRONIC ACID 5 MG/100ML IV SOLN
INTRAVENOUS | Status: AC
  Filled 2013-02-27: qty 100

## 2013-02-27 MED ORDER — ZOLEDRONIC ACID 5 MG/100ML IV SOLN
5.0000 mg | Freq: Once | INTRAVENOUS | Status: AC
  Administered 2013-02-27: 5 mg via INTRAVENOUS

## 2013-02-27 NOTE — Progress Notes (Signed)
Reviewed allergy comment that is listed on chart with Epifania Gore, pharmacist. Rip Harbour to give Reclast as ordered. Patient is aware that she can take Tylenol if she has side effects (Through interpreter)

## 2013-04-28 DIAGNOSIS — Z86010 Personal history of colonic polyps: Secondary | ICD-10-CM | POA: Diagnosis not present

## 2013-04-28 DIAGNOSIS — R152 Fecal urgency: Secondary | ICD-10-CM | POA: Diagnosis not present

## 2013-04-28 DIAGNOSIS — R141 Gas pain: Secondary | ICD-10-CM | POA: Diagnosis not present

## 2013-04-29 DIAGNOSIS — H26499 Other secondary cataract, unspecified eye: Secondary | ICD-10-CM | POA: Diagnosis not present

## 2013-04-29 DIAGNOSIS — H35379 Puckering of macula, unspecified eye: Secondary | ICD-10-CM | POA: Diagnosis not present

## 2013-04-29 DIAGNOSIS — H43399 Other vitreous opacities, unspecified eye: Secondary | ICD-10-CM | POA: Diagnosis not present

## 2013-04-29 DIAGNOSIS — H40029 Open angle with borderline findings, high risk, unspecified eye: Secondary | ICD-10-CM | POA: Diagnosis not present

## 2013-05-28 ENCOUNTER — Encounter: Payer: Self-pay | Admitting: Internal Medicine

## 2013-05-28 ENCOUNTER — Ambulatory Visit (INDEPENDENT_AMBULATORY_CARE_PROVIDER_SITE_OTHER): Payer: Medicare Other | Admitting: Internal Medicine

## 2013-05-28 VITALS — BP 118/60 | HR 60 | Temp 98.8°F | Resp 16 | Wt 129.0 lb

## 2013-05-28 DIAGNOSIS — M545 Low back pain, unspecified: Secondary | ICD-10-CM | POA: Diagnosis not present

## 2013-05-28 DIAGNOSIS — L500 Allergic urticaria: Secondary | ICD-10-CM | POA: Diagnosis not present

## 2013-05-28 DIAGNOSIS — E538 Deficiency of other specified B group vitamins: Secondary | ICD-10-CM | POA: Diagnosis not present

## 2013-05-28 DIAGNOSIS — K219 Gastro-esophageal reflux disease without esophagitis: Secondary | ICD-10-CM | POA: Diagnosis not present

## 2013-05-28 MED ORDER — LORATADINE 10 MG PO TABS: 10.0000 mg | ORAL_TABLET | Freq: Every day | ORAL | Status: DC | PRN

## 2013-05-28 MED ORDER — RANITIDINE HCL 150 MG PO TABS: 150.0000 mg | ORAL_TABLET | Freq: Every day | ORAL | Status: DC

## 2013-05-28 MED ORDER — METHYLPREDNISOLONE ACETATE 40 MG/ML IJ SUSP
40.0000 mg | Freq: Once | INTRAMUSCULAR | Status: AC
  Administered 2013-05-28: 40 mg via INTRAMUSCULAR

## 2013-05-28 MED ORDER — TRIAMCINOLONE ACETONIDE 0.5 % EX CREA: TOPICAL_CREAM | Freq: Three times a day (TID) | CUTANEOUS | Status: DC

## 2013-05-28 NOTE — Patient Instructions (Addendum)
   Stop Omeprazole Start Zantac (Ranitidine) instead Depomedrol 40 mg im given Claritin 10 mg a day for rash Triamc 0.5 % on rash   Gluten free trial (no wheat products) for 4-6 weeks. OK to use gluten-free bread and gluten-free pasta.  Milk free trial (no milk, ice cream, cheese and yogurt) for 4-6 weeks. OK to use almond, coconut, rice or soy milk.

## 2013-05-28 NOTE — Progress Notes (Signed)
   Subjective:    Rash This is a new problem. The current episode started yesterday. The problem has been gradually improving since onset. Pertinent negatives include no congestion, cough or fatigue. The treatment provided mild relief.     The patient is here to follow up on chronic  anxiety,  B12 and Vit D def, scoliosis/LBP, GERD symptoms controlled with medicines, diet and exercise.   BP Readings from Last 3 Encounters:  05/28/13 118/60  02/27/13 142/76  02/06/13 128/80   Wt Readings from Last 3 Encounters:  05/28/13 129 lb (58.514 kg)  02/27/13 128 lb (58.06 kg)  02/06/13 130 lb (58.968 kg)        Review of Systems  Constitutional: Negative for chills, activity change, appetite change, fatigue and unexpected weight change.  HENT: Negative for congestion, mouth sores and sinus pressure.   Eyes: Negative for visual disturbance.  Respiratory: Negative for cough and chest tightness.   Gastrointestinal: Negative for nausea and abdominal pain.       Stool incontinence - normal  Genitourinary: Negative for frequency, difficulty urinating and vaginal pain.  Musculoskeletal: Positive for back pain. Negative for gait problem.  Skin: Negative for pallor and rash.  Neurological: Negative for dizziness, tremors, weakness, numbness and headaches.  Psychiatric/Behavioral: Negative for confusion and sleep disturbance.       Objective:   Physical Exam  Constitutional: She appears well-developed and well-nourished. No distress.  HENT:  Head: Normocephalic.  Right Ear: External ear normal.  Left Ear: External ear normal.  Nose: Nose normal.  Mouth/Throat: Oropharynx is clear and moist.  Eyes: Conjunctivae are normal. Pupils are equal, round, and reactive to light. Right eye exhibits no discharge. Left eye exhibits no discharge.  Neck: Normal range of motion. Neck supple. No JVD present. No tracheal deviation present. No thyromegaly present.  Cardiovascular: Normal rate, regular  rhythm and normal heart sounds.   Pulmonary/Chest: No stridor. No respiratory distress. She has no wheezes.  Abdominal: Soft. Bowel sounds are normal. She exhibits no distension and no mass. There is no tenderness. There is no rebound and no guarding.  Musculoskeletal: She exhibits no edema and no tenderness.  Lymphadenopathy:    She has no cervical adenopathy.  Neurological: She displays normal reflexes. No cranial nerve deficit. She exhibits normal muscle tone. Coordination normal.  Thor scoliosis LS is tender  Skin: Rash noted. No erythema.  eryth exema-like rash  Psychiatric: She has a normal mood and affect. Her behavior is normal. Judgment and thought content normal.    Lab Results  Component Value Date   WBC 5.0 09/10/2011   HGB 12.1 09/10/2011   HCT 35.3* 09/10/2011   PLT 210.0 09/10/2011   GLUCOSE 100* 10/10/2012   CHOL 265* 12/22/2010   TRIG 85.0 12/22/2010   HDL 48.10 12/22/2010   LDLDIRECT 196.7 12/22/2010   LDLCALC 111* 09/02/2009   ALT 13 12/22/2010   AST 14 12/22/2010   NA 137 10/10/2012   K 4.2 10/10/2012   CL 103 10/10/2012   CREATININE 0.80 02/17/2013   BUN 14 10/10/2012   CO2 28 10/10/2012   TSH 1.61 04/26/2010   INR 0.96 03/24/2010   HGBA1C 5.7 04/26/2010         Assessment & Plan:

## 2013-05-28 NOTE — Assessment & Plan Note (Signed)
PRN meds and stretching

## 2013-05-28 NOTE — Assessment & Plan Note (Signed)
Continue with current prescription therapy as reflected on the Med list.  

## 2013-05-28 NOTE — Assessment & Plan Note (Signed)
Zantac 150 mg qhs

## 2013-05-28 NOTE — Assessment & Plan Note (Signed)
Stop Omeprazole Start Zantac Depomedrol 40 mg im Claritin 10 mg a day Triamc 0.5 % tid  Gluten free trial (no wheat products) for 4-6 weeks. OK to use gluten-free bread and gluten-free pasta. Milk free trial (no milk, ice cream, cheese and yogurt) for 4-6 weeks. OK to use almond, coconut, rice or soy milk.

## 2013-06-09 ENCOUNTER — Ambulatory Visit (INDEPENDENT_AMBULATORY_CARE_PROVIDER_SITE_OTHER): Payer: Medicare Other | Admitting: Internal Medicine

## 2013-06-09 ENCOUNTER — Encounter: Payer: Self-pay | Admitting: Internal Medicine

## 2013-06-09 VITALS — BP 110/70 | HR 68 | Temp 98.8°F | Resp 16 | Wt 127.0 lb

## 2013-06-09 DIAGNOSIS — L500 Allergic urticaria: Secondary | ICD-10-CM

## 2013-06-09 DIAGNOSIS — M545 Low back pain, unspecified: Secondary | ICD-10-CM | POA: Diagnosis not present

## 2013-06-09 DIAGNOSIS — M810 Age-related osteoporosis without current pathological fracture: Secondary | ICD-10-CM

## 2013-06-09 DIAGNOSIS — Z23 Encounter for immunization: Secondary | ICD-10-CM | POA: Diagnosis not present

## 2013-06-09 DIAGNOSIS — E538 Deficiency of other specified B group vitamins: Secondary | ICD-10-CM | POA: Diagnosis not present

## 2013-06-09 NOTE — Assessment & Plan Note (Signed)
Better  She has been off milk now. She will try guten free diet in a month if needed.Marland KitchenMarland Kitchen

## 2013-06-09 NOTE — Assessment & Plan Note (Signed)
Dr Lily Peer On Reclast 2012 Last 02/27/13

## 2013-06-09 NOTE — Assessment & Plan Note (Signed)
Better  

## 2013-06-09 NOTE — Progress Notes (Signed)
   Subjective:    Rash This is a new problem. The current episode started in the past 7 days. The problem has been resolved since onset. Pertinent negatives include no congestion, cough or fatigue. The treatment provided mild relief.     The patient is here to follow up on chronic  anxiety,  B12 and Vit D def, scoliosis/LBP, GERD symptoms controlled with medicines, diet and exercise.   BP Readings from Last 3 Encounters:  06/09/13 110/70  05/28/13 118/60  02/27/13 142/76   Wt Readings from Last 3 Encounters:  06/09/13 127 lb (57.607 kg)  05/28/13 129 lb (58.514 kg)  02/27/13 128 lb (58.06 kg)        Review of Systems  Constitutional: Negative for chills, activity change, appetite change, fatigue and unexpected weight change.  HENT: Negative for congestion, mouth sores and sinus pressure.   Eyes: Negative for visual disturbance.  Respiratory: Negative for cough and chest tightness.   Gastrointestinal: Negative for nausea and abdominal pain.       Stool incontinence - normal  Genitourinary: Negative for frequency, difficulty urinating and vaginal pain.  Musculoskeletal: Positive for back pain. Negative for gait problem.  Skin: Negative for pallor and rash.  Neurological: Negative for dizziness, tremors, weakness, numbness and headaches.  Psychiatric/Behavioral: Negative for confusion and sleep disturbance.       Objective:   Physical Exam  Constitutional: She appears well-developed and well-nourished. No distress.  HENT:  Head: Normocephalic.  Right Ear: External ear normal.  Left Ear: External ear normal.  Nose: Nose normal.  Mouth/Throat: Oropharynx is clear and moist.  Eyes: Conjunctivae are normal. Pupils are equal, round, and reactive to light. Right eye exhibits no discharge. Left eye exhibits no discharge.  Neck: Normal range of motion. Neck supple. No JVD present. No tracheal deviation present. No thyromegaly present.  Cardiovascular: Normal rate, regular  rhythm and normal heart sounds.   Pulmonary/Chest: No stridor. No respiratory distress. She has no wheezes.  Abdominal: Soft. Bowel sounds are normal. She exhibits no distension and no mass. There is no tenderness. There is no rebound and no guarding.  Musculoskeletal: She exhibits no edema and no tenderness.  Lymphadenopathy:    She has no cervical adenopathy.  Neurological: She displays normal reflexes. No cranial nerve deficit. She exhibits normal muscle tone. Coordination normal.  Thor scoliosis LS is tender  Skin: Rash noted. No erythema.  eryth exema-like rash  Psychiatric: She has a normal mood and affect. Her behavior is normal. Judgment and thought content normal.    Lab Results  Component Value Date   WBC 5.0 09/10/2011   HGB 12.1 09/10/2011   HCT 35.3* 09/10/2011   PLT 210.0 09/10/2011   GLUCOSE 100* 10/10/2012   CHOL 265* 12/22/2010   TRIG 85.0 12/22/2010   HDL 48.10 12/22/2010   LDLDIRECT 196.7 12/22/2010   LDLCALC 111* 09/02/2009   ALT 13 12/22/2010   AST 14 12/22/2010   NA 137 10/10/2012   K 4.2 10/10/2012   CL 103 10/10/2012   CREATININE 0.80 02/17/2013   BUN 14 10/10/2012   CO2 28 10/10/2012   TSH 1.61 04/26/2010   INR 0.96 03/24/2010   HGBA1C 5.7 04/26/2010         Assessment & Plan:

## 2013-06-09 NOTE — Assessment & Plan Note (Signed)
Continue with current prescription therapy as reflected on the Med list.  

## 2013-06-10 DIAGNOSIS — H903 Sensorineural hearing loss, bilateral: Secondary | ICD-10-CM | POA: Diagnosis not present

## 2013-07-21 ENCOUNTER — Other Ambulatory Visit: Payer: Self-pay | Admitting: Internal Medicine

## 2013-07-21 DIAGNOSIS — Z1231 Encounter for screening mammogram for malignant neoplasm of breast: Secondary | ICD-10-CM

## 2013-08-26 ENCOUNTER — Ambulatory Visit (HOSPITAL_COMMUNITY)
Admission: RE | Admit: 2013-08-26 | Discharge: 2013-08-26 | Disposition: A | Discharge status: Home / Self Care | Payer: Medicare Other | Source: Ambulatory Visit | Attending: Internal Medicine | Admitting: Internal Medicine

## 2013-08-26 DIAGNOSIS — Z1231 Encounter for screening mammogram for malignant neoplasm of breast: Secondary | ICD-10-CM

## 2013-09-15 ENCOUNTER — Ambulatory Visit (INDEPENDENT_AMBULATORY_CARE_PROVIDER_SITE_OTHER): Payer: Medicare Other | Admitting: Internal Medicine

## 2013-09-15 ENCOUNTER — Encounter: Payer: Self-pay | Admitting: Internal Medicine

## 2013-09-15 VITALS — BP 150/90 | HR 76 | Temp 96.9°F | Resp 16 | Wt 126.0 lb

## 2013-09-15 DIAGNOSIS — E559 Vitamin D deficiency, unspecified: Secondary | ICD-10-CM

## 2013-09-15 DIAGNOSIS — K9089 Other intestinal malabsorption: Secondary | ICD-10-CM | POA: Diagnosis not present

## 2013-09-15 DIAGNOSIS — M545 Low back pain, unspecified: Secondary | ICD-10-CM

## 2013-09-15 DIAGNOSIS — Z23 Encounter for immunization: Secondary | ICD-10-CM

## 2013-09-15 DIAGNOSIS — K9049 Malabsorption due to intolerance, not elsewhere classified: Secondary | ICD-10-CM | POA: Insufficient documentation

## 2013-09-15 DIAGNOSIS — L500 Allergic urticaria: Secondary | ICD-10-CM

## 2013-09-15 DIAGNOSIS — E538 Deficiency of other specified B group vitamins: Secondary | ICD-10-CM

## 2013-09-15 NOTE — Assessment & Plan Note (Signed)
Continue with current prescription therapy as reflected on the Med list.  

## 2013-09-15 NOTE — Progress Notes (Signed)
Pre visit review using our clinic review tool, if applicable. No additional management support is needed unless otherwise documented below in the visit note. 

## 2013-09-15 NOTE — Assessment & Plan Note (Signed)
Avoidance diet

## 2013-09-15 NOTE — Progress Notes (Signed)
   Subjective:    Rash This is a recurrent problem. The current episode started in the past 7 days. The problem has been rapidly improving since onset. Pertinent negatives include no congestion, cough or fatigue. The treatment provided significant relief.  Better off milk   The patient is here to follow up on chronic  anxiety,  B12 and Vit D def, scoliosis/LBP, GERD symptoms controlled with medicines, diet and exercise. Diarrhea is better  BP Readings from Last 3 Encounters:  09/15/13 150/90  06/09/13 110/70  05/28/13 118/60   Wt Readings from Last 3 Encounters:  09/15/13 134 lb (60.782 kg)  06/09/13 127 lb (57.607 kg)  05/28/13 129 lb (58.514 kg)        Review of Systems  Constitutional: Negative for chills, activity change, appetite change, fatigue and unexpected weight change.  HENT: Negative for congestion, mouth sores and sinus pressure.   Eyes: Negative for visual disturbance.  Respiratory: Negative for cough and chest tightness.   Gastrointestinal: Negative for nausea and abdominal pain.       Stool incontinence - normal  Genitourinary: Negative for frequency, difficulty urinating and vaginal pain.  Musculoskeletal: Positive for back pain. Negative for gait problem.  Skin: Negative for pallor and rash.  Neurological: Negative for dizziness, tremors, weakness, numbness and headaches.  Psychiatric/Behavioral: Negative for confusion and sleep disturbance.       Objective:   Physical Exam  Constitutional: She appears well-developed and well-nourished. No distress.  HENT:  Head: Normocephalic.  Right Ear: External ear normal.  Left Ear: External ear normal.  Nose: Nose normal.  Mouth/Throat: Oropharynx is clear and moist.  Eyes: Conjunctivae are normal. Pupils are equal, round, and reactive to light. Right eye exhibits no discharge. Left eye exhibits no discharge.  Neck: Normal range of motion. Neck supple. No JVD present. No tracheal deviation present. No  thyromegaly present.  Cardiovascular: Normal rate, regular rhythm and normal heart sounds.   Pulmonary/Chest: No stridor. No respiratory distress. She has no wheezes.  Abdominal: Soft. Bowel sounds are normal. She exhibits no distension and no mass. There is no tenderness. There is no rebound and no guarding.  Musculoskeletal: She exhibits no edema and no tenderness.  Lymphadenopathy:    She has no cervical adenopathy.  Neurological: She displays normal reflexes. No cranial nerve deficit. She exhibits normal muscle tone. Coordination normal.  Thor scoliosis LS is tender  Skin: Rash noted. No erythema.  eryth exema-like rash  Psychiatric: She has a normal mood and affect. Her behavior is normal. Judgment and thought content normal.    Lab Results  Component Value Date   WBC 5.0 09/10/2011   HGB 12.1 09/10/2011   HCT 35.3* 09/10/2011   PLT 210.0 09/10/2011   GLUCOSE 100* 10/10/2012   CHOL 265* 12/22/2010   TRIG 85.0 12/22/2010   HDL 48.10 12/22/2010   LDLDIRECT 196.7 12/22/2010   LDLCALC 111* 09/02/2009   ALT 13 12/22/2010   AST 14 12/22/2010   NA 137 10/10/2012   K 4.2 10/10/2012   CL 103 10/10/2012   CREATININE 0.80 02/17/2013   BUN 14 10/10/2012   CO2 28 10/10/2012   TSH 1.61 04/26/2010   INR 0.96 03/24/2010   HGBA1C 5.7 04/26/2010         Assessment & Plan:

## 2013-10-05 ENCOUNTER — Encounter: Payer: Self-pay | Admitting: Gynecology

## 2013-10-05 ENCOUNTER — Ambulatory Visit (INDEPENDENT_AMBULATORY_CARE_PROVIDER_SITE_OTHER): Payer: Medicare Other | Admitting: Gynecology

## 2013-10-05 VITALS — BP 128/82 | Wt 125.0 lb

## 2013-10-05 DIAGNOSIS — M4000 Postural kyphosis, site unspecified: Secondary | ICD-10-CM

## 2013-10-05 DIAGNOSIS — N952 Postmenopausal atrophic vaginitis: Secondary | ICD-10-CM

## 2013-10-05 DIAGNOSIS — Z8639 Personal history of other endocrine, nutritional and metabolic disease: Secondary | ICD-10-CM | POA: Diagnosis not present

## 2013-10-05 DIAGNOSIS — M40209 Unspecified kyphosis, site unspecified: Secondary | ICD-10-CM

## 2013-10-05 DIAGNOSIS — M810 Age-related osteoporosis without current pathological fracture: Secondary | ICD-10-CM

## 2013-10-05 DIAGNOSIS — Z86010 Personal history of colonic polyps: Secondary | ICD-10-CM | POA: Diagnosis not present

## 2013-10-05 DIAGNOSIS — N816 Rectocele: Secondary | ICD-10-CM

## 2013-10-05 NOTE — Progress Notes (Signed)
Isa Hitz [birth year] [id number]   History:    80 y.o.  for GYN followup exam. Patient was diagnosed with osteoporosis in 2012 and was started on Reclast.Her followup bone density study in 2013 demonstrated significant improvement of her distal one third of her left forearm . Her last bone density study demonstrated that her distal left one third radius had a T score of -2.5 and prior to treatment with  Reclast it was -3 point one in 2012.  Only her hips were able to be utilized for testing due to the fact that she has severe kyphosis or scoliosis making an unreliable for a BMD of the lower spine. She has tolerated the medication well. She is due for her next bone density study this year.Patient has a past history of benign colon polyps in [place]. Her colonoscopy and [place] was reported to be normal in 2011. Her last mammogram was normal November 2013. Dr. Alain Marion is her primary physician who has been treating her for hyperlipidemia as well as vitamin D deficiency. Patient denies any past history of Pap smears in the past when she was living in [place].     Past medical history,surgical history, family history and social history were all reviewed and documented in the EPIC chart.  Gynecologic History No LMP recorded. Patient is postmenopausal. Contraception: post menopausal status Last Pap: 2012. Results were: normal Last mammogram: 2014. Results were: normal  Obstetric History OB History  Gravida Para Term Preterm AB SAB TAB Ectopic Multiple Living  4 1 1  3 3    1     # Outcome Date GA Lbr Len/2nd Weight Sex Delivery Anes PTL Lv  4 SAB           3 SAB           2 SAB           1 TRM     F SVD  N Y       ROS: A ROS was performed and pertinent positives and negatives are included in the history.  GENERAL: No fevers or chills. HEENT: No change in vision, no earache, sore throat or sinus congestion. NECK: No pain or stiffness. CARDIOVASCULAR: No chest pain or pressure. No  palpitations. PULMONARY: No shortness of breath, cough or wheeze. GASTROINTESTINAL: No abdominal pain, nausea, vomiting or diarrhea, melena or bright red blood per rectum. GENITOURINARY: No urinary frequency, urgency, hesitancy or dysuria. MUSCULOSKELETAL: No joint or muscle pain, no back pain, no recent trauma. DERMATOLOGIC: No rash, no itching, no lesions. ENDOCRINE: No polyuria, polydipsia, no heat or cold intolerance. No recent change in weight. HEMATOLOGICAL: No anemia or easy bruising or bleeding. NEUROLOGIC: No headache, seizures, numbness, tingling or weakness. PSYCHIATRIC: No depression, no loss of interest in normal activity or change in sleep pattern.     Exam: chaperone present  BP 128/82  Wt 125 lb (56.7 kg)  Body mass index is 26.13 kg/(m^2).  General appearance : Well developed well nourished female. No acute distress HEENT: Neck supple, trachea midline, no carotid bruits, no thyroidmegaly Lungs: Clear to auscultation, no rhonchi or wheezes, or rib retractions  Heart: Regular rate and rhythm, no murmurs or gallops Breast:Examined in sitting and supine position were symmetrical in appearance, no palpable masses or tenderness,  no skin retraction, no nipple inversion, no nipple discharge, no skin discoloration, no axillary or supraclavicular lymphadenopathy Abdomen: no palpable masses or tenderness, no rebound or guarding Extremities: no edema or skin discoloration or tenderness  Pelvic:  Bartholin, Urethra, Skene Glands: Within normal limits             Vagina: No gross lesions or discharge, atrophic changes  Cervix: No gross lesions or discharge, first-degree rectocele  Uterus  axial, normal size, shape and consistency, non-tender and mobile  Adnexa  Without masses or tenderness  Anus and perineum  normal   Rectovaginal  normal sphincter tone without palpated masses or tenderness             Hemoccult PCP provides     Assessment/Plan:  80 y.o. postmenopausal patient  responding well to Reclast that was started in 2012. She will need her next dose in May of this year. She will need her next bone density study in April of this year. Her PCP has been doing her blood work. We discussed the importance of her vitamin D and calcium and active lifestyle. Patient states her vaccinations are up to date.  Note: This dictation was prepared with  Dragon/digital dictation along withSmart phrase technology. Any transcriptional errors that result from this process are unintentional.   Terrance Mass MD, 10:56 AM 10/05/2013

## 2013-10-28 DIAGNOSIS — H40029 Open angle with borderline findings, high risk, unspecified eye: Secondary | ICD-10-CM | POA: Diagnosis not present

## 2013-12-16 ENCOUNTER — Ambulatory Visit: Payer: Medicare Other | Admitting: Internal Medicine

## 2013-12-18 ENCOUNTER — Encounter: Payer: Self-pay | Admitting: Internal Medicine

## 2013-12-18 ENCOUNTER — Ambulatory Visit (INDEPENDENT_AMBULATORY_CARE_PROVIDER_SITE_OTHER): Payer: Medicare Other | Admitting: Internal Medicine

## 2013-12-18 VITALS — BP 140/80 | HR 80 | Temp 98.3°F | Resp 16 | Wt 121.0 lb

## 2013-12-18 DIAGNOSIS — R634 Abnormal weight loss: Secondary | ICD-10-CM

## 2013-12-18 DIAGNOSIS — K9089 Other intestinal malabsorption: Secondary | ICD-10-CM

## 2013-12-18 DIAGNOSIS — K219 Gastro-esophageal reflux disease without esophagitis: Secondary | ICD-10-CM

## 2013-12-18 DIAGNOSIS — E538 Deficiency of other specified B group vitamins: Secondary | ICD-10-CM

## 2013-12-18 DIAGNOSIS — R0789 Other chest pain: Secondary | ICD-10-CM

## 2013-12-18 DIAGNOSIS — R079 Chest pain, unspecified: Secondary | ICD-10-CM | POA: Diagnosis not present

## 2013-12-18 DIAGNOSIS — R072 Precordial pain: Secondary | ICD-10-CM

## 2013-12-18 DIAGNOSIS — M545 Low back pain, unspecified: Secondary | ICD-10-CM

## 2013-12-18 DIAGNOSIS — K9049 Malabsorption due to intolerance, not elsewhere classified: Secondary | ICD-10-CM

## 2013-12-18 NOTE — Assessment & Plan Note (Signed)
Continue with current prescription therapy as reflected on the Med list.  

## 2013-12-18 NOTE — Assessment & Plan Note (Signed)
CL scheduled  Declined ASA To ER if issues

## 2013-12-18 NOTE — Progress Notes (Signed)
   Subjective:    HPI   The patient is here to follow up on chronic  anxiety,  B12 and Vit D def, scoliosis/LBP, GERD, osteoporosis symptoms controlled with medicines, diet and exercise. Diarrhea is better C/o occ chest discomfort  BP Readings from Last 3 Encounters:  12/18/13 140/80  10/05/13 128/82  09/15/13 150/90   Wt Readings from Last 3 Encounters:  12/18/13 121 lb (54.885 kg)  10/05/13 125 lb (56.7 kg)  09/15/13 126 lb (57.153 kg)        Review of Systems  Constitutional: Negative for chills, activity change, appetite change and unexpected weight change.  HENT: Negative for mouth sores and sinus pressure.   Eyes: Negative for visual disturbance.  Respiratory: Negative for chest tightness.   Gastrointestinal: Negative for nausea and abdominal pain.       Stool incontinence - normal  Genitourinary: Negative for frequency, difficulty urinating and vaginal pain.  Musculoskeletal: Positive for back pain. Negative for gait problem.  Skin: Negative for pallor.  Neurological: Negative for dizziness, tremors, weakness, numbness and headaches.  Psychiatric/Behavioral: Negative for confusion and sleep disturbance.       Objective:   Physical Exam  Constitutional: She appears well-developed and well-nourished. No distress.  HENT:  Head: Normocephalic.  Right Ear: External ear normal.  Left Ear: External ear normal.  Nose: Nose normal.  Mouth/Throat: Oropharynx is clear and moist.  Eyes: Conjunctivae are normal. Pupils are equal, round, and reactive to light. Right eye exhibits no discharge. Left eye exhibits no discharge.  Neck: Normal range of motion. Neck supple. No JVD present. No tracheal deviation present. No thyromegaly present.  Cardiovascular: Normal rate, regular rhythm and normal heart sounds.   Pulmonary/Chest: No stridor. No respiratory distress. She has no wheezes.  Abdominal: Soft. Bowel sounds are normal. She exhibits no distension and no mass. There is  no tenderness. There is no rebound and no guarding.  Musculoskeletal: She exhibits no edema and no tenderness.  Lymphadenopathy:    She has no cervical adenopathy.  Neurological: She displays normal reflexes. No cranial nerve deficit. She exhibits normal muscle tone. Coordination normal.  Thor scoliosis LS is tender  Skin: Rash noted. No erythema.  eryth exema-like rash  Psychiatric: She has a normal mood and affect. Her behavior is normal. Judgment and thought content normal.    Lab Results  Component Value Date   WBC 5.0 09/10/2011   HGB 12.1 09/10/2011   HCT 35.3* 09/10/2011   PLT 210.0 09/10/2011   GLUCOSE 100* 10/10/2012   CHOL 265* 12/22/2010   TRIG 85.0 12/22/2010   HDL 48.10 12/22/2010   LDLDIRECT 196.7 12/22/2010   LDLCALC 111* 09/02/2009   ALT 13 12/22/2010   AST 14 12/22/2010   NA 137 10/10/2012   K 4.2 10/10/2012   CL 103 10/10/2012   CREATININE 0.80 02/17/2013   BUN 14 10/10/2012   CO2 28 10/10/2012   TSH 1.61 04/26/2010   INR 0.96 03/24/2010   HGBA1C 5.7 04/26/2010         Assessment & Plan:

## 2013-12-18 NOTE — Assessment & Plan Note (Signed)
Doing well off milk

## 2013-12-18 NOTE — Assessment & Plan Note (Signed)
3/15 chronic

## 2013-12-18 NOTE — Progress Notes (Signed)
Pre visit review using our clinic review tool, if applicable. No additional management support is needed unless otherwise documented below in the visit note. 

## 2014-01-06 ENCOUNTER — Ambulatory Visit (HOSPITAL_COMMUNITY): Payer: Medicare Other | Attending: Internal Medicine | Admitting: Radiology

## 2014-01-06 ENCOUNTER — Encounter: Payer: Self-pay | Admitting: Internal Medicine

## 2014-01-06 VITALS — BP 145/78 | HR 61 | Ht <= 58 in | Wt 118.0 lb

## 2014-01-06 DIAGNOSIS — R0602 Shortness of breath: Secondary | ICD-10-CM | POA: Diagnosis not present

## 2014-01-06 DIAGNOSIS — R0989 Other specified symptoms and signs involving the circulatory and respiratory systems: Secondary | ICD-10-CM | POA: Insufficient documentation

## 2014-01-06 DIAGNOSIS — R079 Chest pain, unspecified: Secondary | ICD-10-CM | POA: Diagnosis not present

## 2014-01-06 DIAGNOSIS — R0609 Other forms of dyspnea: Secondary | ICD-10-CM | POA: Diagnosis not present

## 2014-01-06 DIAGNOSIS — Z8249 Family history of ischemic heart disease and other diseases of the circulatory system: Secondary | ICD-10-CM | POA: Diagnosis not present

## 2014-01-06 MED ORDER — TECHNETIUM TC 99M SESTAMIBI GENERIC - CARDIOLITE
30.0000 | Freq: Once | INTRAVENOUS | Status: AC | PRN
  Administered 2014-01-06: 30 via INTRAVENOUS

## 2014-01-06 MED ORDER — REGADENOSON 0.4 MG/5ML IV SOLN
0.4000 mg | Freq: Once | INTRAVENOUS | Status: AC
  Administered 2014-01-06: 0.4 mg via INTRAVENOUS

## 2014-01-06 MED ORDER — TECHNETIUM TC 99M SESTAMIBI GENERIC - CARDIOLITE
10.0000 | Freq: Once | INTRAVENOUS | Status: AC | PRN
  Administered 2014-01-06: 10 via INTRAVENOUS

## 2014-01-06 NOTE — Progress Notes (Signed)
[facility] [address] [place], [place] [address] [phone]    Cardiology Nuclear Med Study  Joyce Riley is a 80 y.o. female     MRN : [id number]     DOB: 1934-01-21  Procedure Date: 01/06/2014  Nuclear Med Background Indication for Stress Test:  Evaluation for Ischemia History: No prior known history of CAD Cardiac Risk Factors: Family History - CAD and Lipids  Symptoms: Chest Pain with/without exertion (last occurrence yesterday), DOE   Nuclear Pre-Procedure Caffeine/Decaff Intake:  None > 12 hrs NPO After: 6:00pm   Lungs:  clear O2 Sat: 98% on room air. IV 0.9% NS with Angio Cath:  22g  IV Site: R Hand x 1, tolerated well IV Started by:  Irven Baltimore, RN  Chest Size (in):  36 Cup Size: B  Height: 4\' 10"  (1.473 m)  Weight:  118 lb (53.524 kg)  BMI:  Body mass index is 24.67 kg/(m^2). Tech Comments:  [place] interpreter present    Nuclear Med Study 1 or 2 day study: 1 day  Stress Test Type:  Lexiscan  Reading MD: N/A  Order Authorizing Provider:  Walker Kehr, MD  Resting Radionuclide: Technetium 81m Sestamibi  Resting Radionuclide Dose: 11.0 mCi   Stress Radionuclide:  Technetium 65m Sestamibi  Stress Radionuclide Dose: 33.0 mCi           Stress Protocol Rest HR: 61 Stress HR: 112  Rest BP: 148/78 Stress BP: 140/80  Exercise Time (min): n/a METS: n/a           Dose of Adenosine (mg):  n/a Dose of Lexiscan: 0.4 mg  Dose of Atropine (mg): n/a Dose of Dobutamine: n/a mcg/kg/min (at max HR)  Stress Test Technologist: Glade Lloyd, BS-ES  Nuclear Technologist:  Charlton Amor, CNMT     Rest Procedure:  Myocardial perfusion imaging was performed at rest 45 minutes following the intravenous administration of Technetium 80m Sestamibi. Rest ECG: NSR - Normal EKG  Stress Procedure:  The patient received IV Lexiscan 0.4 mg over 15-seconds.  Technetium 42m Sestamibi injected at 30-seconds.  Quantitative spect images were  obtained after a 45 minute delay.  During the infusion of Lexiscan, the patient complained of heart beating fast, dizziness and nausea.  These symptoms began to resolve in recovery.  Stress ECG: No significant change from baseline ECG  QPS Raw Data Images:  Normal; no motion artifact; normal heart/lung ratio. Stress Images:  Normal homogeneous uptake in all areas of the myocardium. Rest Images:  Normal homogeneous uptake in all areas of the myocardium. Subtraction (SDS):  No evidence of ischemia. Transient Ischemic Dilatation (Normal <1.22):  0.97 Lung/Heart Ratio (Normal <0.45):  0.28  Quantitative Gated Spect Images QGS EDV:  64 ml QGS ESV:  23 ml  Impression Exercise Capacity:  Lexiscan with no exercise. BP Response:  Normal blood pressure response. Clinical Symptoms:  No chest pain. ECG Impression:  No significant ST segment change suggestive of ischemia. Comparison with Prior Nuclear Study: No previous nuclear study performed  Overall Impression:  Normal stress nuclear study.  LV Ejection Fraction: 64%.  LV Wall Motion:  NL LV Function; NL Wall Motion  Darlin Coco MD

## 2014-02-16 ENCOUNTER — Other Ambulatory Visit (INDEPENDENT_AMBULATORY_CARE_PROVIDER_SITE_OTHER): Payer: Medicare Other

## 2014-02-16 DIAGNOSIS — Z79899 Other long term (current) drug therapy: Secondary | ICD-10-CM | POA: Diagnosis not present

## 2014-02-16 DIAGNOSIS — M545 Low back pain, unspecified: Secondary | ICD-10-CM | POA: Diagnosis not present

## 2014-02-16 DIAGNOSIS — E538 Deficiency of other specified B group vitamins: Secondary | ICD-10-CM | POA: Diagnosis not present

## 2014-02-16 DIAGNOSIS — K9089 Other intestinal malabsorption: Secondary | ICD-10-CM

## 2014-02-16 DIAGNOSIS — K219 Gastro-esophageal reflux disease without esophagitis: Secondary | ICD-10-CM

## 2014-02-16 DIAGNOSIS — R079 Chest pain, unspecified: Secondary | ICD-10-CM

## 2014-02-16 DIAGNOSIS — K9049 Malabsorption due to intolerance, not elsewhere classified: Secondary | ICD-10-CM

## 2014-02-16 DIAGNOSIS — R072 Precordial pain: Secondary | ICD-10-CM

## 2014-02-16 DIAGNOSIS — R634 Abnormal weight loss: Secondary | ICD-10-CM

## 2014-02-16 DIAGNOSIS — R0789 Other chest pain: Secondary | ICD-10-CM

## 2014-02-16 LAB — HEPATIC FUNCTION PANEL
ALBUMIN: 3.6 g/dL (ref 3.5–5.2)
ALT: 11 U/L (ref 0–35)
AST: 14 U/L (ref 0–37)
Alkaline Phosphatase: 37 U/L — ABNORMAL LOW (ref 39–117)
BILIRUBIN DIRECT: 0.1 mg/dL (ref 0.0–0.3)
Total Bilirubin: 0.8 mg/dL (ref 0.2–1.2)
Total Protein: 6.6 g/dL (ref 6.0–8.3)

## 2014-02-16 LAB — LIPID PANEL
Cholesterol: 240 mg/dL — ABNORMAL HIGH (ref 0–200)
HDL: 50.6 mg/dL (ref >39.00)
LDL CALC: 171 mg/dL — AB (ref 0–99)
Total CHOL/HDL Ratio: 5
Triglycerides: 90 mg/dL (ref 0.0–149.0)
VLDL: 18 mg/dL (ref 0.0–40.0)

## 2014-02-16 LAB — BASIC METABOLIC PANEL
BUN: 11 mg/dL (ref 6–23)
CHLORIDE: 105 meq/L (ref 96–112)
CO2: 29 mEq/L (ref 19–32)
Calcium: 8.7 mg/dL (ref 8.4–10.5)
Creatinine, Ser: 0.9 mg/dL (ref 0.4–1.2)
GFR: 63.58 mL/min (ref >60.00)
GLUCOSE: 89 mg/dL (ref 70–99)
Potassium: 4.5 mEq/L (ref 3.5–5.1)
Sodium: 140 mEq/L (ref 135–145)

## 2014-02-16 LAB — URINALYSIS, ROUTINE W REFLEX MICROSCOPIC
BILIRUBIN URINE: NEGATIVE
Ketones, ur: NEGATIVE
NITRITE: NEGATIVE
PH: 6.5 (ref 5.0–8.0)
Specific Gravity, Urine: 1.02 (ref 1.000–1.030)
TOTAL PROTEIN, URINE-UPE24: NEGATIVE
Urine Glucose: NEGATIVE
Urobilinogen, UA: 0.2 (ref 0.0–1.0)

## 2014-02-16 LAB — CBC WITH DIFFERENTIAL/PLATELET
Basophils Absolute: 0 10*3/uL (ref 0.0–0.1)
Basophils Relative: 0.4 % (ref 0.0–3.0)
EOS PCT: 0.8 % (ref 0.0–5.0)
Eosinophils Absolute: 0 10*3/uL (ref 0.0–0.7)
HEMATOCRIT: 35.3 % — AB (ref 36.0–46.0)
Hemoglobin: 12 g/dL (ref 12.0–15.0)
LYMPHS ABS: 2.1 10*3/uL (ref 0.7–4.0)
Lymphocytes Relative: 40.8 % (ref 12.0–46.0)
MCHC: 34.1 g/dL (ref 30.0–36.0)
MCV: 91 fl (ref 78.0–100.0)
MONO ABS: 0.4 10*3/uL (ref 0.1–1.0)
Monocytes Relative: 8 % (ref 3.0–12.0)
Neutro Abs: 2.6 10*3/uL (ref 1.4–7.7)
Neutrophils Relative %: 50 % (ref 43.0–77.0)
Platelets: 203 10*3/uL (ref 150.0–400.0)
RBC: 3.87 Mil/uL (ref 3.87–5.11)
RDW: 12.2 % (ref 11.5–15.5)
WBC: 5.3 10*3/uL (ref 4.0–10.5)

## 2014-02-16 LAB — TSH: TSH: 1.65 u[IU]/mL (ref 0.35–4.50)

## 2014-02-17 ENCOUNTER — Telehealth: Payer: Self-pay | Admitting: Internal Medicine

## 2014-02-17 ENCOUNTER — Telehealth: Payer: Self-pay | Admitting: *Deleted

## 2014-02-17 NOTE — Telephone Encounter (Signed)
Pt came in to request blood test results from yesterday to be faxed to [facility]; Dr. Uvaldo Rising at [phone]. Please contact pt spouse when request is completed.

## 2014-02-17 NOTE — Telephone Encounter (Signed)
I spoke with patients daughter. Labs were drawn yesterday at PCP. Normal and ok to proceed with Reclast.  She has Medicare and Medicaid therefore it is covered. Reclast needs to be after 02/27/14. Apt [facility] Medical Day 03/01/14 at 10am. Instruction sheet mailed to patient. Its in patients information in EPIC that she needs a [place] interpreter. Daughter was informed of apt. Order will be signed and faxed. KW CMA

## 2014-02-17 NOTE — Telephone Encounter (Signed)
Dr. Toney Rakes is on Epic and it appears that his staff has already viewed the 02/16/14 labs. I called pt's spouse. He doesn't understand English enough for me to explain the situation. Closing phone note.

## 2014-02-18 ENCOUNTER — Other Ambulatory Visit: Payer: Medicare Other

## 2014-02-26 ENCOUNTER — Other Ambulatory Visit (HOSPITAL_COMMUNITY): Payer: Self-pay | Admitting: *Deleted

## 2014-03-01 ENCOUNTER — Ambulatory Visit (HOSPITAL_COMMUNITY)
Admission: RE | Admit: 2014-03-01 | Discharge: 2014-03-01 | Disposition: A | Discharge status: Home / Self Care | Payer: Medicare Other | Source: Ambulatory Visit | Attending: Gynecology | Admitting: Gynecology

## 2014-03-01 DIAGNOSIS — Z5181 Encounter for therapeutic drug level monitoring: Secondary | ICD-10-CM | POA: Diagnosis not present

## 2014-03-01 DIAGNOSIS — M810 Age-related osteoporosis without current pathological fracture: Secondary | ICD-10-CM | POA: Diagnosis not present

## 2014-03-01 MED ORDER — ZOLEDRONIC ACID 5 MG/100ML IV SOLN
INTRAVENOUS | Status: AC
Start: 2014-03-01 — End: 2014-03-01
  Filled 2014-03-01: qty 100

## 2014-03-01 MED ORDER — ZOLEDRONIC ACID 5 MG/100ML IV SOLN
5.0000 mg | Freq: Once | INTRAVENOUS | Status: AC
  Administered 2014-03-01: 5 mg via INTRAVENOUS

## 2014-03-22 ENCOUNTER — Ambulatory Visit (INDEPENDENT_AMBULATORY_CARE_PROVIDER_SITE_OTHER): Payer: Medicare Other | Admitting: Internal Medicine

## 2014-03-22 ENCOUNTER — Encounter: Payer: Self-pay | Admitting: Internal Medicine

## 2014-03-22 VITALS — BP 138/64 | HR 68 | Temp 98.3°F | Resp 16 | Wt 120.0 lb

## 2014-03-22 DIAGNOSIS — K2100 Gastro-esophageal reflux disease with esophagitis, without bleeding: Secondary | ICD-10-CM | POA: Diagnosis not present

## 2014-03-22 DIAGNOSIS — E538 Deficiency of other specified B group vitamins: Secondary | ICD-10-CM | POA: Diagnosis not present

## 2014-03-22 DIAGNOSIS — E559 Vitamin D deficiency, unspecified: Secondary | ICD-10-CM

## 2014-03-22 NOTE — Progress Notes (Signed)
   Subjective:    HPI   The patient is here to follow up on chronic  anxiety,  B12 and Vit D def, scoliosis/LBP, GERD, osteoporosis symptoms controlled with medicines, diet and exercise. Diarrhea is better C/o OA - started glucosamine and stopped Vit D and B12...  BP Readings from Last 3 Encounters:  03/22/14 138/64  03/01/14 150/83  01/06/14 145/78   Wt Readings from Last 3 Encounters:  03/22/14 120 lb (54.432 kg)  01/06/14 118 lb (53.524 kg)  12/18/13 121 lb (54.885 kg)        Review of Systems  Constitutional: Negative for chills, activity change, appetite change and unexpected weight change.  HENT: Negative for mouth sores and sinus pressure.   Eyes: Negative for visual disturbance.  Respiratory: Negative for chest tightness.   Gastrointestinal: Negative for nausea and abdominal pain.       Stool incontinence - normal  Genitourinary: Negative for frequency, difficulty urinating and vaginal pain.  Musculoskeletal: Positive for back pain. Negative for gait problem.  Skin: Negative for pallor.  Neurological: Negative for dizziness, tremors, weakness, numbness and headaches.  Psychiatric/Behavioral: Negative for confusion and sleep disturbance.       Objective:   Physical Exam  Constitutional: She appears well-developed and well-nourished. No distress.  HENT:  Head: Normocephalic.  Right Ear: External ear normal.  Left Ear: External ear normal.  Nose: Nose normal.  Mouth/Throat: Oropharynx is clear and moist.  Eyes: Conjunctivae are normal. Pupils are equal, round, and reactive to light. Right eye exhibits no discharge. Left eye exhibits no discharge.  Neck: Normal range of motion. Neck supple. No JVD present. No tracheal deviation present. No thyromegaly present.  Cardiovascular: Normal rate, regular rhythm and normal heart sounds.   Pulmonary/Chest: No stridor. No respiratory distress. She has no wheezes.  Abdominal: Soft. Bowel sounds are normal. She exhibits  no distension and no mass. There is no tenderness. There is no rebound and no guarding.  Musculoskeletal: She exhibits no edema and no tenderness.  Lymphadenopathy:    She has no cervical adenopathy.  Neurological: She displays normal reflexes. No cranial nerve deficit. She exhibits normal muscle tone. Coordination normal.  Thor scoliosis LS is tender  Skin: Rash noted. No erythema.  eryth exema-like rash  Psychiatric: She has a normal mood and affect. Her behavior is normal. Judgment and thought content normal.    Lab Results  Component Value Date   WBC 5.3 02/16/2014   HGB 12.0 02/16/2014   HCT 35.3* 02/16/2014   PLT 203.0 02/16/2014   GLUCOSE 89 02/16/2014   CHOL 240* 02/16/2014   TRIG 90.0 02/16/2014   HDL 50.60 02/16/2014   LDLDIRECT 196.7 12/22/2010   LDLCALC 171* 02/16/2014   ALT 11 02/16/2014   AST 14 02/16/2014   NA 140 02/16/2014   K 4.5 02/16/2014   CL 105 02/16/2014   CREATININE 0.9 02/16/2014   BUN 11 02/16/2014   CO2 29 02/16/2014   TSH 1.65 02/16/2014   INR 0.96 03/24/2010   HGBA1C 5.7 04/26/2010         Assessment & Plan:

## 2014-03-22 NOTE — Assessment & Plan Note (Addendum)
Re-start B12 

## 2014-03-22 NOTE — Assessment & Plan Note (Signed)
Continue with current prescription therapy as reflected on the Med list.  

## 2014-03-22 NOTE — Progress Notes (Signed)
Pre visit review using our clinic review tool, if applicable. No additional management support is needed unless otherwise documented below in the visit note. 

## 2014-03-22 NOTE — Assessment & Plan Note (Signed)
Re-start Vit D 

## 2014-03-22 NOTE — Patient Instructions (Addendum)
Wt Readings from Last 3 Encounters:  03/22/14 120 lb (54.432 kg)  01/06/14 118 lb (53.524 kg)  12/18/13 121 lb (54.885 kg)   BP Readings from Last 3 Encounters:  03/22/14 138/64  03/01/14 150/83  01/06/14 145/78    Gluten free trial (no wheat products) for 4-6 weeks. OK to use gluten-free bread and gluten-free pasta.

## 2014-05-05 DIAGNOSIS — Z961 Presence of intraocular lens: Secondary | ICD-10-CM | POA: Diagnosis not present

## 2014-05-05 DIAGNOSIS — H40029 Open angle with borderline findings, high risk, unspecified eye: Secondary | ICD-10-CM | POA: Diagnosis not present

## 2014-05-05 DIAGNOSIS — H35379 Puckering of macula, unspecified eye: Secondary | ICD-10-CM | POA: Diagnosis not present

## 2014-05-05 DIAGNOSIS — H35369 Drusen (degenerative) of macula, unspecified eye: Secondary | ICD-10-CM | POA: Diagnosis not present

## 2014-05-09 ENCOUNTER — Encounter: Payer: Self-pay | Admitting: Internal Medicine

## 2014-06-24 ENCOUNTER — Ambulatory Visit (INDEPENDENT_AMBULATORY_CARE_PROVIDER_SITE_OTHER): Payer: Medicare Other

## 2014-06-24 DIAGNOSIS — Z23 Encounter for immunization: Secondary | ICD-10-CM | POA: Diagnosis not present

## 2014-06-30 ENCOUNTER — Ambulatory Visit: Payer: Medicare Other | Admitting: Internal Medicine

## 2014-07-26 ENCOUNTER — Encounter: Payer: Self-pay | Admitting: Internal Medicine

## 2014-08-10 ENCOUNTER — Other Ambulatory Visit: Payer: Self-pay | Admitting: Gynecology

## 2014-08-10 DIAGNOSIS — Z1231 Encounter for screening mammogram for malignant neoplasm of breast: Secondary | ICD-10-CM

## 2014-08-16 ENCOUNTER — Ambulatory Visit (INDEPENDENT_AMBULATORY_CARE_PROVIDER_SITE_OTHER): Payer: Medicare Other | Admitting: Internal Medicine

## 2014-08-16 ENCOUNTER — Encounter: Payer: Self-pay | Admitting: Internal Medicine

## 2014-08-16 ENCOUNTER — Other Ambulatory Visit (INDEPENDENT_AMBULATORY_CARE_PROVIDER_SITE_OTHER): Payer: Medicare Other

## 2014-08-16 VITALS — BP 130/84 | HR 75 | Temp 98.5°F | Wt 123.0 lb

## 2014-08-16 DIAGNOSIS — K904 Malabsorption due to intolerance, not elsewhere classified: Secondary | ICD-10-CM | POA: Diagnosis not present

## 2014-08-16 DIAGNOSIS — E559 Vitamin D deficiency, unspecified: Secondary | ICD-10-CM

## 2014-08-16 DIAGNOSIS — R51 Headache: Secondary | ICD-10-CM

## 2014-08-16 DIAGNOSIS — G44219 Episodic tension-type headache, not intractable: Secondary | ICD-10-CM

## 2014-08-16 DIAGNOSIS — M6289 Other specified disorders of muscle: Secondary | ICD-10-CM

## 2014-08-16 DIAGNOSIS — E538 Deficiency of other specified B group vitamins: Secondary | ICD-10-CM

## 2014-08-16 DIAGNOSIS — R29898 Other symptoms and signs involving the musculoskeletal system: Secondary | ICD-10-CM

## 2014-08-16 DIAGNOSIS — K9049 Malabsorption due to intolerance, not elsewhere classified: Secondary | ICD-10-CM

## 2014-08-16 DIAGNOSIS — R519 Headache, unspecified: Secondary | ICD-10-CM | POA: Insufficient documentation

## 2014-08-16 LAB — BASIC METABOLIC PANEL
BUN: 12 mg/dL (ref 6–23)
CALCIUM: 9.1 mg/dL (ref 8.4–10.5)
CO2: 28 meq/L (ref 19–32)
CREATININE: 0.9 mg/dL (ref 0.4–1.2)
Chloride: 103 mEq/L (ref 96–112)
GFR: 66.88 mL/min (ref >60.00)
Glucose, Bld: 83 mg/dL (ref 70–99)
Potassium: 4.1 mEq/L (ref 3.5–5.1)
SODIUM: 141 meq/L (ref 135–145)

## 2014-08-16 LAB — CBC WITH DIFFERENTIAL/PLATELET
Basophils Absolute: 0 10*3/uL (ref 0.0–0.1)
Basophils Relative: 0.5 % (ref 0.0–3.0)
Eosinophils Absolute: 0.1 10*3/uL (ref 0.0–0.7)
Eosinophils Relative: 0.8 % (ref 0.0–5.0)
HEMATOCRIT: 37.2 % (ref 36.0–46.0)
HEMOGLOBIN: 12.5 g/dL (ref 12.0–15.0)
LYMPHS ABS: 2.4 10*3/uL (ref 0.7–4.0)
Lymphocytes Relative: 37.9 % (ref 12.0–46.0)
MCHC: 33.7 g/dL (ref 30.0–36.0)
MCV: 90.8 fl (ref 78.0–100.0)
MONOS PCT: 6.3 % (ref 3.0–12.0)
Monocytes Absolute: 0.4 10*3/uL (ref 0.1–1.0)
NEUTROS ABS: 3.4 10*3/uL (ref 1.4–7.7)
Neutrophils Relative %: 54.5 % (ref 43.0–77.0)
Platelets: 212 10*3/uL (ref 150.0–400.0)
RBC: 4.09 Mil/uL (ref 3.87–5.11)
RDW: 12.4 % (ref 11.5–15.5)
WBC: 6.3 10*3/uL (ref 4.0–10.5)

## 2014-08-16 LAB — SEDIMENTATION RATE: Sed Rate: 21 mm/hr (ref 0–22)

## 2014-08-16 LAB — VITAMIN B12: Vitamin B-12: 723 pg/mL (ref 211–911)

## 2014-08-16 NOTE — Progress Notes (Signed)
Pre visit review using our clinic review tool, if applicable. No additional management support is needed unless otherwise documented below in the visit note. 

## 2014-08-16 NOTE — Assessment & Plan Note (Signed)
11/15 new x2 mo ?etiology  ESR

## 2014-08-16 NOTE — Assessment & Plan Note (Signed)
2015 rare - likely de to cervical OA  03/2010 CT CERVICAL SPINE  Findings: There is no visible cervical spine fracture or traumatic subluxation. There is moderate spondylosis with disc space narrowing C4-5, and C5-6. There is no prevertebral soft tissue swelling. No intraspinal hematoma is seen. Lung apices are clear. Airway is midline. Craniocervical junction and cervicothoracic junction intact.  IMPRESSION: There is no visible cervical spine fracture or traumatic Subluxation.  Will watch

## 2014-08-16 NOTE — Progress Notes (Signed)
Subjective:    HPI   The patient is here to follow up on chronic  anxiety,  B12 and Vit D def, scoliosis/LBP, GERD, osteoporosis symptoms controlled with medicines, diet and exercise. Diarrhea is better C/o fatigue. She had a couple episode when R grip got weak (once in a dentist's chair) C/o occ HAs off and on x 2 mo F/u OA - started glucosamine and stopped Vit D and B12...  BP Readings from Last 3 Encounters:  08/16/14 130/84  03/22/14 138/64  03/01/14 150/83   Wt Readings from Last 3 Encounters:  08/16/14 123 lb (55.792 kg)  03/22/14 120 lb (54.432 kg)  01/06/14 118 lb (53.524 kg)     Review of Systems  Constitutional: Negative for chills, activity change, appetite change and unexpected weight change.  HENT: Negative for mouth sores and sinus pressure.   Eyes: Negative for visual disturbance.  Respiratory: Negative for chest tightness.   Gastrointestinal: Negative for nausea and abdominal pain.       Stool incontinence - normal  Genitourinary: Negative for frequency, difficulty urinating and vaginal pain.  Musculoskeletal: Positive for back pain. Negative for gait problem.  Skin: Negative for pallor.  Neurological: Negative for dizziness, tremors, weakness, numbness and headaches.  Psychiatric/Behavioral: Negative for confusion and sleep disturbance.       Objective:   Physical Exam  Constitutional: She appears well-developed. No distress.  HENT:  Head: Normocephalic.  Right Ear: External ear normal.  Left Ear: External ear normal.  Nose: Nose normal.  Mouth/Throat: Oropharynx is clear and moist.  Eyes: Conjunctivae are normal. Pupils are equal, round, and reactive to light. Right eye exhibits no discharge. Left eye exhibits no discharge.  Neck: Normal range of motion. Neck supple. No JVD present. No tracheal deviation present. No thyromegaly present.  Cardiovascular: Normal rate, regular rhythm and normal heart sounds.   Pulmonary/Chest: No stridor. No  respiratory distress. She has no wheezes.  Abdominal: Soft. Bowel sounds are normal. She exhibits no distension and no mass. There is no tenderness. There is no rebound and no guarding.  Musculoskeletal: She exhibits no edema or tenderness.  Lymphadenopathy:    She has no cervical adenopathy.  Neurological: She displays normal reflexes. No cranial nerve deficit. She exhibits normal muscle tone. Coordination normal.  Skin: No rash noted. No erythema.  Psychiatric: She has a normal mood and affect. Her behavior is normal. Judgment and thought content normal.  C spine, B shoulders ok B grips are nl, no muscle atrophy Head - NT  Lab Results  Component Value Date   WBC 5.3 02/16/2014   HGB 12.0 02/16/2014   HCT 35.3* 02/16/2014   PLT 203.0 02/16/2014   GLUCOSE 89 02/16/2014   CHOL 240* 02/16/2014   TRIG 90.0 02/16/2014   HDL 50.60 02/16/2014   LDLDIRECT 196.7 12/22/2010   LDLCALC 171* 02/16/2014   ALT 11 02/16/2014   AST 14 02/16/2014   NA 140 02/16/2014   K 4.5 02/16/2014   CL 105 02/16/2014   CREATININE 0.9 02/16/2014   BUN 11 02/16/2014   CO2 29 02/16/2014   TSH 1.65 02/16/2014   INR 0.96 03/24/2010   HGBA1C 5.7 04/26/2010    03/2010 CT CERVICAL SPINE  Findings: There is no visible cervical spine fracture or traumatic subluxation. There is moderate spondylosis with disc space narrowing C4-5, and C5-6. There is no prevertebral soft tissue swelling. No intraspinal hematoma is seen. Lung apices are clear. Airway is midline. Craniocervical junction and cervicothoracic junction intact.  IMPRESSION: There is no visible cervical spine fracture or traumatic subluxation.     Assessment & Plan:

## 2014-08-16 NOTE — Assessment & Plan Note (Signed)
No dairy diet - better

## 2014-08-16 NOTE — Assessment & Plan Note (Signed)
Continue with current  therapy as reflected on the Med list.

## 2014-08-30 ENCOUNTER — Ambulatory Visit (HOSPITAL_COMMUNITY)
Admission: RE | Admit: 2014-08-30 | Discharge: 2014-08-30 | Disposition: A | Discharge status: Home / Self Care | Payer: Medicare Other | Source: Ambulatory Visit | Attending: Gynecology | Admitting: Gynecology

## 2014-08-30 DIAGNOSIS — Z1231 Encounter for screening mammogram for malignant neoplasm of breast: Secondary | ICD-10-CM | POA: Diagnosis not present

## 2014-09-29 DIAGNOSIS — H903 Sensorineural hearing loss, bilateral: Secondary | ICD-10-CM | POA: Diagnosis not present

## 2014-10-11 ENCOUNTER — Encounter (INDEPENDENT_AMBULATORY_CARE_PROVIDER_SITE_OTHER): Payer: Self-pay | Admitting: Gynecology

## 2014-10-20 NOTE — Progress Notes (Signed)
Cancelled appointment

## 2014-10-26 ENCOUNTER — Encounter: Payer: Medicare Other | Admitting: Gynecology

## 2014-11-03 DIAGNOSIS — H40023 Open angle with borderline findings, high risk, bilateral: Secondary | ICD-10-CM | POA: Diagnosis not present

## 2014-11-03 DIAGNOSIS — H5340 Unspecified visual field defects: Secondary | ICD-10-CM | POA: Diagnosis not present

## 2014-11-03 DIAGNOSIS — H524 Presbyopia: Secondary | ICD-10-CM | POA: Diagnosis not present

## 2014-11-11 ENCOUNTER — Ambulatory Visit (INDEPENDENT_AMBULATORY_CARE_PROVIDER_SITE_OTHER): Payer: Medicare Other | Admitting: Gynecology

## 2014-11-11 ENCOUNTER — Encounter: Payer: Self-pay | Admitting: Gynecology

## 2014-11-11 VITALS — BP 130/88 | Ht <= 58 in | Wt 122.0 lb

## 2014-11-11 DIAGNOSIS — Z780 Asymptomatic menopausal state: Secondary | ICD-10-CM

## 2014-11-11 DIAGNOSIS — Z01419 Encounter for gynecological examination (general) (routine) without abnormal findings: Secondary | ICD-10-CM

## 2014-11-11 DIAGNOSIS — M810 Age-related osteoporosis without current pathological fracture: Secondary | ICD-10-CM

## 2014-11-11 DIAGNOSIS — Z8639 Personal history of other endocrine, nutritional and metabolic disease: Secondary | ICD-10-CM

## 2014-11-11 NOTE — Patient Instructions (Signed)
Zoledronic Acid injection (Paget's Disease, Osteoporosis) What is this medicine? ZOLEDRONIC ACID (ZOE le dron ik AS id) lowers the amount of calcium loss from bone. It is used to treat Paget's disease and osteoporosis in women. This medicine may be used for other purposes; ask your health care provider or pharmacist if you have questions. COMMON BRAND NAME(S): Reclast, Zometa What should I tell my health care provider before I take this medicine? They need to know if you have any of these conditions: -aspirin-sensitive asthma -cancer, especially if you are receiving medicines used to treat cancer -dental disease or wear dentures -infection -kidney disease -low levels of calcium in the blood -past surgery on the parathyroid gland or intestines -receiving corticosteroids like dexamethasone or prednisone -an unusual or allergic reaction to zoledronic acid, other medicines, foods, dyes, or preservatives -pregnant or trying to get pregnant -breast-feeding How should I use this medicine? This medicine is for infusion into a vein. It is given by a health care professional in a hospital or clinic setting. Talk to your pediatrician regarding the use of this medicine in children. This medicine is not approved for use in children. Overdosage: If you think you have taken too much of this medicine contact a poison control center or emergency room at once. NOTE: This medicine is only for you. Do not share this medicine with others. What if I miss a dose? It is important not to miss your dose. Call your doctor or health care professional if you are unable to keep an appointment. What may interact with this medicine? -certain antibiotics given by injection -NSAIDs, medicines for pain and inflammation, like ibuprofen or naproxen -some diuretics like bumetanide, furosemide -teriparatide This list may not describe all possible interactions. Give your health care provider a list of all the medicines,  herbs, non-prescription drugs, or dietary supplements you use. Also tell them if you smoke, drink alcohol, or use illegal drugs. Some items may interact with your medicine. What should I watch for while using this medicine? Visit your doctor or health care professional for regular checkups. It may be some time before you see the benefit from this medicine. Do not stop taking your medicine unless your doctor tells you to. Your doctor may order blood tests or other tests to see how you are doing. Women should inform their doctor if they wish to become pregnant or think they might be pregnant. There is a potential for serious side effects to an unborn child. Talk to your health care professional or pharmacist for more information. You should make sure that you get enough calcium and vitamin D while you are taking this medicine. Discuss the foods you eat and the vitamins you take with your health care professional. Some people who take this medicine have severe bone, joint, and/or muscle pain. This medicine may also increase your risk for jaw problems or a broken thigh bone. Tell your doctor right away if you have severe pain in your jaw, bones, joints, or muscles. Tell your doctor if you have any pain that does not go away or that gets worse. Tell your dentist and dental surgeon that you are taking this medicine. You should not have major dental surgery while on this medicine. See your dentist to have a dental exam and fix any dental problems before starting this medicine. Take good care of your teeth while on this medicine. Make sure you see your dentist for regular follow-up appointments. What side effects may I notice from receiving this medicine?  Side effects that you should report to your doctor or health care professional as soon as possible: -allergic reactions like skin rash, itching or hives, swelling of the face, lips, or tongue -anxiety, confusion, or depression -breathing problems -changes in  vision -eye pain -feeling faint or lightheaded, falls -jaw pain, especially after dental work -mouth sores -muscle cramps, stiffness, or weakness -trouble passing urine or change in the amount of urine Side effects that usually do not require medical attention (report to your doctor or health care professional if they continue or are bothersome): -bone, joint, or muscle pain -constipation -diarrhea -fever -hair loss -irritation at site where injected -loss of appetite -nausea, vomiting -stomach upset -trouble sleeping -trouble swallowing -weak or tired This list may not describe all possible side effects. Call your doctor for medical advice about side effects. You may report side effects to FDA at 1-800-FDA-1088. Where should I keep my medicine? This drug is given in a hospital or clinic and will not be stored at home. NOTE: This sheet is a summary. It may not cover all possible information. If you have questions about this medicine, talk to your doctor, pharmacist, or health care provider.  2015, Elsevier/Gold Standard. (2013-02-23 10:03:48) Osteoporosis Throughout your life, your body breaks down old bone and replaces it with new bone. As you get older, your body does not replace bone as quickly as it breaks it down. By the age of 61 years, most people begin to gradually lose bone because of the imbalance between bone loss and replacement. Some people lose more bone than others. Bone loss beyond a specified normal degree is considered osteoporosis.  Osteoporosis affects the strength and durability of your bones. The inside of the ends of your bones and your flat bones, like the bones of your pelvis, look like honeycomb, filled with tiny open spaces. As bone loss occurs, your bones become less dense. This means that the open spaces inside your bones become bigger and the walls between these spaces become thinner. This makes your bones weaker. Bones of a person with osteoporosis can  become so weak that they can break (fracture) during minor accidents, such as a simple fall. CAUSES  The following factors have been associated with the development of osteoporosis:  Smoking.  Drinking more than 2 alcoholic drinks several days per week.  Long-term use of certain medicines:  Corticosteroids.  Chemotherapy medicines.  Thyroid medicines.  Antiepileptic medicines.  Gonadal hormone suppression medicine.  Immunosuppression medicine.  Being underweight.  Lack of physical activity.  Lack of exposure to the sun. This can lead to vitamin D deficiency.  Certain medical conditions:  Certain inflammatory bowel diseases, such as Crohn disease and ulcerative colitis.  Diabetes.  Hyperthyroidism.  Hyperparathyroidism. RISK FACTORS Anyone can develop osteoporosis. However, the following factors can increase your risk of developing osteoporosis:  Gender--Women are at higher risk than men.  Age--Being older than 50 years increases your risk.  Ethnicity--White and Asian people have an increased risk.  Weight --Being extremely underweight can increase your risk of osteoporosis.  Family history of osteoporosis--Having a family member who has developed osteoporosis can increase your risk. SYMPTOMS  Usually, people with osteoporosis have no symptoms.  DIAGNOSIS  Signs during a physical exam that may prompt your caregiver to suspect osteoporosis include:  Decreased height. This is usually caused by the compression of the bones that form your spine (vertebrae) because they have weakened and become fractured.  A curving or rounding of the upper back (kyphosis). To  confirm signs of osteoporosis, your caregiver may request a procedure that uses 2 low-dose X-ray beams with different levels of energy to measure your bone mineral density (dual-energy X-ray absorptiometry [DXA]). Also, your caregiver may check your level of vitamin D. TREATMENT  The goal of osteoporosis  treatment is to strengthen bones in order to decrease the risk of bone fractures. There are different types of medicines available to help achieve this goal. Some of these medicines work by slowing the processes of bone loss. Some medicines work by increasing bone density. Treatment also involves making sure that your levels of calcium and vitamin D are adequate. PREVENTION  There are things you can do to help prevent osteoporosis. Adequate intake of calcium and vitamin D can help you achieve optimal bone mineral density. Regular exercise can also help, especially resistance and weight-bearing activities. If you smoke, quitting smoking is an important part of osteoporosis prevention. MAKE SURE YOU:  Understand these instructions.  Will watch your condition.  Will get help right away if you are not doing well or get worse. FOR MORE INFORMATION www.osteo.org and EquipmentWeekly.com.ee Document Released: 06/20/2005 Document Revised: 01/05/2013 Document Reviewed: 08/25/2011 [facility] Patient Information 2015 [place], [place]. This information is not intended to replace advice given to you by your health care provider. Make sure you discuss any questions you have with your health care provider. Bone Densitometry Bone densitometry is a special X-ray that measures your bone density and can be used to help predict your risk of bone fractures. This test is used to determine bone mineral content and density to diagnose osteoporosis. Osteoporosis is the loss of bone that may cause the bone to become weak. Osteoporosis commonly occurs in women entering menopause. However, it may be found in men and in people with other diseases. PREPARATION FOR TEST No preparation necessary. WHO SHOULD BE TESTED?  All women older than 64.  Postmenopausal women (50 to 42) with risk factors for osteoporosis.  People with a previous fracture caused by normal activities.  People with a small body frame (less than 127 poundsor a body  mass index [BMI] of less than 21).  People who have a parent with a hip fracture or history of osteoporosis.  People who smoke.  People who have rheumatoid arthritis.  Anyone who engages in excessive alcohol use (more than 3 drinks most days).  Women who experience early menopause. WHEN SHOULD YOU BE RETESTED? Current guidelines suggest that you should wait at least 2 years before doing a bone density test again if your first test was normal.Recent studies indicated that women with normal bone density may be able to wait a few years before needing to repeat a bone density test. You should discuss this with your caregiver.  NORMAL FINDINGS   Normal: less than standard deviation below normal (greater than -1).  Osteopenia: 1 to 2.5 standard deviations below normal (-1 to -2.5).  Osteoporosis: greater than 2.5 standard deviations below normal (less than -2.5). Test results are reported as a "T score" and a "Z score."The T score is a number that compares your bone density with the bone density of healthy, young women.The Z score is a number that compares your bone density with the scores of women who are the same age, gender, and race.  Ranges for normal findings may vary among different laboratories and hospitals. You should always check with your doctor after having lab work or other tests done to discuss the meaning of your test results and whether your values  are considered within normal limits. MEANING OF TEST  Your caregiver will go over the test results with you and discuss the importance and meaning of your results, as well as treatment options and the need for additional tests if necessary. OBTAINING THE TEST RESULTS It is your responsibility to obtain your test results. Ask the lab or department performing the test when and how you will get your results. Document Released: 10/02/2004 Document Revised: 12/03/2011 Document Reviewed: 10/25/2010 [facility] Patient Information 2015  [place], [place]. This information is not intended to replace advice given to you by your health care provider. Make sure you discuss any questions you have with your health care provider.

## 2014-11-11 NOTE — Progress Notes (Signed)
Joyce Riley [birth year] [id number]   History:    81 y.o. for GYN followup exam. Patient was diagnosed with osteoporosis in 2012 and was started on Reclast.Her followup bone density study in 2013 demonstrated significant improvement of her distal one third of her left forearm . Her last bone density study demonstrated that her distal left one third radius had a T score of -2.5 and prior to treatment with Reclast it was -3 point one in 2012. Only her hips were able to be utilized for testing due to the fact that she has severe kyphosis or scoliosis making an unreliable for a BMD of the lower spine. She has tolerated the medication well. She is due for her next bone density study this year.Patient has a past history of benign colon polyps in [place]. Her colonoscopy and [place] was reported to be normal in 2011. Her last mammogram was normal November 2013. Dr. Alain Marion is her primary physician who has been treating her for hyperlipidemia as well as vitamin D deficiency. Patient denies any past history of Pap smears in the past when she was living in [place].   Past medical history,surgical history, family history and social history were all reviewed and documented in the EPIC chart.  Gynecologic History No LMP recorded. Patient is postmenopausal. Contraception: post menopausal status Last Pap: 2012. Results were: normal Last mammogram: 2015. Results were: Normal but dense  Obstetric History OB History  Gravida Para Term Preterm AB SAB TAB Ectopic Multiple Living  4 1 1  3 3    1     # Outcome Date GA Lbr Len/2nd Weight Sex Delivery Anes PTL Lv  4 SAB           3 SAB           2 SAB           1 Term     F Vag-Spont  N Y       ROS: A ROS was performed and pertinent positives and negatives are included in the history.  GENERAL: No fevers or chills. HEENT: No change in vision, no earache, sore throat or sinus congestion. NECK: No pain or stiffness. CARDIOVASCULAR: No chest pain or  pressure. No palpitations. PULMONARY: No shortness of breath, cough or wheeze. GASTROINTESTINAL: No abdominal pain, nausea, vomiting or diarrhea, melena or bright red blood per rectum. GENITOURINARY: No urinary frequency, urgency, hesitancy or dysuria. MUSCULOSKELETAL: No joint or muscle pain, no back pain, no recent trauma. DERMATOLOGIC: No rash, no itching, no lesions. ENDOCRINE: No polyuria, polydipsia, no heat or cold intolerance. No recent change in weight. HEMATOLOGICAL: No anemia or easy bruising or bleeding. NEUROLOGIC: No headache, seizures, numbness, tingling or weakness. PSYCHIATRIC: No depression, no loss of interest in normal activity or change in sleep pattern.     Exam: chaperone present  BP 130/88 mmHg  Ht 4\' 7"  (1.397 m)  Wt 122 lb (55.339 kg)  BMI 28.36 kg/m2  Body mass index is 28.36 kg/(m^2).  General appearance : Well developed well nourished female. No acute distress HEENT: Neck supple, trachea midline, no carotid bruits, no thyroidmegaly Lungs: Clear to auscultation, no rhonchi or wheezes, or rib retractions  Heart: Regular rate and rhythm, no murmurs or gallops Breast:Examined in sitting and supine position were symmetrical in appearance, no palpable masses or tenderness,  no skin retraction, no nipple inversion, no nipple discharge, no skin discoloration, no axillary or supraclavicular lymphadenopathy Abdomen: no palpable masses or tenderness, no rebound or guarding Extremities:  no edema or skin discoloration or tenderness  Pelvic:  Bartholin, Urethra, Skene Glands: Within normal limits             Vagina: No gross lesions or discharge, vaginal atrophy  Cervix: No gross lesions or discharge  Uterus  axial, normal size, shape and consistency, non-tender and mobile  Adnexa  Without masses or tenderness  Anus and perineum  normal   Rectovaginal  normal sphincter tone without palpated masses or tenderness             Hemoccult PCP provides     Assessment/Plan:   81 y.o. female for annual exam with history of osteoporosis and vitamin D deficiency. This will be her fourth year the patient receives the Reclast IV infusion. She is due to receive it in May of this year. We will check a BUN, creatinine, calcium and vitamin D level the week before. Pap smear no longer needed according to the new guidelines. We discussed importance of calcium and vitamin D and some form a weightbearing exercises 3 times a week. Her PCP we'll be doing her blood work. Vaccines are up-to-date.   Terrance Mass MD, 11:57 AM 11/11/2014

## 2014-11-19 ENCOUNTER — Ambulatory Visit: Payer: Medicare Other | Admitting: Internal Medicine

## 2014-11-25 ENCOUNTER — Ambulatory Visit (INDEPENDENT_AMBULATORY_CARE_PROVIDER_SITE_OTHER): Payer: Medicare Other

## 2014-11-25 DIAGNOSIS — M810 Age-related osteoporosis without current pathological fracture: Secondary | ICD-10-CM

## 2014-11-25 DIAGNOSIS — Z780 Asymptomatic menopausal state: Secondary | ICD-10-CM

## 2014-11-30 ENCOUNTER — Telehealth: Payer: Self-pay | Admitting: Gynecology

## 2014-11-30 DIAGNOSIS — M810 Age-related osteoporosis without current pathological fracture: Secondary | ICD-10-CM

## 2014-11-30 NOTE — Telephone Encounter (Signed)
Left message regarding Reclast for 2016 for Joyce Riley. Last infusion was in June 2015. She will need blood levels after Feb 14 2015. Dr Toney Rakes has ordered BUN, Creatinine and Calcium level prior to Reclast.

## 2014-12-02 ENCOUNTER — Telehealth: Payer: Self-pay | Admitting: *Deleted

## 2014-12-02 DIAGNOSIS — M810 Age-related osteoporosis without current pathological fracture: Secondary | ICD-10-CM

## 2014-12-02 NOTE — Telephone Encounter (Signed)
Left message for pt to call.

## 2014-12-02 NOTE — Telephone Encounter (Signed)
Yes that would be great thank you! 

## 2014-12-02 NOTE — Telephone Encounter (Signed)
This patient is due for reclast in May she needs BUN,creatine and calcium for relcast. Pt also needs Ca, Vitamin D and PTH level for bones due to osteoporosis. Okay if patient can have on this done at once in may? Please advise

## 2014-12-07 ENCOUNTER — Ambulatory Visit (INDEPENDENT_AMBULATORY_CARE_PROVIDER_SITE_OTHER): Payer: Medicare Other | Admitting: Internal Medicine

## 2014-12-07 ENCOUNTER — Encounter: Payer: Self-pay | Admitting: Internal Medicine

## 2014-12-07 VITALS — BP 120/80 | HR 76 | Temp 98.6°F | Wt 123.0 lb

## 2014-12-07 DIAGNOSIS — E538 Deficiency of other specified B group vitamins: Secondary | ICD-10-CM

## 2014-12-07 DIAGNOSIS — E559 Vitamin D deficiency, unspecified: Secondary | ICD-10-CM | POA: Diagnosis not present

## 2014-12-07 DIAGNOSIS — L500 Allergic urticaria: Secondary | ICD-10-CM

## 2014-12-07 DIAGNOSIS — K2100 Gastro-esophageal reflux disease with esophagitis, without bleeding: Secondary | ICD-10-CM

## 2014-12-07 MED ORDER — RANITIDINE HCL 150 MG PO TABS
150.0000 mg | ORAL_TABLET | Freq: Every day | ORAL | Status: DC
Start: 2014-12-07 — End: 2015-10-24

## 2014-12-07 NOTE — Assessment & Plan Note (Signed)
Tylenol prn 

## 2014-12-07 NOTE — Progress Notes (Signed)
Pre visit review using our clinic review tool, if applicable. No additional management support is needed unless otherwise documented below in the visit note. 

## 2014-12-07 NOTE — Assessment & Plan Note (Signed)
Ranitidine po qd

## 2014-12-07 NOTE — Progress Notes (Signed)
   Subjective:    HPI   The patient is here to follow up on chronic  anxiety,  B12 and Vit D def, scoliosis/LBP, GERD, osteoporosis symptoms controlled with medicines, diet and exercise. Diarrhea is better - pt is on gluten free diet F/u fatigue. She had a couple episode when R grip got weak (once in a dentist's chair) F/u occ HAs off and on x 2 mo F/u OA - started glucosamine and stopped Vit D and B12...  BP Readings from Last 3 Encounters:  12/07/14 120/80  11/11/14 130/88  08/16/14 130/84   Wt Readings from Last 3 Encounters:  12/07/14 123 lb (55.792 kg)  11/11/14 122 lb (55.339 kg)  08/16/14 123 lb (55.792 kg)     Review of Systems  Constitutional: Negative for chills, activity change, appetite change and unexpected weight change.  HENT: Negative for mouth sores and sinus pressure.   Eyes: Negative for visual disturbance.  Respiratory: Negative for chest tightness.   Gastrointestinal: Negative for nausea and abdominal pain.       Stool incontinence - normal  Genitourinary: Negative for frequency, difficulty urinating and vaginal pain.  Musculoskeletal: Positive for back pain. Negative for gait problem.  Skin: Negative for pallor.  Neurological: Negative for dizziness, tremors, weakness, numbness and headaches.  Psychiatric/Behavioral: Negative for confusion and sleep disturbance.       Objective:   Physical Exam  Constitutional: She appears well-developed. No distress.  HENT:  Head: Normocephalic.  Right Ear: External ear normal.  Left Ear: External ear normal.  Nose: Nose normal.  Mouth/Throat: Oropharynx is clear and moist.  Eyes: Conjunctivae are normal. Pupils are equal, round, and reactive to light. Right eye exhibits no discharge. Left eye exhibits no discharge.  Neck: Normal range of motion. Neck supple. No JVD present. No tracheal deviation present. No thyromegaly present.  Cardiovascular: Normal rate, regular rhythm and normal heart sounds.    Pulmonary/Chest: No stridor. No respiratory distress. She has no wheezes.  Abdominal: Soft. Bowel sounds are normal. She exhibits no distension and no mass. There is no tenderness. There is no rebound and no guarding.  Musculoskeletal: She exhibits no edema or tenderness.  Lymphadenopathy:    She has no cervical adenopathy.  Neurological: She displays normal reflexes. No cranial nerve deficit. She exhibits normal muscle tone. Coordination normal.  Skin: No rash noted. No erythema.  Psychiatric: She has a normal mood and affect. Her behavior is normal. Judgment and thought content normal.  C spine, B shoulders ok B grips are nl, no muscle atrophy Head - NT  Lab Results  Component Value Date   WBC 6.3 08/16/2014   HGB 12.5 08/16/2014   HCT 37.2 08/16/2014   PLT 212.0 08/16/2014   GLUCOSE 83 08/16/2014   CHOL 240* 02/16/2014   TRIG 90.0 02/16/2014   HDL 50.60 02/16/2014   LDLDIRECT 196.7 12/22/2010   LDLCALC 171* 02/16/2014   ALT 11 02/16/2014   AST 14 02/16/2014   NA 141 08/16/2014   K 4.1 08/16/2014   CL 103 08/16/2014   CREATININE 0.9 08/16/2014   BUN 12 08/16/2014   CO2 28 08/16/2014   TSH 1.65 02/16/2014   INR 0.96 03/24/2010   HGBA1C 5.7 04/26/2010        Assessment & Plan:

## 2014-12-07 NOTE — Assessment & Plan Note (Signed)
Vit B12 po

## 2014-12-07 NOTE — Assessment & Plan Note (Signed)
Vit D 

## 2014-12-07 NOTE — Assessment & Plan Note (Signed)
9/14 new ?etiol ?gluten sensitivity

## 2014-12-08 NOTE — Telephone Encounter (Signed)
Joyce Riley, this patient needs her Reclast infusion in May. Patient has history of osteoporosis and vitamin D deficiency. If you could schedule it for the second week in May. You need to contact her for the first week of May so we can get the following blood work drawn: BUN, creatinine, calcium and vitamin D level thank you  Note from Dr Demetria Pore this is the noted from [place] in 02, I just did her BD, She only speaks [place] , ???? You will be receiving her BD report with a note from [place], can we do all of this blood work at the same time . Reclast pts must have blood work within 30 days of infusion, Could we schedule all of this at the same time. She is very compliant but has to have her husband and the interp. With her. Just wanted [place] to work together for her.   Thanks for your help with this. She is not due for RECLAST until after 03/02/2015. NOT MAY. Just let me know what to do. I will not call until May for her approval. I have to call her insurance company. So bloodwork must be after 05/23 due to getting her on schedule at [facility] for infusion.    JF said this would be fine, I left message for pt to call me.  Note from [place].

## 2014-12-13 NOTE — Telephone Encounter (Signed)
Pt daughter [name] informed with the below and they will return in may for blood work

## 2015-02-16 NOTE — Telephone Encounter (Signed)
Talked with [name] her daughter . She will bring Karletta in on June 6 at 10 am for labwork. After we receive labs we will schedule her Reclast.

## 2015-02-24 NOTE — Telephone Encounter (Signed)
Dr Toney Rakes Patient is for Reclast in June. Coming in for labs June 6th , I noted in chart medication that she is not taking calcium and Vit D. If patient it not taking the instruction sheet states that she must take prior to taking Reclast. She was taken off Vit D in March by PCP. Please advise.

## 2015-02-24 NOTE — Telephone Encounter (Signed)
Regardless of the Reclast infusion she should stay on calcium and vitamin d daily

## 2015-02-28 ENCOUNTER — Other Ambulatory Visit: Payer: Medicare Other

## 2015-02-28 DIAGNOSIS — M810 Age-related osteoporosis without current pathological fracture: Secondary | ICD-10-CM

## 2015-03-01 LAB — VITAMIN D 25 HYDROXY (VIT D DEFICIENCY, FRACTURES): Vit D, 25-Hydroxy: 30 ng/mL (ref 30–100)

## 2015-03-01 LAB — PTH, INTACT AND CALCIUM
Calcium: 8.9 mg/dL (ref 8.4–10.5)
PTH: 81 pg/mL — ABNORMAL HIGH (ref 14–64)

## 2015-03-02 ENCOUNTER — Other Ambulatory Visit: Payer: Self-pay | Admitting: Gynecology

## 2015-03-02 DIAGNOSIS — E213 Hyperparathyroidism, unspecified: Secondary | ICD-10-CM

## 2015-03-02 LAB — BUN: BUN: 11 mg/dL (ref 6–23)

## 2015-03-02 LAB — CREATININE, SERUM: CREATININE: 0.83 mg/dL (ref 0.50–1.10)

## 2015-03-02 NOTE — Progress Notes (Signed)
Correct

## 2015-03-04 NOTE — Telephone Encounter (Signed)
Lab work 02/28/15   BUN  11   Creatinine 0.83    Calcium 8.9 will call her daughter to set up date and time for Reclast

## 2015-03-08 NOTE — Telephone Encounter (Signed)
Patient is scheduled for 03/16/15 at 11:00 am at the [facility] . I discuss all information with patient's daughter [name]. I instructed the [facility] outpatient center that Salvatrice will need interpreter and also gave this phone number to daughter to confirm interpreter. Faxed form to center. Mailed instruction sheet to patient.

## 2015-03-15 ENCOUNTER — Other Ambulatory Visit (HOSPITAL_COMMUNITY): Payer: Self-pay

## 2015-03-16 ENCOUNTER — Encounter (HOSPITAL_COMMUNITY)
Admission: RE | Admit: 2015-03-16 | Discharge: 2015-03-16 | Disposition: A | Discharge status: Home / Self Care | Payer: Medicare Other | Source: Ambulatory Visit | Attending: Gynecology | Admitting: Gynecology

## 2015-03-16 DIAGNOSIS — M810 Age-related osteoporosis without current pathological fracture: Secondary | ICD-10-CM | POA: Diagnosis not present

## 2015-03-16 MED ORDER — ZOLEDRONIC ACID 5 MG/100ML IV SOLN
INTRAVENOUS | Status: AC
  Administered 2015-03-16: 5 mg
  Filled 2015-03-16: qty 100

## 2015-03-16 MED ORDER — ZOLEDRONIC ACID 5 MG/100ML IV SOLN: 5.0000 mg | Freq: Once | INTRAVENOUS | Status: DC

## 2015-03-17 NOTE — Telephone Encounter (Signed)
Patient received Reclast on 03/16/2015 at [facility] out pt. next due in 2017.

## 2015-03-21 ENCOUNTER — Ambulatory Visit (INDEPENDENT_AMBULATORY_CARE_PROVIDER_SITE_OTHER): Payer: Medicare Other | Admitting: Internal Medicine

## 2015-03-21 ENCOUNTER — Encounter: Payer: Self-pay | Admitting: Internal Medicine

## 2015-03-21 VITALS — BP 130/84 | HR 72 | Wt 122.0 lb

## 2015-03-21 DIAGNOSIS — M5440 Lumbago with sciatica, unspecified side: Secondary | ICD-10-CM | POA: Diagnosis not present

## 2015-03-21 DIAGNOSIS — E538 Deficiency of other specified B group vitamins: Secondary | ICD-10-CM

## 2015-03-21 DIAGNOSIS — E559 Vitamin D deficiency, unspecified: Secondary | ICD-10-CM

## 2015-03-21 DIAGNOSIS — Z23 Encounter for immunization: Secondary | ICD-10-CM | POA: Diagnosis not present

## 2015-03-21 DIAGNOSIS — M810 Age-related osteoporosis without current pathological fracture: Secondary | ICD-10-CM | POA: Diagnosis not present

## 2015-03-21 MED ORDER — TRIAMCINOLONE ACETONIDE 0.5 % EX OINT: 1.0000 "application " | TOPICAL_OINTMENT | Freq: Two times a day (BID) | CUTANEOUS | Status: DC

## 2015-03-21 NOTE — Assessment & Plan Note (Signed)
On Reclast

## 2015-03-21 NOTE — Assessment & Plan Note (Signed)
Stable

## 2015-03-21 NOTE — Progress Notes (Signed)
Pre visit review using our clinic review tool, if applicable. No additional management support is needed unless otherwise documented below in the visit note. 

## 2015-03-21 NOTE — Assessment & Plan Note (Signed)
On B12 

## 2015-03-21 NOTE — Assessment & Plan Note (Signed)
On Vit D 

## 2015-03-21 NOTE — Progress Notes (Signed)
   Subjective:    HPI   The patient is here to follow up on chronic  anxiety,  B12 and Vit D def, scoliosis/LBP, GERD, osteoporosis symptoms controlled with medicines, diet and exercise. Diarrhea is better - pt is on gluten free diet F/u fatigue.  Pt is separated now. F/u occ HAs off and on x 2 mo F/u OA - started glucosamine and stopped Vit D and B12...  BP Readings from Last 3 Encounters:  03/21/15 130/84  03/16/15 163/82  12/07/14 120/80   Wt Readings from Last 3 Encounters:  03/21/15 122 lb (55.339 kg)  03/16/15 121 lb 14.6 oz (55.3 kg)  12/07/14 123 lb (55.792 kg)     Review of Systems  Constitutional: Negative for chills, activity change, appetite change and unexpected weight change.  HENT: Negative for mouth sores and sinus pressure.   Eyes: Negative for visual disturbance.  Respiratory: Negative for chest tightness.   Gastrointestinal: Negative for nausea and abdominal pain.       Stool incontinence - normal  Genitourinary: Negative for frequency, difficulty urinating and vaginal pain.  Musculoskeletal: Positive for back pain. Negative for gait problem.  Skin: Negative for pallor.  Neurological: Negative for dizziness, tremors, weakness, numbness and headaches.  Psychiatric/Behavioral: Negative for confusion and sleep disturbance.       Objective:   Physical Exam  Constitutional: She appears well-developed. No distress.  HENT:  Head: Normocephalic.  Right Ear: External ear normal.  Left Ear: External ear normal.  Nose: Nose normal.  Mouth/Throat: Oropharynx is clear and moist.  Eyes: Conjunctivae are normal. Pupils are equal, round, and reactive to light. Right eye exhibits no discharge. Left eye exhibits no discharge.  Neck: Normal range of motion. Neck supple. No JVD present. No tracheal deviation present. No thyromegaly present.  Cardiovascular: Normal rate, regular rhythm and normal heart sounds.   Pulmonary/Chest: No stridor. No respiratory distress.  She has no wheezes.  Abdominal: Soft. Bowel sounds are normal. She exhibits no distension and no mass. There is no tenderness. There is no rebound and no guarding.  Musculoskeletal: She exhibits no edema or tenderness.  Lymphadenopathy:    She has no cervical adenopathy.  Neurological: She displays normal reflexes. No cranial nerve deficit. She exhibits normal muscle tone. Coordination normal.  Skin: No rash noted. No erythema.  Psychiatric: She has a normal mood and affect. Her behavior is normal. Judgment and thought content normal.  C spine, B shoulders ok B grips are nl, no muscle atrophy Head - NT 2 patches on upper back w/hyperpigm (20+ years)  Lab Results  Component Value Date   WBC 6.3 08/16/2014   HGB 12.5 08/16/2014   HCT 37.2 08/16/2014   PLT 212.0 08/16/2014   GLUCOSE 83 08/16/2014   CHOL 240* 02/16/2014   TRIG 90.0 02/16/2014   HDL 50.60 02/16/2014   LDLDIRECT 196.7 12/22/2010   LDLCALC 171* 02/16/2014   ALT 11 02/16/2014   AST 14 02/16/2014   NA 141 08/16/2014   K 4.1 08/16/2014   CL 103 08/16/2014   CREATININE 0.83 02/28/2015   BUN 11 02/28/2015   CO2 28 08/16/2014   TSH 1.65 02/16/2014   INR 0.96 03/24/2010   HGBA1C 5.7 04/26/2010        Assessment & Plan:

## 2015-03-30 ENCOUNTER — Other Ambulatory Visit: Payer: Medicare Other

## 2015-03-30 DIAGNOSIS — E213 Hyperparathyroidism, unspecified: Secondary | ICD-10-CM | POA: Diagnosis not present

## 2015-03-30 DIAGNOSIS — M810 Age-related osteoporosis without current pathological fracture: Secondary | ICD-10-CM

## 2015-03-31 LAB — PARATHYROID HORMONE, INTACT (NO CA): PTH: 39 pg/mL (ref 14–64)

## 2015-05-10 DIAGNOSIS — Z1211 Encounter for screening for malignant neoplasm of colon: Secondary | ICD-10-CM | POA: Diagnosis not present

## 2015-05-10 DIAGNOSIS — K625 Hemorrhage of anus and rectum: Secondary | ICD-10-CM | POA: Diagnosis not present

## 2015-05-10 DIAGNOSIS — R1013 Epigastric pain: Secondary | ICD-10-CM | POA: Diagnosis not present

## 2015-05-10 DIAGNOSIS — Z86010 Personal history of colonic polyps: Secondary | ICD-10-CM | POA: Diagnosis not present

## 2015-05-25 DIAGNOSIS — H35363 Drusen (degenerative) of macula, bilateral: Secondary | ICD-10-CM | POA: Diagnosis not present

## 2015-05-25 DIAGNOSIS — H35361 Drusen (degenerative) of macula, right eye: Secondary | ICD-10-CM | POA: Diagnosis not present

## 2015-05-25 DIAGNOSIS — H40023 Open angle with borderline findings, high risk, bilateral: Secondary | ICD-10-CM | POA: Diagnosis not present

## 2015-05-25 DIAGNOSIS — H35373 Puckering of macula, bilateral: Secondary | ICD-10-CM | POA: Diagnosis not present

## 2015-05-25 DIAGNOSIS — Z961 Presence of intraocular lens: Secondary | ICD-10-CM | POA: Diagnosis not present

## 2015-06-15 DIAGNOSIS — Z86010 Personal history of colonic polyps: Secondary | ICD-10-CM | POA: Diagnosis not present

## 2015-06-15 DIAGNOSIS — K635 Polyp of colon: Secondary | ICD-10-CM | POA: Diagnosis not present

## 2015-06-15 DIAGNOSIS — Z1211 Encounter for screening for malignant neoplasm of colon: Secondary | ICD-10-CM | POA: Diagnosis not present

## 2015-06-15 DIAGNOSIS — K2970 Gastritis, unspecified, without bleeding: Secondary | ICD-10-CM | POA: Diagnosis not present

## 2015-06-15 DIAGNOSIS — R1013 Epigastric pain: Secondary | ICD-10-CM | POA: Diagnosis not present

## 2015-06-15 DIAGNOSIS — K219 Gastro-esophageal reflux disease without esophagitis: Secondary | ICD-10-CM | POA: Diagnosis not present

## 2015-06-15 DIAGNOSIS — D122 Benign neoplasm of ascending colon: Secondary | ICD-10-CM | POA: Diagnosis not present

## 2015-06-24 ENCOUNTER — Other Ambulatory Visit (INDEPENDENT_AMBULATORY_CARE_PROVIDER_SITE_OTHER): Payer: Medicare Other

## 2015-06-24 ENCOUNTER — Encounter: Payer: Self-pay | Admitting: Internal Medicine

## 2015-06-24 ENCOUNTER — Ambulatory Visit (INDEPENDENT_AMBULATORY_CARE_PROVIDER_SITE_OTHER): Payer: Medicare Other | Admitting: Internal Medicine

## 2015-06-24 VITALS — BP 130/64 | HR 66 | Wt 119.0 lb

## 2015-06-24 DIAGNOSIS — Z860100 Personal history of colon polyps, unspecified: Secondary | ICD-10-CM

## 2015-06-24 DIAGNOSIS — Z23 Encounter for immunization: Secondary | ICD-10-CM | POA: Diagnosis not present

## 2015-06-24 DIAGNOSIS — K5730 Diverticulosis of large intestine without perforation or abscess without bleeding: Secondary | ICD-10-CM

## 2015-06-24 DIAGNOSIS — R42 Dizziness and giddiness: Secondary | ICD-10-CM

## 2015-06-24 DIAGNOSIS — R05 Cough: Secondary | ICD-10-CM

## 2015-06-24 DIAGNOSIS — K2100 Gastro-esophageal reflux disease with esophagitis, without bleeding: Secondary | ICD-10-CM

## 2015-06-24 DIAGNOSIS — E559 Vitamin D deficiency, unspecified: Secondary | ICD-10-CM

## 2015-06-24 DIAGNOSIS — R059 Cough, unspecified: Secondary | ICD-10-CM

## 2015-06-24 DIAGNOSIS — E538 Deficiency of other specified B group vitamins: Secondary | ICD-10-CM

## 2015-06-24 LAB — VITAMIN B12: Vitamin B-12: 1129 pg/mL — ABNORMAL HIGH (ref 211–911)

## 2015-06-24 LAB — BASIC METABOLIC PANEL
BUN: 10 mg/dL (ref 6–23)
CALCIUM: 9 mg/dL (ref 8.4–10.5)
CO2: 31 meq/L (ref 19–32)
Chloride: 104 mEq/L (ref 96–112)
Creatinine, Ser: 0.96 mg/dL (ref 0.40–1.20)
GFR: 59.56 mL/min — ABNORMAL LOW (ref >60.00)
GLUCOSE: 93 mg/dL (ref 70–99)
POTASSIUM: 3.9 meq/L (ref 3.5–5.1)
Sodium: 141 mEq/L (ref 135–145)

## 2015-06-24 MED ORDER — DEXLANSOPRAZOLE 30 MG PO CPDR: 30.0000 mg | DELAYED_RELEASE_CAPSULE | Freq: Every day | ORAL | Status: DC

## 2015-06-24 MED ORDER — MECLIZINE HCL 12.5 MG PO TABS: 12.5000 mg | ORAL_TABLET | Freq: Three times a day (TID) | ORAL | Status: DC | PRN

## 2015-06-24 NOTE — Progress Notes (Signed)
Pre visit review using our clinic review tool, if applicable. No additional management support is needed unless otherwise documented below in the visit note. 

## 2015-06-24 NOTE — Assessment & Plan Note (Signed)
Chronic on Zantac  Dr Collene Mares 9/16 EGD - needs PPI added

## 2015-06-24 NOTE — Assessment & Plan Note (Signed)
Dr Collene Mares Last colon 9/16

## 2015-06-24 NOTE — Assessment & Plan Note (Signed)
On Vit D 

## 2015-06-24 NOTE — Assessment & Plan Note (Signed)
BPV 

## 2015-06-24 NOTE — Assessment & Plan Note (Signed)
?  GERD related; 9/16 - added PPI

## 2015-06-24 NOTE — Assessment & Plan Note (Signed)
On B12 labs

## 2015-06-24 NOTE — Progress Notes (Signed)
Subjective:  Patient ID: Joyce Riley, female    DOB: 1934-06-11  Age: 81 y.o. MRN: [id number]  CC: No chief complaint on file.   HPI Joyce Riley presents for cough due to GERD, B12 def, colon polyps, HH f/u. C/o dizzy spells (BPV)- chronic and recurrent  Outpatient Prescriptions Prior to Visit  Medication Sig Dispense Refill  . Cholecalciferol 1000 UNITS tablet Take 1 tablet (1,000 Units total) by mouth daily. 100 tablet 3  . cyanocobalamin 500 MCG tablet Take 1 tablet (500 mcg total) by mouth daily. 100 tablet 3  . loratadine (CLARITIN) 10 MG tablet Take 1 tablet (10 mg total) by mouth daily as needed for allergies. 30 tablet 3  . ranitidine (ZANTAC) 150 MG tablet Take 1 tablet (150 mg total) by mouth at bedtime. 90 tablet 3  . triamcinolone ointment (KENALOG) 0.5 % Apply 1 application topically 2 (two) times daily. 60 g 3  . zoledronic acid (RECLAST) 5 MG/100ML SOLN Inject 100 mLs (5 mg total) into the vein once. q 12 mo started in 2012 per gyn 100 mL 0   No facility-administered medications prior to visit.    ROS Review of Systems  Constitutional: Negative for chills, activity change, appetite change, fatigue and unexpected weight change.  HENT: Negative for congestion, mouth sores and sinus pressure.   Eyes: Negative for visual disturbance.  Respiratory: Negative for cough and chest tightness.   Gastrointestinal: Negative for nausea and abdominal pain.  Genitourinary: Negative for frequency, difficulty urinating and vaginal pain.  Musculoskeletal: Negative for back pain and gait problem.  Skin: Negative for pallor and rash.  Neurological: Positive for dizziness. Negative for tremors, weakness, numbness and headaches.  Psychiatric/Behavioral: Negative for suicidal ideas, confusion and sleep disturbance.    Objective:  BP 130/64 mmHg  Pulse 66  Wt 119 lb (53.978 kg)  SpO2 96%  BP Readings from Last 3 Encounters:  06/24/15 130/64  03/21/15 130/84  03/16/15 163/82     Wt Readings from Last 3 Encounters:  06/24/15 119 lb (53.978 kg)  03/21/15 122 lb (55.339 kg)  03/16/15 121 lb 14.6 oz (55.3 kg)    Physical Exam  Constitutional: She appears well-developed. No distress.  HENT:  Head: Normocephalic.  Right Ear: External ear normal.  Left Ear: External ear normal.  Nose: Nose normal.  Mouth/Throat: Oropharynx is clear and moist.  Eyes: Conjunctivae are normal. Pupils are equal, round, and reactive to light. Right eye exhibits no discharge. Left eye exhibits no discharge.  Neck: Normal range of motion. Neck supple. No JVD present. No tracheal deviation present. No thyromegaly present.  Cardiovascular: Normal rate, regular rhythm and normal heart sounds.   Pulmonary/Chest: No stridor. No respiratory distress. She has no wheezes.  Abdominal: Soft. Bowel sounds are normal. She exhibits no distension and no mass. There is no tenderness. There is no rebound and no guarding.  Musculoskeletal: She exhibits no edema or tenderness.  Lymphadenopathy:    She has no cervical adenopathy.  Neurological: She displays normal reflexes. No cranial nerve deficit. She exhibits normal muscle tone. Coordination abnormal.  Skin: No rash noted. No erythema.  Psychiatric: She has a normal mood and affect. Her behavior is normal. Judgment and thought content normal.    Lab Results  Component Value Date   WBC 6.3 08/16/2014   HGB 12.5 08/16/2014   HCT 37.2 08/16/2014   PLT 212.0 08/16/2014   GLUCOSE 93 06/24/2015   CHOL 240* 02/16/2014   TRIG 90.0 02/16/2014   HDL 50.60  02/16/2014   LDLDIRECT 196.7 12/22/2010   LDLCALC 171* 02/16/2014   ALT 11 02/16/2014   AST 14 02/16/2014   NA 141 06/24/2015   K 3.9 06/24/2015   CL 104 06/24/2015   CREATININE 0.96 06/24/2015   BUN 10 06/24/2015   CO2 31 06/24/2015   TSH 1.65 02/16/2014   INR 0.96 03/24/2010   HGBA1C 5.7 04/26/2010    No results found.  Assessment & Plan:   Diagnoses and all orders for this  visit:  B12 deficiency -     Vitamin B12; Future -     Basic metabolic panel; Future  Gastroesophageal reflux disease with esophagitis -     Vitamin B12; Future -     Basic metabolic panel; Future  Vitamin D deficiency -     Vitamin B12; Future -     Basic metabolic panel; Future  History of colonic polyps -     Vitamin B12; Future -     Basic metabolic panel; Future  Diverticulosis of colon without hemorrhage -     Vitamin B12; Future -     Basic metabolic panel; Future  Cough -     Vitamin B12; Future -     Basic metabolic panel; Future  VERTIGO -     Vitamin B12; Future -     Basic metabolic panel; Future  Need for influenza vaccination -     Flu Vaccine QUAD 36+ mos IM  Other orders -     Dexlansoprazole (DEXILANT) 30 MG capsule; Take 1 capsule (30 mg total) by mouth daily. -     meclizine (ANTIVERT) 12.5 MG tablet; Take 1 tablet (12.5 mg total) by mouth 3 (three) times daily as needed for dizziness or nausea.   I am having [name] start on Dexlansoprazole and meclizine. I am also having her maintain her Cholecalciferol, cyanocobalamin, zoledronic acid, loratadine, ranitidine, and triamcinolone ointment.  Meds ordered this encounter  Medications  . Dexlansoprazole (DEXILANT) 30 MG capsule    Sig: Take 1 capsule (30 mg total) by mouth daily.    Dispense:  90 capsule    Refill:  3  . meclizine (ANTIVERT) 12.5 MG tablet    Sig: Take 1 tablet (12.5 mg total) by mouth 3 (three) times daily as needed for dizziness or nausea.    Dispense:  60 tablet    Refill:  2     Follow-up: Return in about 4 months (around 10/24/2015) for a follow-up visit.  Walker Kehr, MD

## 2015-06-27 ENCOUNTER — Encounter: Payer: Self-pay | Admitting: Physical Medicine and Rehabilitation

## 2015-08-09 ENCOUNTER — Other Ambulatory Visit: Payer: Self-pay

## 2015-08-09 DIAGNOSIS — Z1231 Encounter for screening mammogram for malignant neoplasm of breast: Secondary | ICD-10-CM

## 2015-09-02 ENCOUNTER — Ambulatory Visit
Admission: RE | Admit: 2015-09-02 | Discharge: 2015-09-02 | Disposition: A | Discharge status: Home / Self Care | Payer: Medicare Other | Source: Ambulatory Visit

## 2015-09-02 DIAGNOSIS — Z1231 Encounter for screening mammogram for malignant neoplasm of breast: Secondary | ICD-10-CM | POA: Diagnosis not present

## 2015-10-24 ENCOUNTER — Encounter: Payer: Self-pay | Admitting: Internal Medicine

## 2015-10-24 ENCOUNTER — Ambulatory Visit (INDEPENDENT_AMBULATORY_CARE_PROVIDER_SITE_OTHER): Payer: Medicare Other | Admitting: Internal Medicine

## 2015-10-24 VITALS — BP 140/88 | HR 73 | Wt 129.0 lb

## 2015-10-24 DIAGNOSIS — M810 Age-related osteoporosis without current pathological fracture: Secondary | ICD-10-CM | POA: Diagnosis not present

## 2015-10-24 DIAGNOSIS — K2100 Gastro-esophageal reflux disease with esophagitis, without bleeding: Secondary | ICD-10-CM

## 2015-10-24 DIAGNOSIS — M5440 Lumbago with sciatica, unspecified side: Secondary | ICD-10-CM | POA: Diagnosis not present

## 2015-10-24 DIAGNOSIS — G8929 Other chronic pain: Secondary | ICD-10-CM

## 2015-10-24 DIAGNOSIS — E559 Vitamin D deficiency, unspecified: Secondary | ICD-10-CM

## 2015-10-24 DIAGNOSIS — E785 Hyperlipidemia, unspecified: Secondary | ICD-10-CM

## 2015-10-24 DIAGNOSIS — K9049 Malabsorption due to intolerance, not elsewhere classified: Secondary | ICD-10-CM

## 2015-10-24 MED ORDER — CALCIUM CARBONATE-VITAMIN D 500-200 MG-UNIT PO TABS: 1.0000 | ORAL_TABLET | Freq: Two times a day (BID) | ORAL | Status: DC

## 2015-10-24 NOTE — Assessment & Plan Note (Signed)
Vit D 

## 2015-10-24 NOTE — Assessment & Plan Note (Signed)
Tylenol prn 

## 2015-10-24 NOTE — Assessment & Plan Note (Signed)
Off milk

## 2015-10-24 NOTE — Assessment & Plan Note (Signed)
  On diet  

## 2015-10-24 NOTE — Assessment & Plan Note (Signed)
On Reclast  and Ca w/Vit D

## 2015-10-24 NOTE — Assessment & Plan Note (Signed)
D/c Zantac, 2016 - Dexilant - risks discussed

## 2015-10-24 NOTE — Progress Notes (Signed)
Subjective:  Patient ID: Joyce Riley, female    DOB: 1934/05/10  Age: 82 y.o. MRN: [id number]  CC: No chief complaint on file.   HPI Joyce Riley presents for LBP, GERD, fatigue f/u. Pt is dizzy at times when looking up  Outpatient Prescriptions Prior to Visit  Medication Sig Dispense Refill  . Cholecalciferol 1000 UNITS tablet Take 1 tablet (1,000 Units total) by mouth daily. 100 tablet 3  . cyanocobalamin 500 MCG tablet Take 1 tablet (500 mcg total) by mouth daily. 100 tablet 3  . Dexlansoprazole (DEXILANT) 30 MG capsule Take 1 capsule (30 mg total) by mouth daily. 90 capsule 3  . loratadine (CLARITIN) 10 MG tablet Take 1 tablet (10 mg total) by mouth daily as needed for allergies. 30 tablet 3  . meclizine (ANTIVERT) 12.5 MG tablet Take 1 tablet (12.5 mg total) by mouth 3 (three) times daily as needed for dizziness or nausea. 60 tablet 2  . triamcinolone ointment (KENALOG) 0.5 % Apply 1 application topically 2 (two) times daily. 60 g 3  . zoledronic acid (RECLAST) 5 MG/100ML SOLN Inject 100 mLs (5 mg total) into the vein once. q 12 mo started in 2012 per gyn 100 mL 0  . ranitidine (ZANTAC) 150 MG tablet Take 1 tablet (150 mg total) by mouth at bedtime. 90 tablet 3   No facility-administered medications prior to visit.    ROS Review of Systems  Constitutional: Negative for chills, activity change, appetite change, fatigue and unexpected weight change.  HENT: Negative for congestion, mouth sores and sinus pressure.   Eyes: Negative for visual disturbance.  Respiratory: Negative for cough and chest tightness.   Cardiovascular: Negative for palpitations.  Gastrointestinal: Negative for nausea and abdominal pain.  Genitourinary: Negative for frequency, difficulty urinating and vaginal pain.  Musculoskeletal: Positive for back pain. Negative for gait problem.  Skin: Negative for pallor and rash.  Neurological: Negative for dizziness, tremors, weakness, numbness and headaches.    Psychiatric/Behavioral: Negative for suicidal ideas, confusion and sleep disturbance.    Objective:  BP 140/88 mmHg  Pulse 73  Wt 129 lb (58.514 kg)  SpO2 97%  BP Readings from Last 3 Encounters:  10/24/15 140/88  06/24/15 130/64  03/21/15 130/84    Wt Readings from Last 3 Encounters:  10/24/15 129 lb (58.514 kg)  06/24/15 119 lb (53.978 kg)  03/21/15 122 lb (55.339 kg)    Physical Exam  Constitutional: She appears well-developed. No distress.  HENT:  Head: Normocephalic.  Right Ear: External ear normal.  Left Ear: External ear normal.  Nose: Nose normal.  Mouth/Throat: Oropharynx is clear and moist.  Eyes: Conjunctivae are normal. Pupils are equal, round, and reactive to light. Right eye exhibits no discharge. Left eye exhibits no discharge.  Neck: Normal range of motion. Neck supple. No JVD present. No tracheal deviation present. No thyromegaly present.  Cardiovascular: Normal rate, regular rhythm and normal heart sounds.   Pulmonary/Chest: No stridor. No respiratory distress. She has no wheezes.  Abdominal: Soft. Bowel sounds are normal. She exhibits no distension and no mass. There is no tenderness. There is no rebound and no guarding.  Musculoskeletal: She exhibits tenderness. She exhibits no edema.  Lymphadenopathy:    She has no cervical adenopathy.  Neurological: She displays normal reflexes. No cranial nerve deficit. She exhibits normal muscle tone. Coordination normal.  Skin: No rash noted. No erythema.  Psychiatric: She has a normal mood and affect. Her behavior is normal. Judgment and thought content normal.  Back  is tender  Lab Results  Component Value Date   WBC 6.3 08/16/2014   HGB 12.5 08/16/2014   HCT 37.2 08/16/2014   PLT 212.0 08/16/2014   GLUCOSE 93 06/24/2015   CHOL 240* 02/16/2014   TRIG 90.0 02/16/2014   HDL 50.60 02/16/2014   LDLDIRECT 196.7 12/22/2010   LDLCALC 171* 02/16/2014   ALT 11 02/16/2014   AST 14 02/16/2014   NA 141  06/24/2015   K 3.9 06/24/2015   CL 104 06/24/2015   CREATININE 0.96 06/24/2015   BUN 10 06/24/2015   CO2 31 06/24/2015   TSH 1.65 02/16/2014   INR 0.96 03/24/2010   HGBA1C 5.7 04/26/2010    Mm Digital Screening Bilateral  09/05/2015  CLINICAL DATA:  Screening. EXAM: DIGITAL SCREENING BILATERAL MAMMOGRAM WITH CAD COMPARISON:  Previous exam(s). ACR Breast Density Category c: The breast tissue is heterogeneously dense, which may obscure small masses. FINDINGS: There are no findings suspicious for malignancy. Images were processed with CAD. IMPRESSION: No mammographic evidence of malignancy. A result letter of this screening mammogram will be mailed directly to the patient. RECOMMENDATION: Screening mammogram in one year. (Code:SM-B-01Y) BI-RADS CATEGORY  1: Negative. Electronically Signed   By: Altamese Cabal M.D.   On: 09/05/2015 07:53    Assessment & Plan:   Diagnoses and all orders for this visit:  Chronic midline low back pain with sciatica, sciatica laterality unspecified  Gastroesophageal reflux disease with esophagitis  Osteoporosis  Vitamin D deficiency  Milk intolerance (HCC)  Dyslipidemia  Other orders -     calcium-vitamin D (OSCAL WITH D) 500-200 MG-UNIT tablet; Take 1 tablet by mouth 2 (two) times daily.   I have discontinued Joyce Riley's ranitidine. I am also having her start on calcium-vitamin D. Additionally, I am having her maintain her Cholecalciferol, cyanocobalamin, zoledronic acid, loratadine, triamcinolone ointment, Dexlansoprazole, and meclizine.  Meds ordered this encounter  Medications  . calcium-vitamin D (OSCAL WITH D) 500-200 MG-UNIT tablet    Sig: Take 1 tablet by mouth 2 (two) times daily.    Dispense:  100 tablet    Refill:  3     Follow-up: Return in about 4 months (around 02/21/2016) for a follow-up visit.  Walker Kehr, MD

## 2015-10-24 NOTE — Progress Notes (Signed)
Pre visit review using our clinic review tool, if applicable. No additional management support is needed unless otherwise documented below in the visit note. 

## 2015-11-09 DIAGNOSIS — H5340 Unspecified visual field defects: Secondary | ICD-10-CM | POA: Diagnosis not present

## 2015-11-09 DIAGNOSIS — H524 Presbyopia: Secondary | ICD-10-CM | POA: Diagnosis not present

## 2015-11-09 DIAGNOSIS — H5702 Anisocoria: Secondary | ICD-10-CM | POA: Diagnosis not present

## 2015-11-09 DIAGNOSIS — H40023 Open angle with borderline findings, high risk, bilateral: Secondary | ICD-10-CM | POA: Diagnosis not present

## 2015-11-14 ENCOUNTER — Telehealth: Payer: Self-pay | Admitting: Gynecology

## 2015-11-14 ENCOUNTER — Ambulatory Visit (INDEPENDENT_AMBULATORY_CARE_PROVIDER_SITE_OTHER): Payer: Medicare Other | Admitting: Gynecology

## 2015-11-14 ENCOUNTER — Encounter: Payer: Self-pay | Admitting: Gynecology

## 2015-11-14 VITALS — BP 132/80 | Ht <= 58 in | Wt 131.0 lb

## 2015-11-14 DIAGNOSIS — M810 Age-related osteoporosis without current pathological fracture: Secondary | ICD-10-CM

## 2015-11-14 DIAGNOSIS — Z01419 Encounter for gynecological examination (general) (routine) without abnormal findings: Secondary | ICD-10-CM

## 2015-11-14 DIAGNOSIS — Z8639 Personal history of other endocrine, nutritional and metabolic disease: Secondary | ICD-10-CM

## 2015-11-14 LAB — CREATININE, SERUM: CREATININE: 0.98 mg/dL — AB (ref 0.60–0.93)

## 2015-11-14 LAB — BUN: BUN: 15 mg/dL (ref 7–25)

## 2015-11-14 NOTE — Progress Notes (Signed)
Kerstin Haseley 06-14-1934 [id number]   History:    82 y.o.  for annual gyn exam  With history of osteoporosis. This June she will receive her seventh dose of Reclast. Her last bone density study in 2016 demonstrated her lowest T score was at the distal one third radius of the left arm with a T score -3.0. Right and left femoral neck with no statistically significant change. Scoliosis was evident. Patient reports having had a bone density study in 2016 and benign polyps were removed. Patient also has had history vitamin D deficiency in the past. She is currently taking calcium and vitamin D daily.Dr. Alain Marion is her primary physician who has been treating her for hyperlipidemia as well as vitamin D deficiency. Patient denies any past history of Pap smears in the past when she was living in [place].   Past medical history,surgical history, family history and social history were all reviewed and documented in the EPIC chart.  Gynecologic History No LMP recorded. Patient is postmenopausal. Contraception: post menopausal status Last Pap: 2012 . Results were: normal Last mammogram: 2016 . Results were: normal  Obstetric History OB History  Gravida Para Term Preterm AB SAB TAB Ectopic Multiple Living  4 1 1  3 3    1     # Outcome Date GA Lbr Len/2nd Weight Sex Delivery Anes PTL Lv  4 SAB           3 SAB           2 SAB           1 Term     F Vag-Spont  N Y       ROS: A ROS was performed and pertinent positives and negatives are included in the history.  GENERAL: No fevers or chills. HEENT: No change in vision, no earache, sore throat or sinus congestion. NECK: No pain or stiffness. CARDIOVASCULAR: No chest pain or pressure. No palpitations. PULMONARY: No shortness of breath, cough or wheeze. GASTROINTESTINAL: No abdominal pain, nausea, vomiting or diarrhea, melena or bright red blood per rectum. GENITOURINARY: No urinary frequency, urgency, hesitancy or dysuria. MUSCULOSKELETAL: No joint or  muscle pain, no back pain, no recent trauma. DERMATOLOGIC: No rash, no itching, no lesions. ENDOCRINE: No polyuria, polydipsia, no heat or cold intolerance. No recent change in weight. HEMATOLOGICAL: No anemia or easy bruising or bleeding. NEUROLOGIC: No headache, seizures, numbness, tingling or weakness. PSYCHIATRIC: No depression, no loss of interest in normal activity or change in sleep pattern.     Exam: chaperone present  BP 132/80 mmHg  Ht 4\' 7"  (1.397 m)  Wt 131 lb (59.421 kg)  BMI 30.45 kg/m2  Body mass index is 30.45 kg/(m^2).  General appearance : Well developed well nourished female. No acute distress HEENT: Eyes: no retinal hemorrhage or exudates,  Neck supple, trachea midline, no carotid bruits, no thyroidmegaly Lungs: Clear to auscultation, no rhonchi or wheezes, or rib retractions  Heart: Regular rate and rhythm, no murmurs or gallops Breast:Examined in sitting and supine position were symmetrical in appearance, no palpable masses or tenderness,  no skin retraction, no nipple inversion, no nipple discharge, no skin discoloration, no axillary or supraclavicular lymphadenopathy Abdomen: no palpable masses or tenderness, no rebound or guarding Extremities: no edema or skin discoloration or tenderness  Pelvic:  Bartholin, Urethra, Skene Glands: Within normal limits             Vagina: No gross lesions or discharge, Atrophic changes   Cervix: No gross  lesions or discharge  Uterus  anteverted , normal size, shape and consistency, non-tender and mobile  Adnexa  Without masses or tenderness  Anus and perineum  normal   Rectovaginal  normal sphincter tone without palpated masses or tenderness             Hemoccult colonoscopy benign polyps removed less than 12 months ago    Assessment/Plan:  82 y.o. female for annual exam we'll have her seventh Reclast infusion in June. Because of her history of vitamin D deficiency a vitamin D level along with calcium and PTH will be drawn  today as well as her history of osteoporosis. We'll check her BUN/creatinine as well. Pap smear no longer indicated according to the new guidelines. Patient to continue to take her calcium and vitamin D daily. Patient to schedule her mammogram. Her PCP will be doing the rest of her blood work. We discussed that after this infusion of Reclast  If next year her bone density study continues to demonstrate stability we may go on a drug holiday. [place] interpreter present.    Terrance Mass MD, 11:42 AM 11/14/2015

## 2015-11-14 NOTE — Telephone Encounter (Signed)
PC to pt daughter , Reclast due after June 22. Will call pt and set up end of May. Dr Toney Rakes asked me to call and confirm the date for Reclast.

## 2015-11-15 LAB — PTH, INTACT AND CALCIUM
CALCIUM: 8.6 mg/dL (ref 8.4–10.5)
PTH: 67 pg/mL — AB (ref 14–64)

## 2015-11-15 LAB — VITAMIN D 25 HYDROXY (VIT D DEFICIENCY, FRACTURES): Vit D, 25-Hydroxy: 42 ng/mL (ref 30–100)

## 2015-11-17 ENCOUNTER — Other Ambulatory Visit: Payer: Self-pay | Admitting: Gynecology

## 2015-11-17 DIAGNOSIS — E213 Hyperparathyroidism, unspecified: Secondary | ICD-10-CM

## 2015-11-17 DIAGNOSIS — M810 Age-related osteoporosis without current pathological fracture: Secondary | ICD-10-CM

## 2016-02-21 ENCOUNTER — Telehealth: Payer: Self-pay | Admitting: Gynecology

## 2016-02-21 ENCOUNTER — Other Ambulatory Visit (INDEPENDENT_AMBULATORY_CARE_PROVIDER_SITE_OTHER): Payer: Medicare Other

## 2016-02-21 ENCOUNTER — Ambulatory Visit (INDEPENDENT_AMBULATORY_CARE_PROVIDER_SITE_OTHER): Payer: Medicare Other | Admitting: Internal Medicine

## 2016-02-21 ENCOUNTER — Encounter: Payer: Self-pay | Admitting: Internal Medicine

## 2016-02-21 VITALS — BP 130/90 | HR 73 | Wt 129.0 lb

## 2016-02-21 DIAGNOSIS — R197 Diarrhea, unspecified: Secondary | ICD-10-CM | POA: Diagnosis not present

## 2016-02-21 DIAGNOSIS — M810 Age-related osteoporosis without current pathological fracture: Secondary | ICD-10-CM

## 2016-02-21 DIAGNOSIS — N3946 Mixed incontinence: Secondary | ICD-10-CM

## 2016-02-21 DIAGNOSIS — K2100 Gastro-esophageal reflux disease with esophagitis, without bleeding: Secondary | ICD-10-CM

## 2016-02-21 DIAGNOSIS — R32 Unspecified urinary incontinence: Secondary | ICD-10-CM | POA: Insufficient documentation

## 2016-02-21 LAB — URINALYSIS, ROUTINE W REFLEX MICROSCOPIC
BILIRUBIN URINE: NEGATIVE
Ketones, ur: NEGATIVE
Leukocytes, UA: NEGATIVE
NITRITE: NEGATIVE
PH: 6.5 (ref 5.0–8.0)
SPECIFIC GRAVITY, URINE: 1.015 (ref 1.000–1.030)
Urine Glucose: NEGATIVE
Urobilinogen, UA: 0.2 (ref 0.0–1.0)

## 2016-02-21 MED ORDER — LOPERAMIDE HCL 2 MG PO TABS: 2.0000 mg | ORAL_TABLET | Freq: Four times a day (QID) | ORAL | Status: AC | PRN

## 2016-02-21 MED ORDER — DEXLANSOPRAZOLE 30 MG PO CPDR: 30.0000 mg | DELAYED_RELEASE_CAPSULE | Freq: Every day | ORAL | Status: DC

## 2016-02-21 MED ORDER — OXYBUTYNIN CHLORIDE 5 MG PO TABS: 5.0000 mg | ORAL_TABLET | Freq: Three times a day (TID) | ORAL | Status: DC | PRN

## 2016-02-21 NOTE — Assessment & Plan Note (Signed)
HH Dexilant - better  Dr Collene Mares

## 2016-02-21 NOTE — Assessment & Plan Note (Signed)
Dr Collene Mares Imodium prn

## 2016-02-21 NOTE — Progress Notes (Signed)
Pre visit review using our clinic review tool, if applicable. No additional management support is needed unless otherwise documented below in the visit note. 

## 2016-02-21 NOTE — Progress Notes (Signed)
Subjective:  Patient ID: Joyce Riley, female    DOB: 1933/10/28  Age: 82 y.o. MRN: [id number]  CC: No chief complaint on file.   HPI Vallen Teer presents for diarrhea, urinary frequency, allergies, osteoporosis f/u  Outpatient Prescriptions Prior to Visit  Medication Sig Dispense Refill  . calcium-vitamin D (OSCAL WITH D) 500-200 MG-UNIT tablet Take 1 tablet by mouth 2 (two) times daily. 100 tablet 3  . Cholecalciferol 1000 UNITS tablet Take 1 tablet (1,000 Units total) by mouth daily. 100 tablet 3  . cyanocobalamin 500 MCG tablet Take 1 tablet (500 mcg total) by mouth daily. 100 tablet 3  . Dexlansoprazole (DEXILANT) 30 MG capsule Take 1 capsule (30 mg total) by mouth daily. 90 capsule 3  . loratadine (CLARITIN) 10 MG tablet Take 1 tablet (10 mg total) by mouth daily as needed for allergies. 30 tablet 3  . meclizine (ANTIVERT) 12.5 MG tablet Take 1 tablet (12.5 mg total) by mouth 3 (three) times daily as needed for dizziness or nausea. 60 tablet 2  . triamcinolone ointment (KENALOG) 0.5 % Apply 1 application topically 2 (two) times daily. 60 g 3  . zoledronic acid (RECLAST) 5 MG/100ML SOLN Inject 100 mLs (5 mg total) into the vein once. q 12 mo started in 2012 per gyn 100 mL 0   No facility-administered medications prior to visit.    ROS Review of Systems  Constitutional: Negative for chills, activity change, appetite change, fatigue and unexpected weight change.  HENT: Negative for congestion, dental problem, mouth sores and sinus pressure.   Eyes: Negative for visual disturbance.  Respiratory: Negative for cough and chest tightness.   Gastrointestinal: Positive for diarrhea. Negative for nausea, vomiting and abdominal pain.  Genitourinary: Positive for urgency and frequency. Negative for difficulty urinating and vaginal pain.  Musculoskeletal: Negative for back pain and gait problem.  Skin: Negative for pallor and rash.  Neurological: Negative for dizziness, tremors,  weakness, numbness and headaches.  Psychiatric/Behavioral: Negative for suicidal ideas, confusion and sleep disturbance. The patient is nervous/anxious.     Objective:  BP 130/90 mmHg  Pulse 73  Wt 129 lb (58.514 kg)  SpO2 96%  BP Readings from Last 3 Encounters:  02/21/16 130/90  11/14/15 132/80  10/24/15 140/88    Wt Readings from Last 3 Encounters:  02/21/16 129 lb (58.514 kg)  11/14/15 131 lb (59.421 kg)  10/24/15 129 lb (58.514 kg)    Physical Exam  Constitutional: She appears well-developed. No distress.  HENT:  Head: Normocephalic.  Right Ear: External ear normal.  Left Ear: External ear normal.  Nose: Nose normal.  Mouth/Throat: Oropharynx is clear and moist.  Eyes: Conjunctivae are normal. Pupils are equal, round, and reactive to light. Right eye exhibits no discharge. Left eye exhibits no discharge.  Neck: Normal range of motion. Neck supple. No JVD present. No tracheal deviation present. No thyromegaly present.  Cardiovascular: Normal rate, regular rhythm and normal heart sounds.   Pulmonary/Chest: No stridor. No respiratory distress. She has no wheezes.  Abdominal: Soft. Bowel sounds are normal. She exhibits no distension and no mass. There is no tenderness. There is no rebound and no guarding.  Musculoskeletal: She exhibits tenderness. She exhibits no edema.  Lymphadenopathy:    She has no cervical adenopathy.  Neurological: She displays normal reflexes. No cranial nerve deficit. She exhibits normal muscle tone. Coordination normal.  Skin: No rash noted. No erythema.  Psychiatric: She has a normal mood and affect. Her behavior is normal. Judgment and thought  content normal.    Lab Results  Component Value Date   WBC 6.3 08/16/2014   HGB 12.5 08/16/2014   HCT 37.2 08/16/2014   PLT 212.0 08/16/2014   GLUCOSE 93 06/24/2015   CHOL 240* 02/16/2014   TRIG 90.0 02/16/2014   HDL 50.60 02/16/2014   LDLDIRECT 196.7 12/22/2010   LDLCALC 171* 02/16/2014   ALT  11 02/16/2014   AST 14 02/16/2014   NA 141 06/24/2015   K 3.9 06/24/2015   CL 104 06/24/2015   CREATININE 0.98* 11/14/2015   BUN 15 11/14/2015   CO2 31 06/24/2015   TSH 1.65 02/16/2014   INR 0.96 03/24/2010   HGBA1C 5.7 04/26/2010    Mm Digital Screening Bilateral  09/05/2015  CLINICAL DATA:  Screening. EXAM: DIGITAL SCREENING BILATERAL MAMMOGRAM WITH CAD COMPARISON:  Previous exam(s). ACR Breast Density Category c: The breast tissue is heterogeneously dense, which may obscure small masses. FINDINGS: There are no findings suspicious for malignancy. Images were processed with CAD. IMPRESSION: No mammographic evidence of malignancy. A result letter of this screening mammogram will be mailed directly to the patient. RECOMMENDATION: Screening mammogram in one year. (Code:SM-B-01Y) BI-RADS CATEGORY  1: Negative. Electronically Signed   By: Altamese Cabal M.D.   On: 09/05/2015 07:53    Assessment & Plan:   There are no diagnoses linked to this encounter. I am having Ms. Figueira maintain her Cholecalciferol, cyanocobalamin, zoledronic acid, loratadine, triamcinolone ointment, Dexlansoprazole, meclizine, and calcium-vitamin D.  No orders of the defined types were placed in this encounter.     Follow-up: No Follow-up on file.  Walker Kehr, MD

## 2016-02-21 NOTE — Telephone Encounter (Signed)
Reclast infusion due after 03/16/16 per note from Dr Toney Rakes. Patient will need labs prior to infusion. Orders for lab work in [employer] and daughter of pt to be called. Pt speaks [place] only.

## 2016-02-21 NOTE — Assessment & Plan Note (Signed)
UA OAB Ditropan prn

## 2016-02-28 NOTE — Telephone Encounter (Signed)
PC to daughter [name], PCP no recent blood work. Will come in 03/08/16 for BUN, Calcium, and Creatinine. Will schedule Reclast after blood work results.

## 2016-03-01 ENCOUNTER — Telehealth: Payer: Self-pay | Admitting: *Deleted

## 2016-03-01 NOTE — Telephone Encounter (Signed)
A user error has taken place: open by mistake should be husband chart...Gust Rung

## 2016-03-08 ENCOUNTER — Other Ambulatory Visit: Payer: Medicare Other

## 2016-03-08 DIAGNOSIS — M810 Age-related osteoporosis without current pathological fracture: Secondary | ICD-10-CM

## 2016-03-09 LAB — CREATININE, SERUM: CREATININE: 0.92 mg/dL (ref 0.60–0.93)

## 2016-03-09 LAB — CALCIUM: Calcium: 8.6 mg/dL (ref 8.6–10.4)

## 2016-03-09 LAB — BUN: BUN: 11 mg/dL (ref 7–25)

## 2016-03-13 NOTE — Telephone Encounter (Signed)
Results for Joyce Riley, Joyce Riley (MRN [id number]) as of 03/13/2016 14:36  Ref. Range 03/08/2016 11:17  BUN Latest Ref Range: 7-25 mg/dL 11  Creatinine Latest Ref Range: 0.60-0.93 mg/dL 0.92  Calcium Latest Ref Range: 8.6-10.4 mg/dL 8.6  Phone call to daughter [name] to schedule Reclast per Dr Toney Rakes.  Labs are normal. She would like the week of 03/19/16 I will schedule and let her know. Mother will need translator [place])

## 2016-03-15 NOTE — Telephone Encounter (Signed)
reclast scheduled for 03/21/16 at 12 noon at [facility] infusion center. Called [name] (daughter) and informed her of time and instructions.

## 2016-03-20 ENCOUNTER — Other Ambulatory Visit (HOSPITAL_COMMUNITY): Payer: Self-pay

## 2016-03-21 ENCOUNTER — Ambulatory Visit (HOSPITAL_COMMUNITY)
Admission: RE | Admit: 2016-03-21 | Discharge: 2016-03-21 | Disposition: A | Discharge status: Home / Self Care | Payer: Medicare Other | Source: Ambulatory Visit | Attending: Gynecology | Admitting: Gynecology

## 2016-03-21 DIAGNOSIS — M810 Age-related osteoporosis without current pathological fracture: Secondary | ICD-10-CM | POA: Diagnosis not present

## 2016-03-21 MED ORDER — ZOLEDRONIC ACID 5 MG/100ML IV SOLN
INTRAVENOUS | Status: AC
  Filled 2016-03-21: qty 100

## 2016-03-21 MED ORDER — ZOLEDRONIC ACID 5 MG/100ML IV SOLN
5.0000 mg | Freq: Once | INTRAVENOUS | Status: AC
  Administered 2016-03-21: 5 mg via INTRAVENOUS

## 2016-03-29 NOTE — Telephone Encounter (Signed)
Reclast given 03/21/16  Next infusion per MD order.

## 2016-05-30 DIAGNOSIS — H35373 Puckering of macula, bilateral: Secondary | ICD-10-CM | POA: Diagnosis not present

## 2016-05-30 DIAGNOSIS — Z961 Presence of intraocular lens: Secondary | ICD-10-CM | POA: Diagnosis not present

## 2016-05-30 DIAGNOSIS — H40023 Open angle with borderline findings, high risk, bilateral: Secondary | ICD-10-CM | POA: Diagnosis not present

## 2016-05-30 DIAGNOSIS — H35363 Drusen (degenerative) of macula, bilateral: Secondary | ICD-10-CM | POA: Diagnosis not present

## 2016-06-22 ENCOUNTER — Ambulatory Visit: Payer: Medicare Other | Admitting: Internal Medicine

## 2016-06-29 ENCOUNTER — Encounter: Payer: Self-pay | Admitting: Internal Medicine

## 2016-06-29 ENCOUNTER — Ambulatory Visit (INDEPENDENT_AMBULATORY_CARE_PROVIDER_SITE_OTHER): Payer: Medicare Other | Admitting: Internal Medicine

## 2016-06-29 DIAGNOSIS — Z23 Encounter for immunization: Secondary | ICD-10-CM

## 2016-06-29 DIAGNOSIS — M5440 Lumbago with sciatica, unspecified side: Secondary | ICD-10-CM

## 2016-06-29 DIAGNOSIS — K219 Gastro-esophageal reflux disease without esophagitis: Secondary | ICD-10-CM

## 2016-06-29 DIAGNOSIS — G8929 Other chronic pain: Secondary | ICD-10-CM

## 2016-06-29 DIAGNOSIS — E538 Deficiency of other specified B group vitamins: Secondary | ICD-10-CM | POA: Diagnosis not present

## 2016-06-29 DIAGNOSIS — R059 Cough, unspecified: Secondary | ICD-10-CM

## 2016-06-29 DIAGNOSIS — R0789 Other chest pain: Secondary | ICD-10-CM

## 2016-06-29 DIAGNOSIS — R05 Cough: Secondary | ICD-10-CM

## 2016-06-29 MED ORDER — DEXLANSOPRAZOLE 30 MG PO CPDR: 30.0000 mg | DELAYED_RELEASE_CAPSULE | Freq: Every day | ORAL | 3 refills | Status: DC

## 2016-06-29 NOTE — Progress Notes (Signed)
Pre visit review using our clinic review tool, if applicable. No additional management support is needed unless otherwise documented below in the visit note. 

## 2016-06-29 NOTE — Assessment & Plan Note (Signed)
due to GERD/HH EKG nl Treat GERD _ PPI

## 2016-06-29 NOTE — Assessment & Plan Note (Signed)
Dexilant - risks discussed

## 2016-06-29 NOTE — Progress Notes (Signed)
Subjective:  Patient ID: Joyce Riley, female    DOB: 1934/04/07  Age: 82 y.o. MRN: [id number]  CC: No chief complaint on file.   HPI Chaslyn Ulatowski presents for GERD, LBP, diarrhea f/u. C/o CP after meals x2 - no exertional sx's.  Outpatient Medications Prior to Visit  Medication Sig Dispense Refill  . calcium-vitamin D (OSCAL WITH D) 500-200 MG-UNIT tablet Take 1 tablet by mouth 2 (two) times daily. 100 tablet 3  . Cholecalciferol 1000 UNITS tablet Take 1 tablet (1,000 Units total) by mouth daily. 100 tablet 3  . cyanocobalamin 500 MCG tablet Take 1 tablet (500 mcg total) by mouth daily. 100 tablet 3  . Dexlansoprazole (DEXILANT) 30 MG capsule Take 1 capsule (30 mg total) by mouth daily. 90 capsule 3  . loperamide (IMODIUM A-D) 2 MG tablet Take 1-2 tablets (2-4 mg total) by mouth 4 (four) times daily as needed for diarrhea or loose stools. 60 tablet 1  . loratadine (CLARITIN) 10 MG tablet Take 1 tablet (10 mg total) by mouth daily as needed for allergies. 30 tablet 3  . meclizine (ANTIVERT) 12.5 MG tablet Take 1 tablet (12.5 mg total) by mouth 3 (three) times daily as needed for dizziness or nausea. 60 tablet 2  . oxybutynin (DITROPAN) 5 MG tablet Take 1 tablet (5 mg total) by mouth every 8 (eight) hours as needed for bladder spasms (incontinence). 90 tablet 3  . triamcinolone ointment (KENALOG) 0.5 % Apply 1 application topically 2 (two) times daily. 60 g 3  . zoledronic acid (RECLAST) 5 MG/100ML SOLN Inject 100 mLs (5 mg total) into the vein once. q 12 mo started in 2012 per gyn 100 mL 0   No facility-administered medications prior to visit.     ROS Review of Systems  Constitutional: Negative for activity change, appetite change, chills, fatigue and unexpected weight change.  HENT: Negative for congestion, mouth sores and sinus pressure.   Eyes: Negative for visual disturbance.  Respiratory: Negative for cough and chest tightness.   Gastrointestinal: Negative for abdominal  pain and nausea.  Genitourinary: Negative for difficulty urinating, frequency and vaginal pain.  Musculoskeletal: Negative for back pain and gait problem.  Skin: Negative for pallor and rash.  Neurological: Negative for dizziness, tremors, weakness, numbness and headaches.  Psychiatric/Behavioral: Negative for confusion and sleep disturbance. The patient is nervous/anxious.     Objective:  BP 140/76   Pulse 67   Temp 98.6 F (37 C) (Oral)   Wt 132 lb (59.9 kg)   SpO2 97%   BMI 30.68 kg/m   BP Readings from Last 3 Encounters:  06/29/16 140/76  03/21/16 (!) 144/83  02/21/16 130/90    Wt Readings from Last 3 Encounters:  06/29/16 132 lb (59.9 kg)  02/21/16 129 lb (58.5 kg)  11/14/15 131 lb (59.4 kg)    Physical Exam  Constitutional: She appears well-developed. No distress.  HENT:  Head: Normocephalic.  Right Ear: External ear normal.  Left Ear: External ear normal.  Nose: Nose normal.  Mouth/Throat: Oropharynx is clear and moist.  Eyes: Conjunctivae are normal. Pupils are equal, round, and reactive to light. Right eye exhibits no discharge. Left eye exhibits no discharge.  Neck: Normal range of motion. Neck supple. No JVD present. No tracheal deviation present. No thyromegaly present.  Cardiovascular: Normal rate, regular rhythm and normal heart sounds.   Pulmonary/Chest: No stridor. No respiratory distress. She has no wheezes.  Abdominal: Soft. Bowel sounds are normal. She exhibits no distension and no  mass. There is no tenderness. There is no rebound and no guarding.  Musculoskeletal: She exhibits tenderness. She exhibits no edema.  Lymphadenopathy:    She has no cervical adenopathy.  Neurological: She displays normal reflexes. No cranial nerve deficit. She exhibits normal muscle tone. Coordination normal.  Skin: No rash noted. No erythema.  Psychiatric: She has a normal mood and affect. Her behavior is normal. Judgment and thought content normal.   Procedure:  EKG Indication: chest pain Impression: NSR. No acute changes.   Lab Results  Component Value Date   WBC 6.3 08/16/2014   HGB 12.5 08/16/2014   HCT 37.2 08/16/2014   PLT 212.0 08/16/2014   GLUCOSE 93 06/24/2015   CHOL 240 (H) 02/16/2014   TRIG 90.0 02/16/2014   HDL 50.60 02/16/2014   LDLDIRECT 196.7 12/22/2010   LDLCALC 171 (H) 02/16/2014   ALT 11 02/16/2014   AST 14 02/16/2014   NA 141 06/24/2015   K 3.9 06/24/2015   CL 104 06/24/2015   CREATININE 0.92 03/08/2016   BUN 11 03/08/2016   CO2 31 06/24/2015   TSH 1.65 02/16/2014   INR 0.96 03/24/2010   HGBA1C 5.7 04/26/2010    No results found.  Assessment & Plan:   There are no diagnoses linked to this encounter. I am having Ms. Faughnan maintain her Cholecalciferol, cyanocobalamin, zoledronic acid, loratadine, triamcinolone ointment, meclizine, calcium-vitamin D, Dexlansoprazole, loperamide, and oxybutynin.  No orders of the defined types were placed in this encounter.    Follow-up: No Follow-up on file.  Walker Kehr, MD

## 2016-06-29 NOTE — Assessment & Plan Note (Signed)
On B12 

## 2016-06-29 NOTE — Assessment & Plan Note (Signed)
Chronic - due to scoliosis

## 2016-06-29 NOTE — Assessment & Plan Note (Signed)
Resolved on PPI 

## 2016-08-08 ENCOUNTER — Other Ambulatory Visit: Payer: Self-pay | Admitting: Gynecology

## 2016-08-08 DIAGNOSIS — Z1231 Encounter for screening mammogram for malignant neoplasm of breast: Secondary | ICD-10-CM

## 2016-09-06 ENCOUNTER — Ambulatory Visit
Admission: RE | Admit: 2016-09-06 | Discharge: 2016-09-06 | Disposition: A | Discharge status: Home / Self Care | Payer: Medicare Other | Source: Ambulatory Visit | Attending: Gynecology | Admitting: Gynecology

## 2016-09-06 DIAGNOSIS — Z1231 Encounter for screening mammogram for malignant neoplasm of breast: Secondary | ICD-10-CM

## 2016-09-06 IMAGING — MG 2D DIGITAL SCREENING BILATERAL MAMMOGRAM WITH CAD AND ADJUNCT TO
9 of 12 series · 9 of 28 positions shown · non-contrast
Comparison: Previous exam(s).

CLINICAL DATA: Screening.

EXAM:
2D DIGITAL SCREENING BILATERAL MAMMOGRAM WITH CAD AND ADJUNCT TOMO

[R CC]
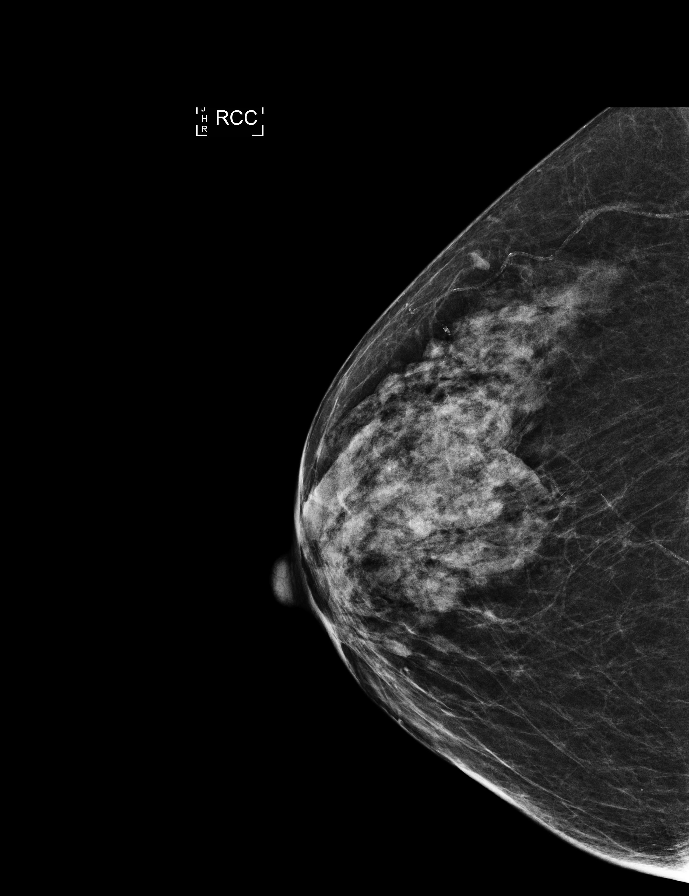

[L CC synth-2D]
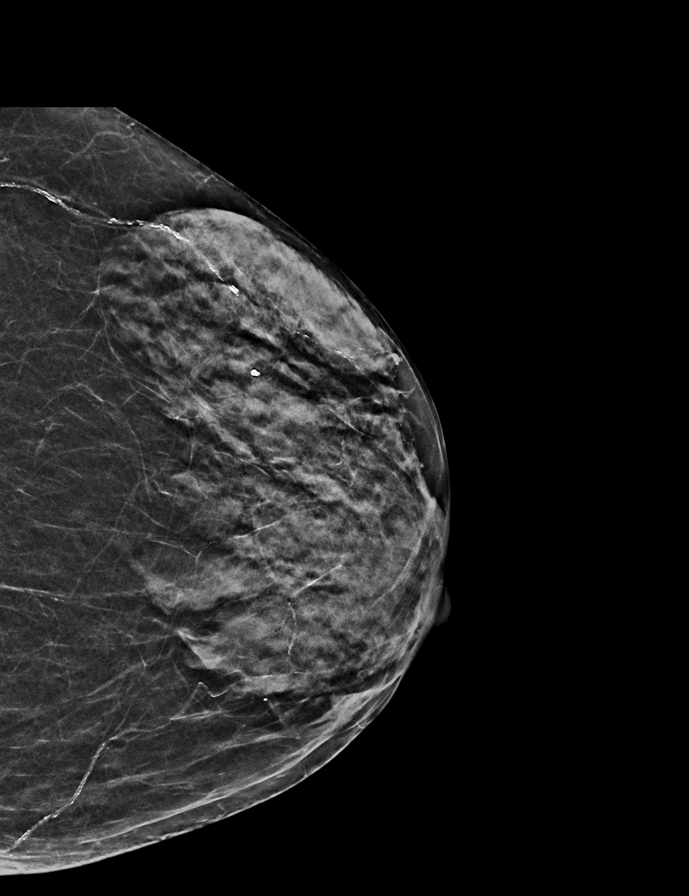

[R MLO synth-2D]
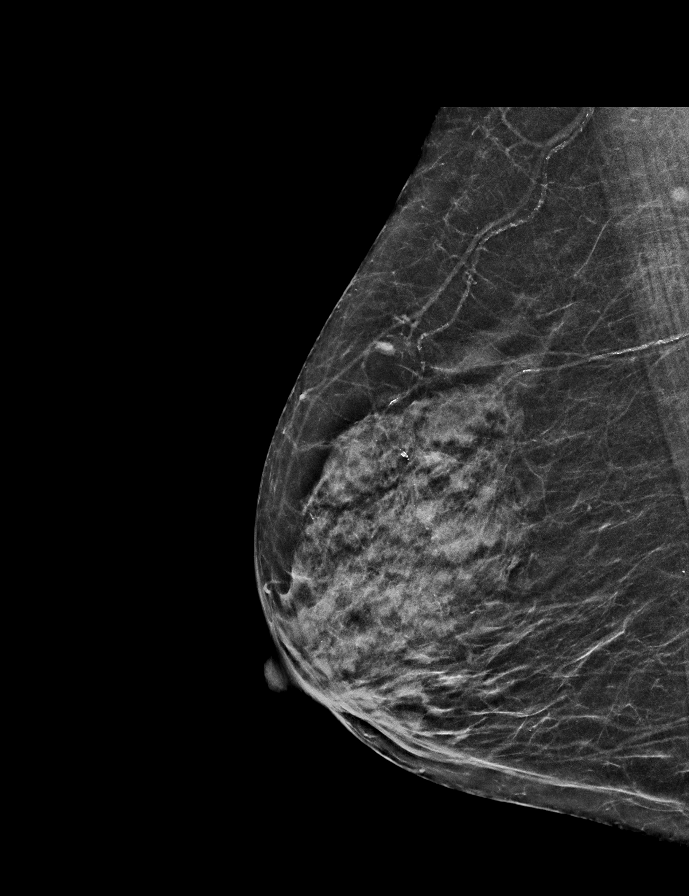

[R MLO]
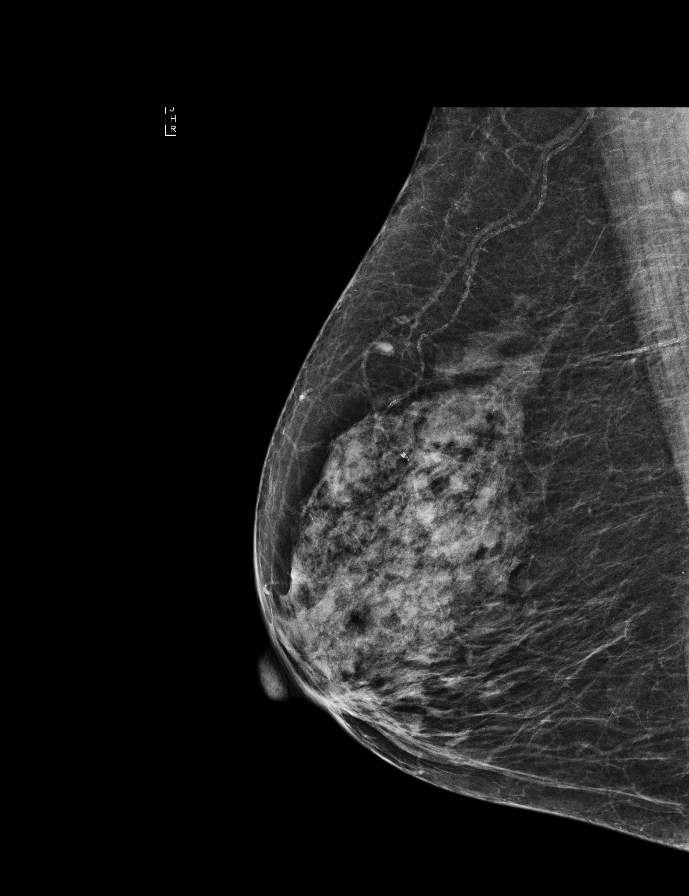

[L MLO]
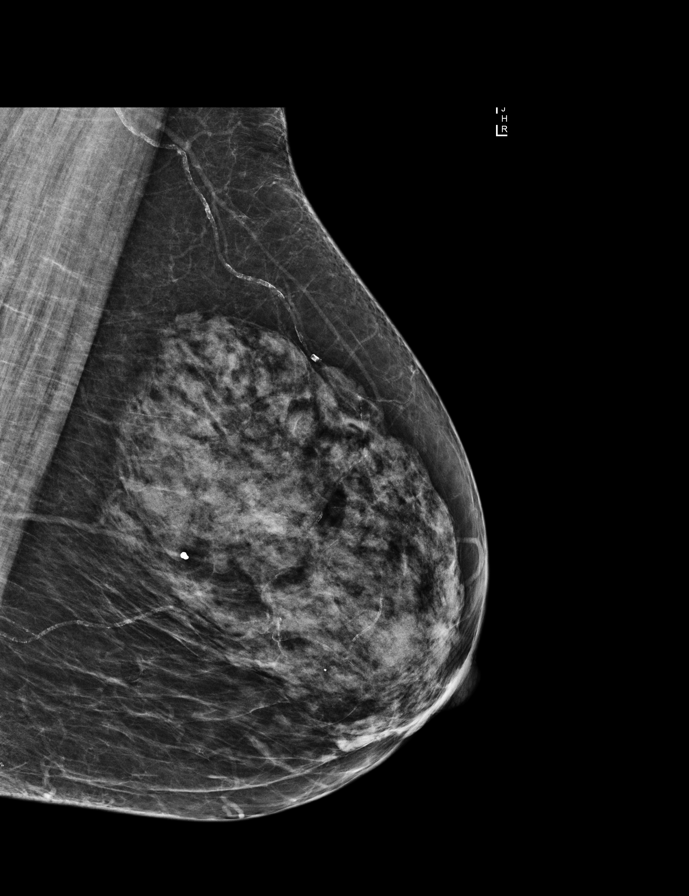

[L CC]
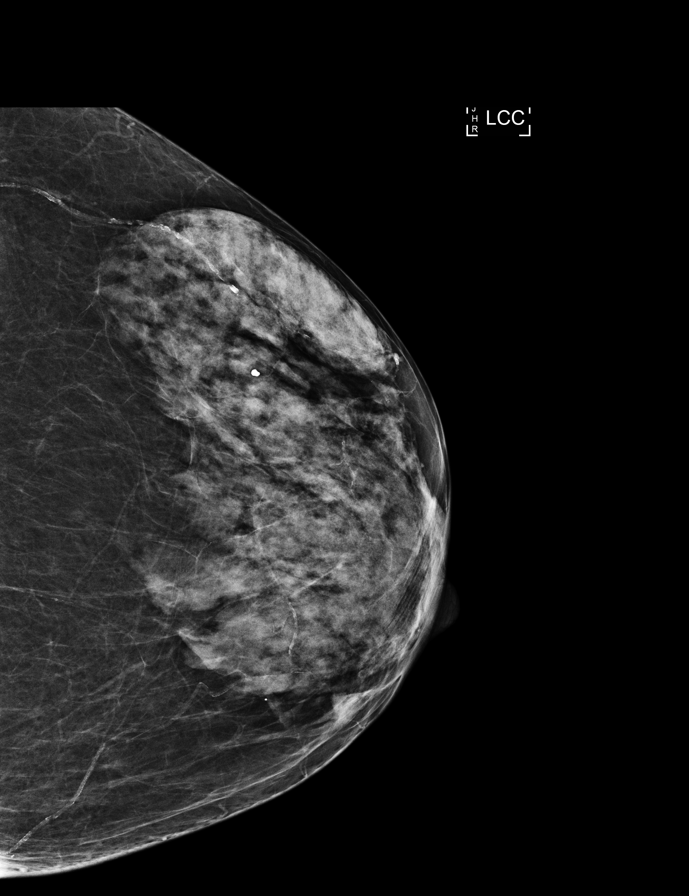

[R CC synth-2D]
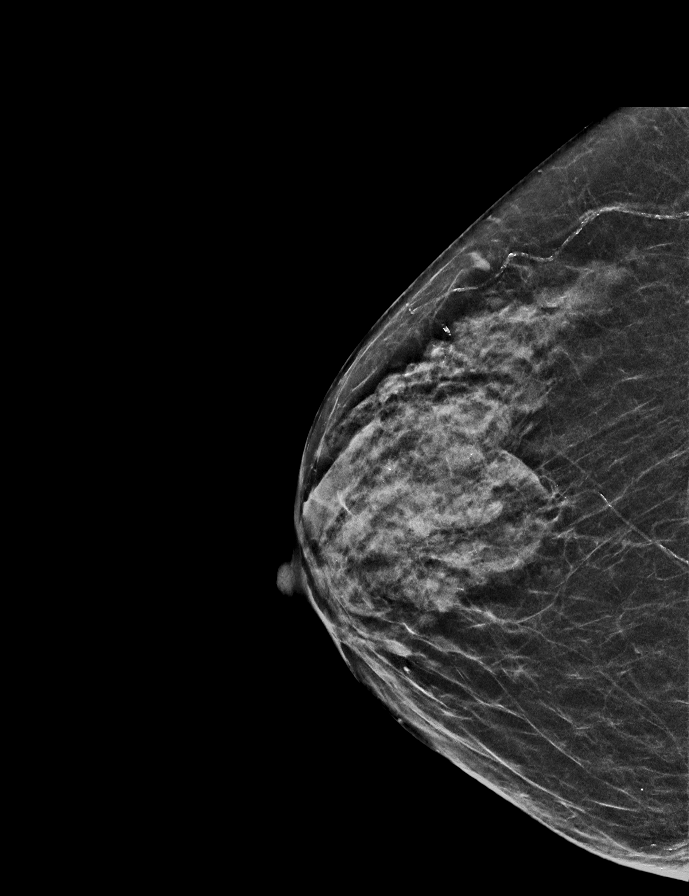

[L MLO synth-2D]
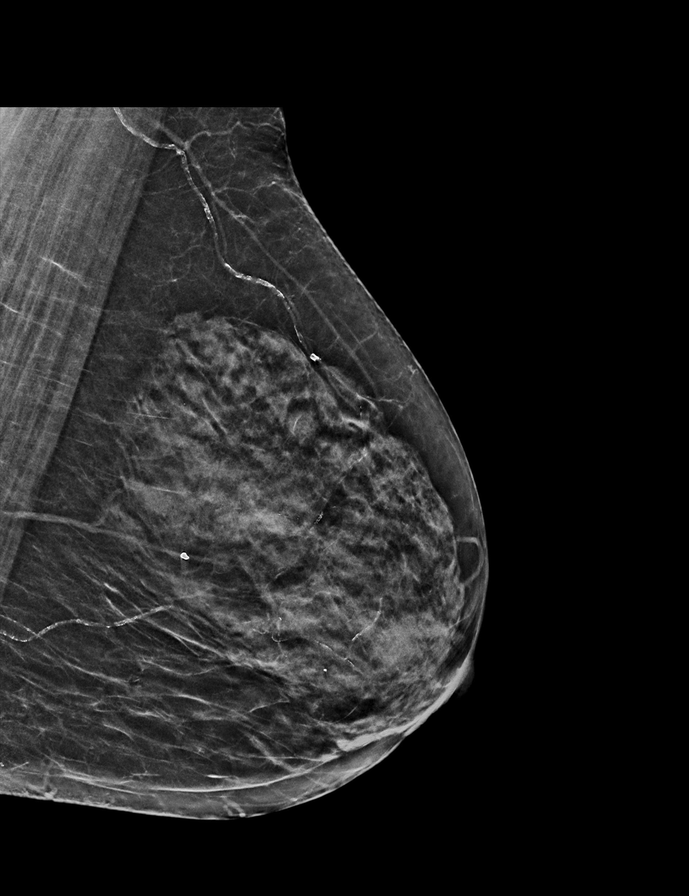

[L CC tomo · tomo slice 20/39.0]
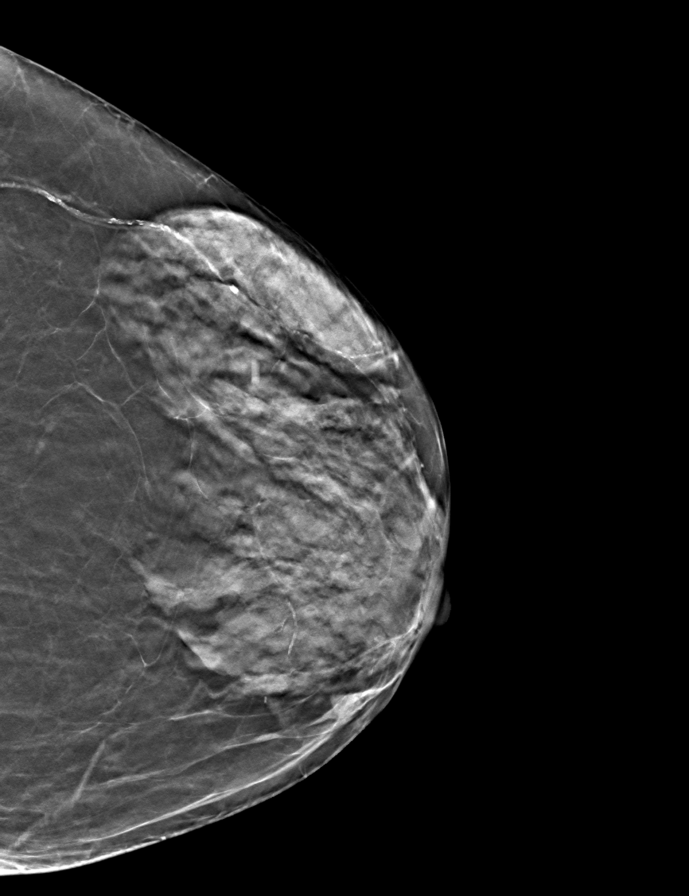

[9 of 28 positions shown; findings below may reference images not displayed]

ACR Breast Density Category c: The breast tissue is heterogeneously
dense, which may obscure small masses.
FINDINGS: There are no findings suspicious for malignancy. Images were
processed with CAD.
IMPRESSION: No mammographic evidence of malignancy. A result letter of this
screening mammogram will be mailed directly to the patient.

RECOMMENDATION:
Screening mammogram in one year. (Code:[TA])

BI-RADS CATEGORY  1: Negative.

## 2016-09-19 ENCOUNTER — Other Ambulatory Visit: Payer: Self-pay | Admitting: Internal Medicine

## 2016-10-31 DIAGNOSIS — H903 Sensorineural hearing loss, bilateral: Secondary | ICD-10-CM | POA: Diagnosis not present

## 2016-11-02 ENCOUNTER — Encounter: Payer: Self-pay | Admitting: Internal Medicine

## 2016-11-02 ENCOUNTER — Ambulatory Visit (INDEPENDENT_AMBULATORY_CARE_PROVIDER_SITE_OTHER): Payer: Medicare Other | Admitting: Internal Medicine

## 2016-11-02 VITALS — BP 138/80 | HR 80 | Temp 98.1°F | Resp 20 | Wt 127.8 lb

## 2016-11-02 DIAGNOSIS — R05 Cough: Secondary | ICD-10-CM

## 2016-11-02 DIAGNOSIS — Z23 Encounter for immunization: Secondary | ICD-10-CM | POA: Diagnosis not present

## 2016-11-02 DIAGNOSIS — E559 Vitamin D deficiency, unspecified: Secondary | ICD-10-CM | POA: Diagnosis not present

## 2016-11-02 DIAGNOSIS — E538 Deficiency of other specified B group vitamins: Secondary | ICD-10-CM | POA: Diagnosis not present

## 2016-11-02 DIAGNOSIS — K429 Umbilical hernia without obstruction or gangrene: Secondary | ICD-10-CM

## 2016-11-02 DIAGNOSIS — K219 Gastro-esophageal reflux disease without esophagitis: Secondary | ICD-10-CM | POA: Diagnosis not present

## 2016-11-02 DIAGNOSIS — R059 Cough, unspecified: Secondary | ICD-10-CM

## 2016-11-02 DIAGNOSIS — E785 Hyperlipidemia, unspecified: Secondary | ICD-10-CM | POA: Diagnosis not present

## 2016-11-02 DIAGNOSIS — Z0000 Encounter for general adult medical examination without abnormal findings: Secondary | ICD-10-CM

## 2016-11-02 MED ORDER — CYANOCOBALAMIN 500 MCG PO TABS: 1000.0000 ug | ORAL_TABLET | Freq: Every day | ORAL | 3 refills | Status: DC

## 2016-11-02 NOTE — Assessment & Plan Note (Signed)
On Vit D 

## 2016-11-02 NOTE — Assessment & Plan Note (Signed)
No relapse on PPI

## 2016-11-02 NOTE — Assessment & Plan Note (Signed)
Dexilant  Potential benefits of a long term PPI use as well as potential risks  and complications were explained to the patient and were aknowledged.

## 2016-11-02 NOTE — Assessment & Plan Note (Signed)
On B12 

## 2016-11-02 NOTE — Addendum Note (Signed)
Addended by: Della Goo C on: 11/02/2016 11:41 AM   Modules accepted: Orders

## 2016-11-02 NOTE — Progress Notes (Signed)
Pre visit review using our clinic review tool, if applicable. No additional management support is needed unless otherwise documented below in the visit note. 

## 2016-11-02 NOTE — Assessment & Plan Note (Signed)
Small, NT - will watch

## 2016-11-02 NOTE — Progress Notes (Signed)
Subjective:  Patient ID: Joyce Riley, female    DOB: 1934/02/24  Age: 83 y.o. MRN: [id number]  CC: No chief complaint on file.   HPI Joyce Riley presents for GERD, B12 def, osteoporosis f/u C/o belly button hernia  Outpatient Medications Prior to Visit  Medication Sig Dispense Refill  . calcium-vitamin D (OSCAL WITH D) 500-200 MG-UNIT tablet Take 1 tablet by mouth 2 (two) times daily. 100 tablet 3  . Cholecalciferol 1000 UNITS tablet Take 1 tablet (1,000 Units total) by mouth daily. 100 tablet 3  . DEXILANT 30 MG capsule TAKE ONE CAPSULE BY MOUTH DAILY 90 capsule 1  . loperamide (IMODIUM A-D) 2 MG tablet Take 1-2 tablets (2-4 mg total) by mouth 4 (four) times daily as needed for diarrhea or loose stools. 60 tablet 1  . loratadine (CLARITIN) 10 MG tablet Take 1 tablet (10 mg total) by mouth daily as needed for allergies. 30 tablet 3  . meclizine (ANTIVERT) 12.5 MG tablet Take 1 tablet (12.5 mg total) by mouth 3 (three) times daily as needed for dizziness or nausea. 60 tablet 2  . oxybutynin (DITROPAN) 5 MG tablet Take 1 tablet (5 mg total) by mouth every 8 (eight) hours as needed for bladder spasms (incontinence). 90 tablet 3  . triamcinolone ointment (KENALOG) 0.5 % Apply 1 application topically 2 (two) times daily. 60 g 3  . zoledronic acid (RECLAST) 5 MG/100ML SOLN Inject 100 mLs (5 mg total) into the vein once. q 12 mo started in 2012 per gyn 100 mL 0  . cyanocobalamin 500 MCG tablet Take 1 tablet (500 mcg total) by mouth daily. 100 tablet 3  . Dexlansoprazole (DEXILANT) 30 MG capsule Take 1 capsule (30 mg total) by mouth daily. 90 capsule 3   No facility-administered medications prior to visit.     ROS Review of Systems  Constitutional: Negative for activity change, appetite change, chills, fatigue and unexpected weight change.  HENT: Negative for congestion, mouth sores and sinus pressure.   Eyes: Negative for visual disturbance.  Respiratory: Negative for cough, chest  tightness, shortness of breath and wheezing.   Gastrointestinal: Positive for diarrhea. Negative for abdominal pain, constipation and nausea.  Genitourinary: Negative for difficulty urinating, frequency and vaginal pain.  Musculoskeletal: Negative for back pain and gait problem.  Skin: Negative for pallor and rash.  Neurological: Negative for dizziness, tremors, weakness, numbness and headaches.  Psychiatric/Behavioral: Negative for confusion and sleep disturbance. The patient is not nervous/anxious.     Objective:  BP 138/80   Pulse 80   Temp 98.1 F (36.7 C) (Oral)   Resp 20   Wt 127 lb 12 oz (57.9 kg)   SpO2 90%   BMI 29.69 kg/m   BP Readings from Last 3 Encounters:  11/02/16 138/80  06/29/16 140/76  03/21/16 (!) 144/83    Wt Readings from Last 3 Encounters:  11/02/16 127 lb 12 oz (57.9 kg)  06/29/16 132 lb (59.9 kg)  02/21/16 129 lb (58.5 kg)    Physical Exam  Constitutional: She appears well-developed. No distress.  HENT:  Head: Normocephalic.  Right Ear: External ear normal.  Left Ear: External ear normal.  Nose: Nose normal.  Mouth/Throat: Oropharynx is clear and moist.  Eyes: Conjunctivae are normal. Pupils are equal, round, and reactive to light. Right eye exhibits no discharge. Left eye exhibits no discharge.  Neck: Normal range of motion. Neck supple. No JVD present. No tracheal deviation present. No thyromegaly present.  Cardiovascular: Normal rate, regular rhythm and  normal heart sounds.   Pulmonary/Chest: No stridor. No respiratory distress. She has no wheezes.  Abdominal: Soft. Bowel sounds are normal. She exhibits mass. She exhibits no distension. There is no tenderness. There is no rebound and no guarding.  Musculoskeletal: She exhibits no edema or tenderness.  Lymphadenopathy:    She has no cervical adenopathy.  Neurological: She displays normal reflexes. No cranial nerve deficit. She exhibits normal muscle tone. Coordination normal.  Skin: No rash  noted. No erythema.  Psychiatric: She has a normal mood and affect. Her behavior is normal. Judgment and thought content normal.  >1 cm umbilical hernia - NT  Lab Results  Component Value Date   WBC 6.3 08/16/2014   HGB 12.5 08/16/2014   HCT 37.2 08/16/2014   PLT 212.0 08/16/2014   GLUCOSE 93 06/24/2015   CHOL 240 (H) 02/16/2014   TRIG 90.0 02/16/2014   HDL 50.60 02/16/2014   LDLDIRECT 196.7 12/22/2010   LDLCALC 171 (H) 02/16/2014   ALT 11 02/16/2014   AST 14 02/16/2014   NA 141 06/24/2015   K 3.9 06/24/2015   CL 104 06/24/2015   CREATININE 0.92 03/08/2016   BUN 11 03/08/2016   CO2 31 06/24/2015   TSH 1.65 02/16/2014   INR 0.96 03/24/2010   HGBA1C 5.7 04/26/2010    Mm Screening Breast Tomo Bilateral  Result Date: 09/06/2016 CLINICAL DATA:  Screening. EXAM: 2D DIGITAL SCREENING BILATERAL MAMMOGRAM WITH CAD AND ADJUNCT TOMO COMPARISON:  Previous exam(s). ACR Breast Density Category c: The breast tissue is heterogeneously dense, which may obscure small masses. FINDINGS: There are no findings suspicious for malignancy. Images were processed with CAD. IMPRESSION: No mammographic evidence of malignancy. A result letter of this screening mammogram will be mailed directly to the patient. RECOMMENDATION: Screening mammogram in one year. (Code:SM-B-01Y) BI-RADS CATEGORY  1: Negative. Electronically Signed   By: Ammie Ferrier M.D.   On: 09/07/2016 13:19    Assessment & Plan:   Diagnoses and all orders for this visit:  B12 deficiency  Cough  Gastroesophageal reflux disease without esophagitis  Other orders -     cyanocobalamin 500 MCG tablet; Take 2 tablets (1,000 mcg total) by mouth daily.   I have discontinued Ms. Clack's Dexlansoprazole. I have also changed her cyanocobalamin. Additionally, I am having her maintain her Cholecalciferol, zoledronic acid, loratadine, triamcinolone ointment, meclizine, calcium-vitamin D, loperamide, oxybutynin, and DEXILANT.  Meds ordered  this encounter  Medications  . cyanocobalamin 500 MCG tablet    Sig: Take 2 tablets (1,000 mcg total) by mouth daily.    Dispense:  100 tablet    Refill:  3     Follow-up: No Follow-up on file.  Walker Kehr, MD

## 2016-11-14 DIAGNOSIS — H5702 Anisocoria: Secondary | ICD-10-CM | POA: Diagnosis not present

## 2016-11-14 DIAGNOSIS — H5340 Unspecified visual field defects: Secondary | ICD-10-CM | POA: Diagnosis not present

## 2016-11-14 DIAGNOSIS — H524 Presbyopia: Secondary | ICD-10-CM | POA: Diagnosis not present

## 2016-11-14 DIAGNOSIS — H40023 Open angle with borderline findings, high risk, bilateral: Secondary | ICD-10-CM | POA: Diagnosis not present

## 2016-11-21 ENCOUNTER — Encounter: Payer: Medicare Other | Admitting: Gynecology

## 2016-12-05 ENCOUNTER — Ambulatory Visit (INDEPENDENT_AMBULATORY_CARE_PROVIDER_SITE_OTHER): Payer: Medicare Other | Admitting: Gynecology

## 2016-12-05 ENCOUNTER — Encounter: Payer: Self-pay | Admitting: Gynecology

## 2016-12-05 VITALS — BP 120/74 | Ht <= 58 in | Wt 128.0 lb

## 2016-12-05 DIAGNOSIS — M810 Age-related osteoporosis without current pathological fracture: Secondary | ICD-10-CM

## 2016-12-05 DIAGNOSIS — Z8639 Personal history of other endocrine, nutritional and metabolic disease: Secondary | ICD-10-CM

## 2016-12-05 DIAGNOSIS — Z01411 Encounter for gynecological examination (general) (routine) with abnormal findings: Secondary | ICD-10-CM

## 2016-12-05 NOTE — Patient Instructions (Signed)
Bone Densitometry Bone densitometry is an imaging test that uses a special X-ray to measure the amount of calcium and other minerals in your bones (bone density). This test is also known as a bone mineral density test or dual-energy X-ray absorptiometry (DXA). The test can measure bone density at your hip and your spine. It is similar to having a regular X-ray. You may have this test to:  Diagnose a condition that causes weak or thin bones (osteoporosis).  Predict your risk of a broken bone (fracture).  Determine how well osteoporosis treatment is working. Tell a health care provider about:  Any allergies you have.  All medicines you are taking, including vitamins, herbs, eye drops, creams, and over-the-counter medicines.  Any problems you or family members have had with anesthetic medicines.  Any blood disorders you have.  Any surgeries you have had.  Any medical conditions you have.  Possibility of pregnancy.  Any other medical test you had within the previous 14 days that used contrast material. What are the risks? Generally, this is a safe procedure. However, problems can occur and may include the following:  This test exposes you to a very small amount of radiation.  The risks of radiation exposure may be greater to unborn children. What happens before the procedure?  Do not take any calcium supplements for 24 hours before having the test. You can otherwise eat and drink what you usually do.  Take off all metal jewelry, eyeglasses, dental appliances, and any other metal objects. What happens during the procedure?  You may lie on an exam table. There will be an X-ray generator below you and an imaging device above you.  Other devices, such as boxes or braces, may be used to position your body properly for the scan.  You will need to lie still while the machine slowly scans your body.  The images will show up on a computer monitor. What happens after the  procedure? You may need more testing at a later time. This information is not intended to replace advice given to you by your health care provider. Make sure you discuss any questions you have with your health care provider. Document Released: 10/02/2004 Document Revised: 02/16/2016 Document Reviewed: 02/18/2014 Elsevier Interactive Patient Education  2017 [employer]  

## 2016-12-05 NOTE — Progress Notes (Signed)
Joyce Riley 01-20-1934 [id number]   History:    83 y.o.  for annual gyn exam with no complaints today. Patient with known history of osteoporosis has been on Reclast IV and has received 7 infusions (7 years). This June will be infusion #8.Her last bone density study in 2016 demonstrated her lowest T score was at the distal one third radius of the left arm with a T score -3.0. Right and left femoral neck with no statistically significant change. Patient had a colonoscopy in 2016 benign polyps were removed. Patient also with history of vitamin D deficiency in the past.She is currently taking calcium and vitamin D daily.Dr. Alain Marion is her primary physician who has been treating her for hyperlipidemia as well as vitamin D deficiency. Patient denies any past history of Pap smears in the past when she was living in [place].   Past medical history,surgical history, family history and social history were all reviewed and documented in the EPIC chart.  Gynecologic History No LMP recorded. Patient is postmenopausal. Contraception: post menopausal status Last Pap: 2012. Results were: normal Last mammogram: 2017. Results were: normal  Obstetric History OB History  Gravida Para Term Preterm AB Living  4 1 1   3 1   SAB TAB Ectopic Multiple Live Births  3       1    # Outcome Date GA Lbr Len/2nd Weight Sex Delivery Anes PTL Lv  4 SAB           3 SAB           2 SAB           1 Term     F Vag-Spont  N LIV       ROS: A ROS was performed and pertinent positives and negatives are included in the history.  GENERAL: No fevers or chills. HEENT: No change in vision, no earache, sore throat or sinus congestion. NECK: No pain or stiffness. CARDIOVASCULAR: No chest pain or pressure. No palpitations. PULMONARY: No shortness of breath, cough or wheeze. GASTROINTESTINAL: No abdominal pain, nausea, vomiting or diarrhea, melena or bright red blood per rectum. GENITOURINARY: No urinary frequency, urgency,  hesitancy or dysuria. MUSCULOSKELETAL: No joint or muscle pain, no back pain, no recent trauma. DERMATOLOGIC: No rash, no itching, no lesions. ENDOCRINE: No polyuria, polydipsia, no heat or cold intolerance. No recent change in weight. HEMATOLOGICAL: No anemia or easy bruising or bleeding. NEUROLOGIC: No headache, seizures, numbness, tingling or weakness. PSYCHIATRIC: No depression, no loss of interest in normal activity or change in sleep pattern.     Exam: chaperone present  BP 120/74   Ht 4\' 7"  (1.397 m)   Wt 128 lb (58.1 kg)   BMI 29.75 kg/m   Body mass index is 29.75 kg/m.  General appearance : Well developed well nourished female. No acute distress HEENT: Eyes: no retinal hemorrhage or exudates,  Neck supple, trachea midline, no carotid bruits, no thyroidmegaly Lungs: Clear to auscultation, no rhonchi or wheezes, or rib retractions  Heart: Regular rate and rhythm, no murmurs or gallops Breast:Examined in sitting and supine position were symmetrical in appearance, no palpable masses or tenderness,  no skin retraction, no nipple inversion, no nipple discharge, no skin discoloration, no axillary or supraclavicular lymphadenopathy Abdomen: no palpable masses or tenderness, no rebound or guarding Extremities: no edema or skin discoloration or tenderness  Pelvic:  Bartholin, Urethra, Skene Glands: Within normal limits  Vagina: No gross lesions or discharge, atrophic changes  Cervix: No gross lesions or discharge  Uterus  anteverted, normal size, shape and consistency, non-tender and mobile  Adnexa  Without masses or tenderness  Anus and perineum  normal   Rectovaginal  normal sphincter tone without palpated masses or tenderness             Hemoccult PCP provides     Assessment/Plan:  83 y.o. female for annual exam with history of osteoporosis due for her next bone density study which will be schedule here in the next few weeks. This June she will receive her eighth IV  infusion of Reclast if bone density study indicating improvement her stability next year we may want to switch her to a different agent such as Prolia  60 mg subcutaneous every 6 months. Because of her history vitamin D deficiency will be checked today. Her PCP has been doing the remainder of her blood work and her vaccines are up-to-date. We once again stressed importance of daily calcium vitamin D and frequent weightbearing exercises. Pap smear no longer indicated according to the new guidelines. All this was accomplished with the use of a [place] translator today.   An additional 15 minutes was spent reviewing past bone density studies as well as prior infusions and also discussing the recommendations and guidelines and future follow-up and changes in medication for her osteoporosis.   Terrance Mass MD, 11:46 AM 12/05/2016

## 2016-12-06 LAB — VITAMIN D 25 HYDROXY (VIT D DEFICIENCY, FRACTURES): VIT D 25 HYDROXY: 46 ng/mL (ref 30–100)

## 2016-12-18 ENCOUNTER — Ambulatory Visit (INDEPENDENT_AMBULATORY_CARE_PROVIDER_SITE_OTHER): Payer: Medicare Other

## 2016-12-18 DIAGNOSIS — M810 Age-related osteoporosis without current pathological fracture: Secondary | ICD-10-CM | POA: Diagnosis not present

## 2016-12-18 DIAGNOSIS — Z8639 Personal history of other endocrine, nutritional and metabolic disease: Secondary | ICD-10-CM

## 2017-02-06 ENCOUNTER — Encounter: Payer: Self-pay | Admitting: Gynecology

## 2017-03-04 ENCOUNTER — Encounter: Payer: Self-pay | Admitting: Internal Medicine

## 2017-03-04 ENCOUNTER — Other Ambulatory Visit (INDEPENDENT_AMBULATORY_CARE_PROVIDER_SITE_OTHER): Payer: Medicare Other

## 2017-03-04 ENCOUNTER — Ambulatory Visit (INDEPENDENT_AMBULATORY_CARE_PROVIDER_SITE_OTHER): Payer: Medicare Other | Admitting: Internal Medicine

## 2017-03-04 DIAGNOSIS — M545 Low back pain, unspecified: Secondary | ICD-10-CM

## 2017-03-04 DIAGNOSIS — Z0000 Encounter for general adult medical examination without abnormal findings: Secondary | ICD-10-CM | POA: Insufficient documentation

## 2017-03-04 DIAGNOSIS — E785 Hyperlipidemia, unspecified: Secondary | ICD-10-CM | POA: Diagnosis not present

## 2017-03-04 DIAGNOSIS — E538 Deficiency of other specified B group vitamins: Secondary | ICD-10-CM | POA: Diagnosis not present

## 2017-03-04 DIAGNOSIS — K219 Gastro-esophageal reflux disease without esophagitis: Secondary | ICD-10-CM | POA: Diagnosis not present

## 2017-03-04 DIAGNOSIS — G8929 Other chronic pain: Secondary | ICD-10-CM | POA: Diagnosis not present

## 2017-03-04 LAB — URINALYSIS
BILIRUBIN URINE: NEGATIVE
Hgb urine dipstick: NEGATIVE
KETONES UR: NEGATIVE
Leukocytes, UA: NEGATIVE
Nitrite: NEGATIVE
SPECIFIC GRAVITY, URINE: 1.01 (ref 1.000–1.030)
Total Protein, Urine: NEGATIVE
UROBILINOGEN UA: 0.2 (ref 0.0–1.0)
Urine Glucose: NEGATIVE
pH: 8.5 — AB (ref 5.0–8.0)

## 2017-03-04 LAB — CBC WITH DIFFERENTIAL/PLATELET
BASOS PCT: 0.7 % (ref 0.0–3.0)
Basophils Absolute: 0 10*3/uL (ref 0.0–0.1)
EOS PCT: 0.8 % (ref 0.0–5.0)
Eosinophils Absolute: 0 10*3/uL (ref 0.0–0.7)
HCT: 38 % (ref 36.0–46.0)
Hemoglobin: 12.8 g/dL (ref 12.0–15.0)
Lymphocytes Relative: 32.4 % (ref 12.0–46.0)
Lymphs Abs: 1.9 10*3/uL (ref 0.7–4.0)
MCHC: 33.8 g/dL (ref 30.0–36.0)
MCV: 89.4 fl (ref 78.0–100.0)
MONO ABS: 0.5 10*3/uL (ref 0.1–1.0)
Monocytes Relative: 8.1 % (ref 3.0–12.0)
NEUTROS ABS: 3.3 10*3/uL (ref 1.4–7.7)
Neutrophils Relative %: 58 % (ref 43.0–77.0)
Platelets: 202 10*3/uL (ref 150.0–400.0)
RBC: 4.24 Mil/uL (ref 3.87–5.11)
RDW: 12.8 % (ref 11.5–15.5)
WBC: 5.8 10*3/uL (ref 4.0–10.5)

## 2017-03-04 LAB — HEPATIC FUNCTION PANEL
ALBUMIN: 4.1 g/dL (ref 3.5–5.2)
ALT: 13 U/L (ref 0–35)
AST: 14 U/L (ref 0–37)
Alkaline Phosphatase: 49 U/L (ref 39–117)
BILIRUBIN TOTAL: 0.5 mg/dL (ref 0.2–1.2)
Bilirubin, Direct: 0.1 mg/dL (ref 0.0–0.3)
Total Protein: 7.1 g/dL (ref 6.0–8.3)

## 2017-03-04 LAB — LIPID PANEL
Cholesterol: 245 mg/dL — ABNORMAL HIGH (ref 0–200)
HDL: 57 mg/dL (ref >39.00)
LDL Cholesterol: 162 mg/dL — ABNORMAL HIGH (ref 0–99)
NONHDL: 187.57
Total CHOL/HDL Ratio: 4
Triglycerides: 126 mg/dL (ref 0.0–149.0)
VLDL: 25.2 mg/dL (ref 0.0–40.0)

## 2017-03-04 LAB — BASIC METABOLIC PANEL
BUN: 10 mg/dL (ref 6–23)
CHLORIDE: 105 meq/L (ref 96–112)
CO2: 30 meq/L (ref 19–32)
Calcium: 9.5 mg/dL (ref 8.4–10.5)
Creatinine, Ser: 1.04 mg/dL (ref 0.40–1.20)
GFR: 54.08 mL/min — ABNORMAL LOW (ref >60.00)
GLUCOSE: 97 mg/dL (ref 70–99)
POTASSIUM: 4.5 meq/L (ref 3.5–5.1)
SODIUM: 143 meq/L (ref 135–145)

## 2017-03-04 LAB — VITAMIN B12: VITAMIN B 12: 1128 pg/mL — AB (ref 211–911)

## 2017-03-04 LAB — TSH: TSH: 1.96 u[IU]/mL (ref 0.35–4.50)

## 2017-03-04 MED ORDER — ZOSTER VAC RECOMB ADJUVANTED 50 MCG/0.5ML IM SUSR: 0.5000 mL | Freq: Once | INTRAMUSCULAR | 1 refills | Status: AC

## 2017-03-04 MED ORDER — DEXLANSOPRAZOLE 30 MG PO CPDR: 1.0000 | DELAYED_RELEASE_CAPSULE | Freq: Every day | ORAL | 2 refills | Status: DC

## 2017-03-04 NOTE — Progress Notes (Signed)
Subjective:  Patient ID: Joyce Riley, female    DOB: 1934-08-04  Age: 83 y.o. MRN: [id number]  CC: No chief complaint on file.   HPI Joyce Riley presents for a [facility] well exam  Outpatient Medications Prior to Visit  Medication Sig Dispense Refill  . calcium-vitamin D (OSCAL WITH D) 500-200 MG-UNIT tablet Take 1 tablet by mouth 2 (two) times daily. 100 tablet 3  . Cholecalciferol 1000 UNITS tablet Take 1 tablet (1,000 Units total) by mouth daily. 100 tablet 3  . cyanocobalamin 500 MCG tablet Take 2 tablets (1,000 mcg total) by mouth daily. 100 tablet 3  . DEXILANT 30 MG capsule TAKE ONE CAPSULE BY MOUTH DAILY 90 capsule 1  . loperamide (IMODIUM A-D) 2 MG tablet Take 1-2 tablets (2-4 mg total) by mouth 4 (four) times daily as needed for diarrhea or loose stools. 60 tablet 1  . loratadine (CLARITIN) 10 MG tablet Take 1 tablet (10 mg total) by mouth daily as needed for allergies. 30 tablet 3  . meclizine (ANTIVERT) 12.5 MG tablet Take 1 tablet (12.5 mg total) by mouth 3 (three) times daily as needed for dizziness or nausea. 60 tablet 2  . oxybutynin (DITROPAN) 5 MG tablet Take 1 tablet (5 mg total) by mouth every 8 (eight) hours as needed for bladder spasms (incontinence). 90 tablet 3  . triamcinolone ointment (KENALOG) 0.5 % Apply 1 application topically 2 (two) times daily. 60 g 3  . zoledronic acid (RECLAST) 5 MG/100ML SOLN Inject 100 mLs (5 mg total) into the vein once. q 12 mo started in 2012 per gyn 100 mL 0   No facility-administered medications prior to visit.     ROS Review of Systems  Constitutional: Negative for activity change, appetite change, chills, fatigue and unexpected weight change.  HENT: Negative for congestion, mouth sores and sinus pressure.   Eyes: Negative for visual disturbance.  Respiratory: Negative for cough and chest tightness.   Gastrointestinal: Negative for abdominal pain and nausea.  Genitourinary: Negative for difficulty urinating, frequency and  vaginal pain.  Musculoskeletal: Positive for back pain. Negative for gait problem.  Skin: Negative for pallor and rash.  Neurological: Negative for dizziness, tremors, weakness, numbness and headaches.  Psychiatric/Behavioral: Negative for confusion, sleep disturbance and suicidal ideas. The patient is not nervous/anxious.     Objective:  BP (!) 142/82 (BP Location: Left Arm, Patient Position: Sitting, Cuff Size: Normal)   Pulse 68   Temp 98.3 F (36.8 C) (Oral)   Ht 4\' 7"  (1.397 m)   Wt 126 lb (57.2 kg)   SpO2 99%   BMI 29.29 kg/m   BP Readings from Last 3 Encounters:  03/04/17 (!) 142/82  12/05/16 120/74  11/02/16 138/80    Wt Readings from Last 3 Encounters:  03/04/17 126 lb (57.2 kg)  12/05/16 128 lb (58.1 kg)  11/02/16 127 lb 12 oz (57.9 kg)    Physical Exam  Constitutional: She appears well-developed. No distress.  HENT:  Head: Normocephalic.  Right Ear: External ear normal.  Left Ear: External ear normal.  Nose: Nose normal.  Mouth/Throat: Oropharynx is clear and moist.  Eyes: Conjunctivae are normal. Pupils are equal, round, and reactive to light. Right eye exhibits no discharge. Left eye exhibits no discharge.  Neck: Normal range of motion. Neck supple. No JVD present. No tracheal deviation present. No thyromegaly present.  Cardiovascular: Normal rate, regular rhythm and normal heart sounds.   Pulmonary/Chest: No stridor. No respiratory distress. She has no wheezes.  Abdominal:  Soft. Bowel sounds are normal. She exhibits no distension and no mass. There is no tenderness. There is no rebound and no guarding.  Musculoskeletal: She exhibits no edema or tenderness.  Lymphadenopathy:    She has no cervical adenopathy.  Neurological: She displays normal reflexes. No cranial nerve deficit. She exhibits normal muscle tone. Coordination normal.  Skin: No rash noted. No erythema.  Psychiatric: She has a normal mood and affect. Her behavior is normal. Judgment and  thought content normal.    Lab Results  Component Value Date   WBC 6.3 08/16/2014   HGB 12.5 08/16/2014   HCT 37.2 08/16/2014   PLT 212.0 08/16/2014   GLUCOSE 93 06/24/2015   CHOL 240 (H) 02/16/2014   TRIG 90.0 02/16/2014   HDL 50.60 02/16/2014   LDLDIRECT 196.7 12/22/2010   LDLCALC 171 (H) 02/16/2014   ALT 11 02/16/2014   AST 14 02/16/2014   NA 141 06/24/2015   K 3.9 06/24/2015   CL 104 06/24/2015   CREATININE 0.92 03/08/2016   BUN 11 03/08/2016   CO2 31 06/24/2015   TSH 1.65 02/16/2014   INR 0.96 03/24/2010   HGBA1C 5.7 04/26/2010    Mm Screening Breast Tomo Bilateral  Result Date: 09/06/2016 CLINICAL DATA:  Screening. EXAM: 2D DIGITAL SCREENING BILATERAL MAMMOGRAM WITH CAD AND ADJUNCT TOMO COMPARISON:  Previous exam(s). ACR Breast Density Category c: The breast tissue is heterogeneously dense, which may obscure small masses. FINDINGS: There are no findings suspicious for malignancy. Images were processed with CAD. IMPRESSION: No mammographic evidence of malignancy. A result letter of this screening mammogram will be mailed directly to the patient. RECOMMENDATION: Screening mammogram in one year. (Code:SM-B-01Y) BI-RADS CATEGORY  1: Negative. Electronically Signed   By: Ammie Ferrier M.D.   On: 09/07/2016 13:19    Assessment & Plan:   There are no diagnoses linked to this encounter. I am having Joyce Riley maintain her Cholecalciferol, zoledronic acid, loratadine, triamcinolone ointment, meclizine, calcium-vitamin D, loperamide, oxybutynin, DEXILANT, and cyanocobalamin.  No orders of the defined types were placed in this encounter.    Follow-up: No Follow-up on file.  Walker Kehr, MD

## 2017-03-04 NOTE — Assessment & Plan Note (Signed)
Discussed.

## 2017-03-04 NOTE — Patient Instructions (Signed)
Health Maintenance for Postmenopausal Women Menopause is a normal process in which your reproductive ability comes to an end. This process happens gradually over a span of months to years, usually between the ages of 51 and 17. Menopause is complete when you have missed 12 consecutive menstrual periods. It is important to talk with your health care provider about some of the most common conditions that affect postmenopausal women, such as heart disease, cancer, and bone loss (osteoporosis). Adopting a healthy lifestyle and getting preventive care can help to promote your health and wellness. Those actions can also lower your chances of developing some of these common conditions. What should I know about menopause? During menopause, you may experience a number of symptoms, such as:  Moderate-to-severe hot flashes.  Night sweats.  Decrease in sex drive.  Mood swings.  Headaches.  Tiredness.  Irritability.  Memory problems.  Insomnia.  Choosing to treat or not to treat menopausal changes is an individual decision that you make with your health care provider. What should I know about hormone replacement therapy and supplements? Hormone therapy products are effective for treating symptoms that are associated with menopause, such as hot flashes and night sweats. Hormone replacement carries certain risks, especially as you become older. If you are thinking about using estrogen or estrogen with progestin treatments, discuss the benefits and risks with your health care provider. What should I know about heart disease and stroke? Heart disease, heart attack, and stroke become more likely as you age. This may be due, in part, to the hormonal changes that your body experiences during menopause. These can affect how your body processes dietary fats, triglycerides, and cholesterol. Heart attack and stroke are both medical emergencies. There are many things that you can do to help prevent heart disease  and stroke:  Have your blood pressure checked at least every 1-2 years. High blood pressure causes heart disease and increases the risk of stroke.  If you are 90-59 years old, ask your health care provider if you should take aspirin to prevent a heart attack or a stroke.  Do not use any tobacco products, including cigarettes, chewing tobacco, or electronic cigarettes. If you need help quitting, ask your health care provider.  It is important to eat a healthy diet and maintain a healthy weight. ? Be sure to include plenty of vegetables, fruits, low-fat dairy products, and lean protein. ? Avoid eating foods that are high in solid fats, added sugars, or salt (sodium).  Get regular exercise. This is one of the most important things that you can do for your health. ? Try to exercise for at least 150 minutes each week. The type of exercise that you do should increase your heart rate and make you sweat. This is known as moderate-intensity exercise. ? Try to do strengthening exercises at least twice each week. Do these in addition to the moderate-intensity exercise.  Know your numbers.Ask your health care provider to check your cholesterol and your blood glucose. Continue to have your blood tested as directed by your health care provider.  What should I know about cancer screening? There are several types of cancer. Take the following steps to reduce your risk and to catch any cancer development as early as possible. Breast Cancer  Practice breast self-awareness. ? This means understanding how your breasts normally appear and feel. ? It also means doing regular breast self-exams. Let your health care provider know about any changes, no matter how small.  If you are 40  or older, have a clinician do a breast exam (clinical breast exam or CBE) every year. Depending on your age, family history, and medical history, it may be recommended that you also have a yearly breast X-ray (mammogram).  If you  have a family history of breast cancer, talk with your health care provider about genetic screening.  If you are at high risk for breast cancer, talk with your health care provider about having an MRI and a mammogram every year.  Breast cancer (BRCA) gene test is recommended for women who have family members with BRCA-related cancers. Results of the assessment will determine the need for genetic counseling and BRCA1 and for BRCA2 testing. BRCA-related cancers include these types: ? Breast. This occurs in males or females. ? Ovarian. ? Tubal. This may also be called fallopian tube cancer. ? Cancer of the abdominal or pelvic lining (peritoneal cancer). ? Prostate. ? Pancreatic.  Cervical, Uterine, and Ovarian Cancer Your health care provider may recommend that you be screened regularly for cancer of the pelvic organs. These include your ovaries, uterus, and vagina. This screening involves a pelvic exam, which includes checking for microscopic changes to the surface of your cervix (Pap test).  For women ages 21-65, health care providers may recommend a pelvic exam and a Pap test every three years. For women ages 74-65, they may recommend the Pap test and pelvic exam, combined with testing for human papilloma virus (HPV), every five years. Some types of HPV increase your risk of cervical cancer. Testing for HPV may also be done on women of any age who have unclear Pap test results.  Other health care providers may not recommend any screening for nonpregnant women who are considered low risk for pelvic cancer and have no symptoms. Ask your health care provider if a screening pelvic exam is right for you.  If you have had past treatment for cervical cancer or a condition that could lead to cancer, you need Pap tests and screening for cancer for at least 20 years after your treatment. If Pap tests have been discontinued for you, your risk factors (such as having a new sexual partner) need to be  reassessed to determine if you should start having screenings again. Some women have medical problems that increase the chance of getting cervical cancer. In these cases, your health care provider may recommend that you have screening and Pap tests more often.  If you have a family history of uterine cancer or ovarian cancer, talk with your health care provider about genetic screening.  If you have vaginal bleeding after reaching menopause, tell your health care provider.  There are currently no reliable tests available to screen for ovarian cancer.  Lung Cancer Lung cancer screening is recommended for adults 89-61 years old who are at high risk for lung cancer because of a history of smoking. A yearly low-dose CT scan of the lungs is recommended if you:  Currently smoke.  Have a history of at least 30 pack-years of smoking and you currently smoke or have quit within the past 15 years. A pack-year is smoking an average of one pack of cigarettes per day for one year.  Yearly screening should:  Continue until it has been 15 years since you quit.  Stop if you develop a health problem that would prevent you from having lung cancer treatment.  Colorectal Cancer  This type of cancer can be detected and can often be prevented.  Routine colorectal cancer screening usually begins at  age 42 and continues through age 45.  If you have risk factors for colon cancer, your health care provider may recommend that you be screened at an earlier age.  If you have a family history of colorectal cancer, talk with your health care provider about genetic screening.  Your health care provider may also recommend using home test kits to check for hidden blood in your stool.  A small camera at the end of a tube can be used to examine your colon directly (sigmoidoscopy or colonoscopy). This is done to check for the earliest forms of colorectal cancer.  Direct examination of the colon should be repeated every  5-10 years until age 71. However, if early forms of precancerous polyps or small growths are found or if you have a family history or genetic risk for colorectal cancer, you may need to be screened more often.  Skin Cancer  Check your skin from head to toe regularly.  Monitor any moles. Be sure to tell your health care provider: ? About any new moles or changes in moles, especially if there is a change in a mole's shape or color. ? If you have a mole that is larger than the size of a pencil eraser.  If any of your family members has a history of skin cancer, especially at a young age, talk with your health care provider about genetic screening.  Always use sunscreen. Apply sunscreen liberally and repeatedly throughout the day.  Whenever you are outside, protect yourself by wearing long sleeves, pants, a wide-brimmed hat, and sunglasses.  What should I know about osteoporosis? Osteoporosis is a condition in which bone destruction happens more quickly than new bone creation. After menopause, you may be at an increased risk for osteoporosis. To help prevent osteoporosis or the bone fractures that can happen because of osteoporosis, the following is recommended:  If you are 46-71 years old, get at least 1,000 mg of calcium and at least 600 mg of vitamin D per day.  If you are older than age 55 but younger than age 65, get at least 1,200 mg of calcium and at least 600 mg of vitamin D per day.  If you are older than age 54, get at least 1,200 mg of calcium and at least 800 mg of vitamin D per day.  Smoking and excessive alcohol intake increase the risk of osteoporosis. Eat foods that are rich in calcium and vitamin D, and do weight-bearing exercises several times each week as directed by your health care provider. What should I know about how menopause affects my mental health? Depression may occur at any age, but it is more common as you become older. Common symptoms of depression  include:  Low or sad mood.  Changes in sleep patterns.  Changes in appetite or eating patterns.  Feeling an overall lack of motivation or enjoyment of activities that you previously enjoyed.  Frequent crying spells.  Talk with your health care provider if you think that you are experiencing depression. What should I know about immunizations? It is important that you get and maintain your immunizations. These include:  Tetanus, diphtheria, and pertussis (Tdap) booster vaccine.  Influenza every year before the flu season begins.  Pneumonia vaccine.  Shingles vaccine.  Your health care provider may also recommend other immunizations. This information is not intended to replace advice given to you by your health care provider. Make sure you discuss any questions you have with your health care provider. Document Released: 11/02/2005  Document Revised: 03/30/2016 Document Reviewed: 06/14/2015 Elsevier Interactive Patient Education  [employer].

## 2017-03-04 NOTE — Assessment & Plan Note (Signed)
Here for medicare wellness/physical  Diet: heart healthy  Physical activity: sedentary  Depression/mood screen: negative  Hearing: decreased to whispered voice  Visual acuity: grossly normal, performs annual eye exam  ADLs: capable  Fall risk: low  Home safety: good  Cognitive evaluation: intact to orientation, naming, recall and repetition  EOL planning: adv directives, full code/ I agree  I have personally reviewed and have noted  1. The patient's medical, surgical and social history  2. Their use of alcohol, tobacco or illicit drugs  3. Their current medications and supplements  4. The patient's functional ability including ADL's, fall risks, home safety risks and hearing or visual impairment.  5. Diet and physical activities  6. Evidence for depression or mood disorders 7. The roster of all physicians providing medical care to patient - is listed in the Snapshot section of the chart and reviewed today.    Today patient counseled on age appropriate routine health concerns for screening and prevention, each reviewed and up to date or declined. Immunizations reviewed and up to date or declined. Labs ordered and reviewed. Risk factors for depression reviewed and negative. Hearing function and visual acuity are intact. ADLs screened and addressed as needed. Functional ability and level of safety reviewed and appropriate. Education, counseling and referrals performed based on assessed risks today. Patient provided with a copy of personalized plan for preventive services.

## 2017-04-04 ENCOUNTER — Telehealth: Payer: Self-pay | Admitting: Gynecology

## 2017-04-04 DIAGNOSIS — M810 Age-related osteoporosis without current pathological fracture: Secondary | ICD-10-CM

## 2017-04-04 NOTE — Telephone Encounter (Addendum)
Labs work to be scheduled for Reclast. Left VM for daughter [name] [facility])  to call and schedule labs and if she has questions talk to [place]. Labs ordered.

## 2017-04-09 NOTE — Telephone Encounter (Signed)
Spoke to pt daughters [name]. Pt coming in for labs on 04/11/17 at 11am. Orders in computer.  Once labs returned will set up infusion. I will call daughter back to do so. KW CMA

## 2017-04-11 ENCOUNTER — Other Ambulatory Visit: Payer: Medicare Other

## 2017-04-11 DIAGNOSIS — M810 Age-related osteoporosis without current pathological fracture: Secondary | ICD-10-CM | POA: Diagnosis not present

## 2017-04-12 LAB — CALCIUM: Calcium: 8.8 mg/dL (ref 8.6–10.4)

## 2017-04-12 LAB — BUN: BUN: 12 mg/dL (ref 7–25)

## 2017-04-12 LAB — CREATININE, SERUM: Creat: 1.08 mg/dL — ABNORMAL HIGH (ref 0.60–0.88)

## 2017-04-15 NOTE — Telephone Encounter (Signed)
Patient is to follow-up with Dr.Plotnikov before her next Reclast because her serum creatinine level is elevated. Note from Joyce Riley , Joyce A. Left VM for daughter to make appointment with MD>

## 2017-04-15 NOTE — Telephone Encounter (Signed)
Creatinine elevated therefore Dr Toney Rakes advised to f/u with Dr Alain Marion before proceeding with Reclast. Pts daughter was informed by triage. KW CMA

## 2017-04-16 ENCOUNTER — Ambulatory Visit (INDEPENDENT_AMBULATORY_CARE_PROVIDER_SITE_OTHER): Payer: Medicare Other | Admitting: Internal Medicine

## 2017-04-16 ENCOUNTER — Encounter: Payer: Self-pay | Admitting: Internal Medicine

## 2017-04-16 DIAGNOSIS — N183 Chronic kidney disease, stage 3 unspecified: Secondary | ICD-10-CM | POA: Insufficient documentation

## 2017-04-16 DIAGNOSIS — M810 Age-related osteoporosis without current pathological fracture: Secondary | ICD-10-CM | POA: Diagnosis not present

## 2017-04-16 NOTE — Assessment & Plan Note (Signed)
7/18 GFR 48; CrCl 36.73 CKD stage 3b (40-30% of renal function remaining) It is probably best not to use Reclast since her CrCl is 36.73.  Renal US to r/o obstruction

## 2017-04-16 NOTE — Patient Instructions (Signed)
GFR 48; CrCl 36.73 CKD stage 3b (40-30% of renal function remaining) It is probably best not to use Reclast since her CrCl is 36.73.

## 2017-04-16 NOTE — Assessment & Plan Note (Signed)
GFR 48; CrCl 36.73  CKD stage 3b (40-30% of renal function remaining)  It is probably best not to use Reclast since her CrCl is 36.73.  Will obtain Renal US to r/o obstruction. Thank you!
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
## 2017-04-18 NOTE — Telephone Encounter (Signed)
[name] (daughter) called and Dr Toney Rakes note from Dr Alain Marion stated No more Reclast infusion due to elevated Creatinine .

## 2017-04-23 ENCOUNTER — Telehealth: Payer: Self-pay | Admitting: Internal Medicine

## 2017-04-23 DIAGNOSIS — N183 Chronic kidney disease, stage 3 unspecified: Secondary | ICD-10-CM

## 2017-04-23 NOTE — Telephone Encounter (Signed)
[facility] called regarding the US Abdomen Limited. They said that it should be renal. Can this be changed? Pt is not currently scheduled. They left her a message to schedule this [place].

## 2017-04-23 NOTE — Telephone Encounter (Signed)
US renal ordered

## 2017-05-01 ENCOUNTER — Ambulatory Visit
Admission: RE | Admit: 2017-05-01 | Discharge: 2017-05-01 | Disposition: A | Discharge status: Home / Self Care | Payer: Medicare Other | Source: Ambulatory Visit | Attending: Internal Medicine | Admitting: Internal Medicine

## 2017-05-01 DIAGNOSIS — N183 Chronic kidney disease, stage 3 unspecified: Secondary | ICD-10-CM

## 2017-05-01 DIAGNOSIS — R7989 Other specified abnormal findings of blood chemistry: Secondary | ICD-10-CM | POA: Diagnosis not present

## 2017-06-05 DIAGNOSIS — H40023 Open angle with borderline findings, high risk, bilateral: Secondary | ICD-10-CM | POA: Diagnosis not present

## 2017-06-05 DIAGNOSIS — H35363 Drusen (degenerative) of macula, bilateral: Secondary | ICD-10-CM | POA: Diagnosis not present

## 2017-06-05 DIAGNOSIS — H35373 Puckering of macula, bilateral: Secondary | ICD-10-CM | POA: Diagnosis not present

## 2017-07-02 ENCOUNTER — Encounter: Payer: Self-pay | Admitting: Internal Medicine

## 2017-07-02 ENCOUNTER — Other Ambulatory Visit (INDEPENDENT_AMBULATORY_CARE_PROVIDER_SITE_OTHER): Payer: Medicare Other

## 2017-07-02 ENCOUNTER — Ambulatory Visit (INDEPENDENT_AMBULATORY_CARE_PROVIDER_SITE_OTHER): Payer: Medicare Other | Admitting: Internal Medicine

## 2017-07-02 VITALS — BP 118/70 | HR 66 | Temp 98.4°F | Ht <= 58 in | Wt 120.0 lb

## 2017-07-02 DIAGNOSIS — Z23 Encounter for immunization: Secondary | ICD-10-CM | POA: Diagnosis not present

## 2017-07-02 DIAGNOSIS — G8929 Other chronic pain: Secondary | ICD-10-CM

## 2017-07-02 DIAGNOSIS — K219 Gastro-esophageal reflux disease without esophagitis: Secondary | ICD-10-CM

## 2017-07-02 DIAGNOSIS — N183 Chronic kidney disease, stage 3 unspecified: Secondary | ICD-10-CM

## 2017-07-02 DIAGNOSIS — M545 Low back pain, unspecified: Secondary | ICD-10-CM

## 2017-07-02 DIAGNOSIS — E538 Deficiency of other specified B group vitamins: Secondary | ICD-10-CM | POA: Diagnosis not present

## 2017-07-02 DIAGNOSIS — L500 Allergic urticaria: Secondary | ICD-10-CM | POA: Diagnosis not present

## 2017-07-02 DIAGNOSIS — M810 Age-related osteoporosis without current pathological fracture: Secondary | ICD-10-CM | POA: Diagnosis not present

## 2017-07-02 DIAGNOSIS — E559 Vitamin D deficiency, unspecified: Secondary | ICD-10-CM | POA: Diagnosis not present

## 2017-07-02 LAB — BASIC METABOLIC PANEL
BUN: 12 mg/dL (ref 6–23)
CHLORIDE: 103 meq/L (ref 96–112)
CO2: 29 mEq/L (ref 19–32)
Calcium: 8.7 mg/dL (ref 8.4–10.5)
Creatinine, Ser: 1.03 mg/dL (ref 0.40–1.20)
GFR: 54.64 mL/min — ABNORMAL LOW (ref >60.00)
Glucose, Bld: 93 mg/dL (ref 70–99)
POTASSIUM: 3.9 meq/L (ref 3.5–5.1)
Sodium: 140 mEq/L (ref 135–145)

## 2017-07-02 NOTE — Assessment & Plan Note (Signed)
Off reclast due to CRI. Another GYN appt is pending

## 2017-07-02 NOTE — Assessment & Plan Note (Addendum)
Better on gluten free diet, however has a hyperpigmented itchy spot on R shoulder/trap Try Sebamed

## 2017-07-02 NOTE — Progress Notes (Signed)
Subjective:  Patient ID: Joyce Riley, female    DOB: [birth year]  Age: 83 y.o. MRN: [id number]  CC: No chief complaint on file.   HPI Joyce Riley presents for CRI, Vit D def, osteoporosis, OAB f/u. C/o itching of the skin...  Outpatient Medications Prior to Visit  Medication Sig Dispense Refill  . calcium-vitamin D (OSCAL WITH D) 500-200 MG-UNIT tablet Take 1 tablet by mouth 2 (two) times daily. 100 tablet 3  . Cholecalciferol 1000 UNITS tablet Take 1 tablet (1,000 Units total) by mouth daily. 100 tablet 3  . cyanocobalamin 500 MCG tablet Take 2 tablets (1,000 mcg total) by mouth daily. 100 tablet 3  . Dexlansoprazole (DEXILANT) 30 MG capsule Take 1 capsule (30 mg total) by mouth daily. 90 capsule 2  . loperamide (IMODIUM A-D) 2 MG tablet Take 1-2 tablets (2-4 mg total) by mouth 4 (four) times daily as needed for diarrhea or loose stools. 60 tablet 1  . loratadine (CLARITIN) 10 MG tablet Take 1 tablet (10 mg total) by mouth daily as needed for allergies. 30 tablet 3  . meclizine (ANTIVERT) 12.5 MG tablet Take 1 tablet (12.5 mg total) by mouth 3 (three) times daily as needed for dizziness or nausea. 60 tablet 2  . oxybutynin (DITROPAN) 5 MG tablet Take 1 tablet (5 mg total) by mouth every 8 (eight) hours as needed for bladder spasms (incontinence). 90 tablet 3  . triamcinolone ointment (KENALOG) 0.5 % Apply 1 application topically 2 (two) times daily. 60 g 3  . zoledronic acid (RECLAST) 5 MG/100ML SOLN Inject 100 mLs (5 mg total) into the vein once. q 12 mo started in 2012 per gyn 100 mL 0   No facility-administered medications prior to visit.     ROS Review of Systems  Constitutional: Negative for activity change, appetite change, chills, fatigue and unexpected weight change.  HENT: Negative for congestion, mouth sores and sinus pressure.   Eyes: Negative for visual disturbance.  Respiratory: Negative for cough and chest tightness.   Gastrointestinal: Negative for abdominal  pain and nausea.  Genitourinary: Negative for difficulty urinating, frequency and vaginal pain.  Musculoskeletal: Positive for arthralgias and back pain. Negative for gait problem.  Skin: Negative for pallor and rash.  Neurological: Negative for dizziness, tremors, weakness, numbness and headaches.  Psychiatric/Behavioral: Negative for confusion and sleep disturbance.    Objective:  BP 118/70 (BP Location: Left Arm, Patient Position: Sitting, Cuff Size: Normal)   Pulse 66   Temp 98.4 F (36.9 C) (Oral)   Ht 4\' 7"  (1.397 m)   Wt 120 lb (54.4 kg)   SpO2 98%   BMI 27.89 kg/m   BP Readings from Last 3 Encounters:  07/02/17 118/70  04/16/17 116/68  03/04/17 (!) 142/82    Wt Readings from Last 3 Encounters:  07/02/17 120 lb (54.4 kg)  04/16/17 124 lb (56.2 kg)  03/04/17 126 lb (57.2 kg)    Physical Exam  Constitutional: She appears well-developed. No distress.  HENT:  Head: Normocephalic.  Right Ear: External ear normal.  Left Ear: External ear normal.  Nose: Nose normal.  Mouth/Throat: Oropharynx is clear and moist.  Eyes: Pupils are equal, round, and reactive to light. Conjunctivae are normal. Right eye exhibits no discharge. Left eye exhibits no discharge.  Neck: Normal range of motion. Neck supple. No JVD present. No tracheal deviation present. No thyromegaly present.  Cardiovascular: Normal rate, regular rhythm and normal heart sounds.   Pulmonary/Chest: No stridor. No respiratory distress. She has no  wheezes.  Abdominal: Soft. Bowel sounds are normal. She exhibits no distension and no mass. There is no tenderness. There is no rebound and no guarding.  Musculoskeletal: She exhibits no edema or tenderness.  Lymphadenopathy:    She has no cervical adenopathy.  Neurological: She displays normal reflexes. No cranial nerve deficit. She exhibits normal muscle tone. Coordination normal.  Skin: No rash noted. No erythema.  Psychiatric: She has a normal mood and affect. Her  behavior is normal. Judgment and thought content normal.  scoliosis, kyphosis R trap/shoulder hyperpigmented patch  Lab Results  Component Value Date   WBC 5.8 03/04/2017   HGB 12.8 03/04/2017   HCT 38.0 03/04/2017   PLT 202.0 03/04/2017   GLUCOSE 97 03/04/2017   CHOL 245 (H) 03/04/2017   TRIG 126.0 03/04/2017   HDL 57.00 03/04/2017   LDLDIRECT 196.7 12/22/2010   LDLCALC 162 (H) 03/04/2017   ALT 13 03/04/2017   AST 14 03/04/2017   NA 143 03/04/2017   K 4.5 03/04/2017   CL 105 03/04/2017   CREATININE 1.08 (H) 04/11/2017   BUN 12 04/11/2017   CO2 30 03/04/2017   TSH 1.96 03/04/2017   INR 0.96 03/24/2010   HGBA1C 5.7 04/26/2010    US Renal  Result Date: 05/01/2017 CLINICAL DATA:  Elevated creatinine. EXAM: RENAL / URINARY TRACT ULTRASOUND COMPLETE COMPARISON:  None. FINDINGS: Right Kidney: Length: 9.2 cm. Normal renal cortical thickness and echogenicity. Small parapelvic cysts are noted. No definite renal calculi or hydronephrosis. Left Kidney: Length: 9.5 cm. Normal renal cortical thickness and echogenicity. No focal lesions or hydronephrosis. Bladder: Appears normal for degree of bladder distention. IMPRESSION: Unremarkable renal ultrasound examination. Electronically Signed   By: Marijo Sanes M.D.   On: 05/01/2017 17:30    Assessment & Plan:   There are no diagnoses linked to this encounter. I am having [name] maintain her Cholecalciferol, zoledronic acid, loratadine, triamcinolone ointment, meclizine, calcium-vitamin D, loperamide, oxybutynin, cyanocobalamin, and Dexlansoprazole.  No orders of the defined types were placed in this encounter.    Follow-up: No Follow-up on file.  Walker Kehr, MD

## 2017-07-02 NOTE — Assessment & Plan Note (Signed)
On Vit D 

## 2017-07-02 NOTE — Assessment & Plan Note (Signed)
Chronic sx's Tylenol prn

## 2017-07-02 NOTE — Patient Instructions (Addendum)
Try liquid soap and lotion called SebaMed   Try Tylenol PM for cramps

## 2017-07-02 NOTE — Assessment & Plan Note (Signed)
Dexilant 

## 2017-07-02 NOTE — Assessment & Plan Note (Signed)
On B12 

## 2017-07-02 NOTE — Assessment & Plan Note (Signed)
Monitoring labs 

## 2017-07-03 DIAGNOSIS — Z23 Encounter for immunization: Secondary | ICD-10-CM | POA: Diagnosis not present

## 2017-07-03 NOTE — Addendum Note (Signed)
Addended by: Karren Cobble on: 07/03/2017 12:05 PM   Modules accepted: Orders

## 2017-07-09 ENCOUNTER — Ambulatory Visit: Payer: Medicare Other | Admitting: Internal Medicine

## 2017-08-13 ENCOUNTER — Other Ambulatory Visit: Payer: Self-pay | Admitting: Gynecology

## 2017-08-13 DIAGNOSIS — Z1231 Encounter for screening mammogram for malignant neoplasm of breast: Secondary | ICD-10-CM

## 2017-09-18 ENCOUNTER — Ambulatory Visit
Admission: RE | Admit: 2017-09-18 | Discharge: 2017-09-18 | Disposition: A | Discharge status: Home / Self Care | Payer: Medicare Other | Source: Ambulatory Visit | Attending: Internal Medicine | Admitting: Internal Medicine

## 2017-09-18 DIAGNOSIS — Z1231 Encounter for screening mammogram for malignant neoplasm of breast: Secondary | ICD-10-CM

## 2017-10-07 ENCOUNTER — Encounter: Payer: Self-pay | Admitting: Internal Medicine

## 2017-10-07 ENCOUNTER — Other Ambulatory Visit (INDEPENDENT_AMBULATORY_CARE_PROVIDER_SITE_OTHER): Payer: Medicare Other

## 2017-10-07 ENCOUNTER — Ambulatory Visit (INDEPENDENT_AMBULATORY_CARE_PROVIDER_SITE_OTHER): Payer: Medicare Other | Admitting: Internal Medicine

## 2017-10-07 DIAGNOSIS — N183 Chronic kidney disease, stage 3 unspecified: Secondary | ICD-10-CM

## 2017-10-07 DIAGNOSIS — R21 Rash and other nonspecific skin eruption: Secondary | ICD-10-CM

## 2017-10-07 DIAGNOSIS — G8929 Other chronic pain: Secondary | ICD-10-CM | POA: Diagnosis not present

## 2017-10-07 DIAGNOSIS — M545 Low back pain, unspecified: Secondary | ICD-10-CM

## 2017-10-07 DIAGNOSIS — K219 Gastro-esophageal reflux disease without esophagitis: Secondary | ICD-10-CM | POA: Diagnosis not present

## 2017-10-07 DIAGNOSIS — E559 Vitamin D deficiency, unspecified: Secondary | ICD-10-CM

## 2017-10-07 DIAGNOSIS — E538 Deficiency of other specified B group vitamins: Secondary | ICD-10-CM

## 2017-10-07 LAB — BASIC METABOLIC PANEL
BUN: 14 mg/dL (ref 6–23)
CALCIUM: 8.8 mg/dL (ref 8.4–10.5)
CHLORIDE: 105 meq/L (ref 96–112)
CO2: 30 meq/L (ref 19–32)
CREATININE: 0.87 mg/dL (ref 0.40–1.20)
GFR: 66.34 mL/min (ref >60.00)
Glucose, Bld: 101 mg/dL — ABNORMAL HIGH (ref 70–99)
Potassium: 4 mEq/L (ref 3.5–5.1)
Sodium: 141 mEq/L (ref 135–145)

## 2017-10-07 MED ORDER — DEXLANSOPRAZOLE 30 MG PO CPDR: 1.0000 | DELAYED_RELEASE_CAPSULE | Freq: Every day | ORAL | 3 refills | Status: DC

## 2017-10-07 NOTE — Assessment & Plan Note (Signed)
BMET 

## 2017-10-07 NOTE — Assessment & Plan Note (Signed)
Sebamed helped

## 2017-10-07 NOTE — Assessment & Plan Note (Signed)
Not on Rx 

## 2017-10-07 NOTE — Assessment & Plan Note (Signed)
Vit D 

## 2017-10-07 NOTE — Progress Notes (Signed)
Subjective:  Patient ID: Joyce Riley, female    DOB: [birth year]  Age: 84 y.o. MRN: [id number]  CC: No chief complaint on file.   HPI Makayela Secrest presents for GERD, osteoporosis,  B 12 def f/u  Outpatient Medications Prior to Visit  Medication Sig Dispense Refill  . calcium-vitamin D (OSCAL WITH D) 500-200 MG-UNIT tablet Take 1 tablet by mouth 2 (two) times daily. 100 tablet 3  . Cholecalciferol 1000 UNITS tablet Take 1 tablet (1,000 Units total) by mouth daily. 100 tablet 3  . cyanocobalamin 500 MCG tablet Take 2 tablets (1,000 mcg total) by mouth daily. 100 tablet 3  . Dexlansoprazole (DEXILANT) 30 MG capsule Take 1 capsule (30 mg total) by mouth daily. 90 capsule 2  . loperamide (IMODIUM A-D) 2 MG tablet Take 1-2 tablets (2-4 mg total) by mouth 4 (four) times daily as needed for diarrhea or loose stools. 60 tablet 1  . loratadine (CLARITIN) 10 MG tablet Take 1 tablet (10 mg total) by mouth daily as needed for allergies. 30 tablet 3  . meclizine (ANTIVERT) 12.5 MG tablet Take 1 tablet (12.5 mg total) by mouth 3 (three) times daily as needed for dizziness or nausea. 60 tablet 2  . oxybutynin (DITROPAN) 5 MG tablet Take 1 tablet (5 mg total) by mouth every 8 (eight) hours as needed for bladder spasms (incontinence). 90 tablet 3  . triamcinolone ointment (KENALOG) 0.5 % Apply 1 application topically 2 (two) times daily. 60 g 3  . zoledronic acid (RECLAST) 5 MG/100ML SOLN Inject 100 mLs (5 mg total) into the vein once. q 12 mo started in 2012 per gyn 100 mL 0   No facility-administered medications prior to visit.     ROS Review of Systems  Constitutional: Negative for activity change, appetite change, chills, fatigue and unexpected weight change.  HENT: Negative for congestion, mouth sores and sinus pressure.   Eyes: Negative for visual disturbance.  Respiratory: Negative for cough and chest tightness.   Gastrointestinal: Negative for abdominal pain and nausea.  Genitourinary:  Negative for difficulty urinating, frequency and vaginal pain.  Musculoskeletal: Negative for back pain and gait problem.  Skin: Negative for pallor and rash.  Neurological: Negative for dizziness, tremors, weakness, numbness and headaches.  Psychiatric/Behavioral: Negative for confusion and sleep disturbance.    Objective:  BP 120/70 (BP Location: Left Arm, Patient Position: Sitting, Cuff Size: Normal)   Pulse 63   Temp 98.1 F (36.7 C) (Oral)   Ht 4\' 7"  (1.397 m)   Wt 120 lb (54.4 kg)   SpO2 99%   BMI 27.89 kg/m   BP Readings from Last 3 Encounters:  10/07/17 120/70  07/02/17 118/70  04/16/17 116/68    Wt Readings from Last 3 Encounters:  10/07/17 120 lb (54.4 kg)  07/02/17 120 lb (54.4 kg)  04/16/17 124 lb (56.2 kg)    Physical Exam  Constitutional: She appears well-developed. No distress.  HENT:  Head: Normocephalic.  Right Ear: External ear normal.  Left Ear: External ear normal.  Nose: Nose normal.  Mouth/Throat: Oropharynx is clear and moist.  Eyes: Conjunctivae are normal. Pupils are equal, round, and reactive to light. Right eye exhibits no discharge. Left eye exhibits no discharge.  Neck: Normal range of motion. Neck supple. No JVD present. No tracheal deviation present. No thyromegaly present.  Cardiovascular: Normal rate, regular rhythm and normal heart sounds.  Pulmonary/Chest: No stridor. No respiratory distress. She has no wheezes.  Abdominal: Soft. Bowel sounds are normal. She exhibits  no distension and no mass. There is no tenderness. There is no rebound and no guarding.  Musculoskeletal: She exhibits no edema or tenderness.  Lymphadenopathy:    She has no cervical adenopathy.  Neurological: She displays normal reflexes. No cranial nerve deficit. She exhibits normal muscle tone. Coordination normal.  Skin: No rash noted. No erythema.  Psychiatric: She has a normal mood and affect. Her behavior is normal. Judgment and thought content normal.    Lab  Results  Component Value Date   WBC 5.8 03/04/2017   HGB 12.8 03/04/2017   HCT 38.0 03/04/2017   PLT 202.0 03/04/2017   GLUCOSE 93 07/02/2017   CHOL 245 (H) 03/04/2017   TRIG 126.0 03/04/2017   HDL 57.00 03/04/2017   LDLDIRECT 196.7 12/22/2010   LDLCALC 162 (H) 03/04/2017   ALT 13 03/04/2017   AST 14 03/04/2017   NA 140 07/02/2017   K 3.9 07/02/2017   CL 103 07/02/2017   CREATININE 1.03 07/02/2017   BUN 12 07/02/2017   CO2 29 07/02/2017   TSH 1.96 03/04/2017   INR 0.96 03/24/2010   HGBA1C 5.7 04/26/2010    Mm Screening Breast Tomo Bilateral  Result Date: 09/18/2017 CLINICAL DATA:  Screening. EXAM: 2D DIGITAL SCREENING BILATERAL MAMMOGRAM WITH CAD AND ADJUNCT TOMO COMPARISON:  Previous exam(s). ACR Breast Density Category c: The breast tissue is heterogeneously dense, which may obscure small masses. FINDINGS: There are no findings suspicious for malignancy. Images were processed with CAD. IMPRESSION: No mammographic evidence of malignancy. A result letter of this screening mammogram will be mailed directly to the patient. RECOMMENDATION: Screening mammogram in one year. (Code:SM-B-01Y) BI-RADS CATEGORY  1: Negative. Electronically Signed   By: Dorise Bullion III M.D   On: 09/18/2017 10:40    Assessment & Plan:   There are no diagnoses linked to this encounter. I am having Joyce Riley maintain her Cholecalciferol, zoledronic acid, loratadine, triamcinolone ointment, meclizine, calcium-vitamin D, loperamide, oxybutynin, cyanocobalamin, and Dexlansoprazole.  No orders of the defined types were placed in this encounter.    Follow-up: No Follow-up on file.  Walker Kehr, MD

## 2017-10-07 NOTE — Assessment & Plan Note (Signed)
On B12 

## 2017-10-07 NOTE — Assessment & Plan Note (Signed)
Dexilant 

## 2017-10-08 NOTE — Progress Notes (Signed)
This was mailed out to patient today.

## 2017-11-20 DIAGNOSIS — H903 Sensorineural hearing loss, bilateral: Secondary | ICD-10-CM | POA: Diagnosis not present

## 2017-12-18 ENCOUNTER — Encounter: Payer: Self-pay | Admitting: Gynecology

## 2017-12-18 ENCOUNTER — Ambulatory Visit (INDEPENDENT_AMBULATORY_CARE_PROVIDER_SITE_OTHER): Payer: Medicare Other | Admitting: Gynecology

## 2017-12-18 VITALS — BP 124/78 | Ht <= 58 in | Wt 128.0 lb

## 2017-12-18 DIAGNOSIS — M810 Age-related osteoporosis without current pathological fracture: Secondary | ICD-10-CM

## 2017-12-18 DIAGNOSIS — N952 Postmenopausal atrophic vaginitis: Secondary | ICD-10-CM | POA: Diagnosis not present

## 2017-12-18 DIAGNOSIS — Z01411 Encounter for gynecological examination (general) (routine) with abnormal findings: Secondary | ICD-10-CM

## 2017-12-18 DIAGNOSIS — Z0000 Encounter for general adult medical examination without abnormal findings: Secondary | ICD-10-CM

## 2017-12-18 DIAGNOSIS — N816 Rectocele: Secondary | ICD-10-CM

## 2017-12-18 NOTE — Patient Instructions (Signed)
Follow up in 1 year.

## 2017-12-18 NOTE — Progress Notes (Addendum)
    Joyce Riley [birth year] [id number]        84 y.o.  [id number] for breast and pelvic exam with her interpreter.  Former patient of Dr. Toney Rakes.  History of osteoporosis completing 8 years of Reclast per Dr. Sandrea Hughs note.  Most recent DEXA 11/2016 with T score of distal third of the radius -2.8 which is how the diagnosis of osteoporosis was made in the past.  Her hip T score was -1.5.  Her spine has been excluded for years due to significant scoliosis.  Past medical history,surgical history, problem list, medications, allergies, family history and social history were all reviewed and documented as reviewed in the EPIC chart.  ROS:  Performed with pertinent positives and negatives included in the history, assessment and plan.   Additional significant findings : None   Exam: [name] and [place] provided interpreter assistant Vitals:   12/18/17 1203  BP: 124/78  Weight: 128 lb (58.1 kg)  Height: 4\' 7"  (1.397 m)   Body mass index is 29.75 kg/m.  General appearance:  Normal affect, orientation and appearance. Skin: Grossly normal HEENT: Without gross lesions.  No cervical or supraclavicular adenopathy. Thyroid normal.  Lungs:  Clear without wheezing, rales or rhonchi Cardiac: RR, without RMG Abdominal:  Soft, nontender, without masses, guarding, rebound, organomegaly or hernia Breasts:  Examined lying and sitting without masses, retractions, discharge or axillary adenopathy. Pelvic:  Ext, BUS, Vagina: With atrophic changes.  First to second-degree rectocele noted.  Cervix: With atrophic changes  Uterus: Anteverted, normal size, shape and contour, midline and mobile nontender   Adnexa: Without masses or tenderness    Anus and perineum: Normal   Rectovaginal: Normal sphincter tone without palpated masses or tenderness.    Assessment/Plan:  84 y.o. [id number] female for breast and pelvic exam.   1. Postmenopausal/atrophic genital changes.  No reported hot flushes, night  sweats, vaginal dryness or any bleeding. 2. Mild rectocele noted.  Asymptomatic to the patient. 3. History of osteoporosis based on distal third of radius.  Hip measurements at worst -1.6 several years ago.  Spine not measured due to significant scoliosis.  Had completed 8 years of Reclast.  At this point I would recommend stopping treatment.  Her alternative would be to continue with a different anti-resorptive such as Prolia.  I think given her total picture stopping at this point would be appropriate and repeating her bone density next year and she agrees with this. 4. Colonoscopy 2016.  Repeat at their recommended interval. 5. Mammography 08/2017.  Continue with annual mammography when due.  Breast exam normal today. 6. Pap smear 2012.  No Pap smear done today.  No history of abnormal Pap smears.  Per current screening guidelines will stop screening based on age. 7. Health maintenance.  No routine lab work done as patient does this elsewhere.  Follow-up in 1 year, sooner as needed.   Anastasio Auerbach MD, 12:30 PM 12/18/2017

## 2018-01-01 DIAGNOSIS — H5340 Unspecified visual field defects: Secondary | ICD-10-CM | POA: Diagnosis not present

## 2018-01-01 DIAGNOSIS — H40023 Open angle with borderline findings, high risk, bilateral: Secondary | ICD-10-CM | POA: Diagnosis not present

## 2018-01-01 DIAGNOSIS — H21561 Pupillary abnormality, right eye: Secondary | ICD-10-CM | POA: Diagnosis not present

## 2018-01-01 DIAGNOSIS — H5702 Anisocoria: Secondary | ICD-10-CM | POA: Diagnosis not present

## 2018-02-05 ENCOUNTER — Encounter: Payer: Self-pay | Admitting: Internal Medicine

## 2018-02-05 ENCOUNTER — Other Ambulatory Visit (INDEPENDENT_AMBULATORY_CARE_PROVIDER_SITE_OTHER): Payer: Medicare Other

## 2018-02-05 ENCOUNTER — Ambulatory Visit (INDEPENDENT_AMBULATORY_CARE_PROVIDER_SITE_OTHER): Payer: Medicare Other | Admitting: Internal Medicine

## 2018-02-05 DIAGNOSIS — E538 Deficiency of other specified B group vitamins: Secondary | ICD-10-CM

## 2018-02-05 DIAGNOSIS — N183 Chronic kidney disease, stage 3 unspecified: Secondary | ICD-10-CM

## 2018-02-05 DIAGNOSIS — M79672 Pain in left foot: Secondary | ICD-10-CM

## 2018-02-05 DIAGNOSIS — M79671 Pain in right foot: Secondary | ICD-10-CM

## 2018-02-05 DIAGNOSIS — R609 Edema, unspecified: Secondary | ICD-10-CM | POA: Diagnosis not present

## 2018-02-05 LAB — BASIC METABOLIC PANEL
BUN: 16 mg/dL (ref 6–23)
CHLORIDE: 103 meq/L (ref 96–112)
CO2: 29 meq/L (ref 19–32)
CREATININE: 0.84 mg/dL (ref 0.40–1.20)
Calcium: 8.8 mg/dL (ref 8.4–10.5)
GFR: 69.03 mL/min (ref >60.00)
GLUCOSE: 95 mg/dL (ref 70–99)
POTASSIUM: 4.3 meq/L (ref 3.5–5.1)
Sodium: 139 mEq/L (ref 135–145)

## 2018-02-05 LAB — URIC ACID: Uric Acid, Serum: 4.5 mg/dL (ref 2.4–7.0)

## 2018-02-05 LAB — TSH: TSH: 2.58 u[IU]/mL (ref 0.35–4.50)

## 2018-02-05 NOTE — Patient Instructions (Signed)
Elevate legs  low salt diet Ellastic support knee highs

## 2018-02-05 NOTE — Assessment & Plan Note (Signed)
Low salt diet

## 2018-02-05 NOTE — Assessment & Plan Note (Signed)
due to high salt food - low salt diet Ellastic support knee highs Pt declined Lasix

## 2018-02-05 NOTE — Progress Notes (Signed)
Subjective:  Patient ID: Joyce Riley, female    DOB: 11-29-1933  Age: 84 y.o. MRN: [id number]  CC: No chief complaint on file.   HPI Joyce Riley presents for ankle swelling (new)- worse and ?gout, GERD f/u. She is at the hotel since April 2019 due to renovations in her residence - eats a buffet style, very salty food.  Outpatient Medications Prior to Visit  Medication Sig Dispense Refill  . calcium-vitamin D (OSCAL WITH D) 500-200 MG-UNIT tablet Take 1 tablet by mouth 2 (two) times daily. 100 tablet 3  . Cholecalciferol 1000 UNITS tablet Take 1 tablet (1,000 Units total) by mouth daily. 100 tablet 3  . cyanocobalamin 500 MCG tablet Take 2 tablets (1,000 mcg total) by mouth daily. 100 tablet 3  . Dexlansoprazole (DEXILANT) 30 MG capsule Take 1 capsule (30 mg total) by mouth daily. 90 capsule 3  . loperamide (IMODIUM A-D) 2 MG tablet Take 1-2 tablets (2-4 mg total) by mouth 4 (four) times daily as needed for diarrhea or loose stools. 60 tablet 1  . loratadine (CLARITIN) 10 MG tablet Take 1 tablet (10 mg total) by mouth daily as needed for allergies. 30 tablet 3  . meclizine (ANTIVERT) 12.5 MG tablet Take 1 tablet (12.5 mg total) by mouth 3 (three) times daily as needed for dizziness or nausea. 60 tablet 2  . oxybutynin (DITROPAN) 5 MG tablet Take 1 tablet (5 mg total) by mouth every 8 (eight) hours as needed for bladder spasms (incontinence). 90 tablet 3  . triamcinolone ointment (KENALOG) 0.5 % Apply 1 application topically 2 (two) times daily. 60 g 3  . zoledronic acid (RECLAST) 5 MG/100ML SOLN Inject 100 mLs (5 mg total) into the vein once. q 12 mo started in 2012 per gyn 100 mL 0   No facility-administered medications prior to visit.     ROS Review of Systems  Constitutional: Negative for activity change, appetite change, chills, fatigue and unexpected weight change.  HENT: Negative for congestion, mouth sores and sinus pressure.   Eyes: Negative for visual disturbance.    Respiratory: Negative for cough and chest tightness.   Cardiovascular: Positive for leg swelling.  Gastrointestinal: Negative for abdominal pain and nausea.  Genitourinary: Negative for difficulty urinating, frequency and vaginal pain.  Musculoskeletal: Positive for arthralgias. Negative for back pain and gait problem.  Skin: Negative for pallor and rash.  Neurological: Negative for dizziness, tremors, weakness, numbness and headaches.  Psychiatric/Behavioral: Negative for confusion and sleep disturbance.    Objective:  BP 126/82 (BP Location: Left Arm, Patient Position: Sitting, Cuff Size: Normal)   Pulse 70   Temp 98.2 F (36.8 C) (Oral)   Ht 4\' 7"  (1.397 m)   Wt 125 lb (56.7 kg)   SpO2 99%   BMI 29.05 kg/m   BP Readings from Last 3 Encounters:  02/05/18 126/82  12/18/17 124/78  10/07/17 120/70    Wt Readings from Last 3 Encounters:  02/05/18 125 lb (56.7 kg)  12/18/17 128 lb (58.1 kg)  10/07/17 120 lb (54.4 kg)    Physical Exam  Constitutional: She appears well-developed. No distress.  HENT:  Head: Normocephalic.  Right Ear: External ear normal.  Left Ear: External ear normal.  Nose: Nose normal.  Mouth/Throat: Oropharynx is clear and moist.  Eyes: Pupils are equal, round, and reactive to light. Conjunctivae are normal. Right eye exhibits no discharge. Left eye exhibits no discharge.  Neck: Normal range of motion. Neck supple. No JVD present. No tracheal deviation present.  No thyromegaly present.  Cardiovascular: Normal rate, regular rhythm and normal heart sounds.  Pulmonary/Chest: No stridor. No respiratory distress. She has no wheezes.  Abdominal: Soft. Bowel sounds are normal. She exhibits no distension and no mass. There is no tenderness. There is no rebound and no guarding.  Musculoskeletal: She exhibits edema. She exhibits no tenderness.  Lymphadenopathy:    She has no cervical adenopathy.  Neurological: She displays normal reflexes. No cranial nerve  deficit. She exhibits normal muscle tone. Coordination normal.  Skin: No rash noted. No erythema.  Psychiatric: She has a normal mood and affect. Her behavior is normal. Judgment and thought content normal.   Trace edema B ankles  Lab Results  Component Value Date   WBC 5.8 03/04/2017   HGB 12.8 03/04/2017   HCT 38.0 03/04/2017   PLT 202.0 03/04/2017   GLUCOSE 101 (H) 10/07/2017   CHOL 245 (H) 03/04/2017   TRIG 126.0 03/04/2017   HDL 57.00 03/04/2017   LDLDIRECT 196.7 12/22/2010   LDLCALC 162 (H) 03/04/2017   ALT 13 03/04/2017   AST 14 03/04/2017   NA 141 10/07/2017   K 4.0 10/07/2017   CL 105 10/07/2017   CREATININE 0.87 10/07/2017   BUN 14 10/07/2017   CO2 30 10/07/2017   TSH 1.96 03/04/2017   INR 0.96 03/24/2010   HGBA1C 5.7 04/26/2010    Mm Screening Breast Tomo Bilateral  Result Date: 09/18/2017 CLINICAL DATA:  Screening. EXAM: 2D DIGITAL SCREENING BILATERAL MAMMOGRAM WITH CAD AND ADJUNCT TOMO COMPARISON:  Previous exam(s). ACR Breast Density Category c: The breast tissue is heterogeneously dense, which may obscure small masses. FINDINGS: There are no findings suspicious for malignancy. Images were processed with CAD. IMPRESSION: No mammographic evidence of malignancy. A result letter of this screening mammogram will be mailed directly to the patient. RECOMMENDATION: Screening mammogram in one year. (Code:SM-B-01Y) BI-RADS CATEGORY  1: Negative. Electronically Signed   By: Dorise Bullion III M.D   On: 09/18/2017 10:40    Assessment & Plan:   There are no diagnoses linked to this encounter. I am having Joyce Riley maintain her Cholecalciferol, zoledronic acid, loratadine, triamcinolone ointment, meclizine, calcium-vitamin D, loperamide, oxybutynin, vitamin B-12, and Dexlansoprazole.  No orders of the defined types were placed in this encounter.    Follow-up: No follow-ups on file.  Walker Kehr, MD

## 2018-02-05 NOTE — Assessment & Plan Note (Signed)
On B12 

## 2018-02-05 NOTE — Assessment & Plan Note (Signed)
Labs Treat swelling fam h/o gout

## 2018-05-09 ENCOUNTER — Other Ambulatory Visit (INDEPENDENT_AMBULATORY_CARE_PROVIDER_SITE_OTHER): Payer: Medicare Other

## 2018-05-09 ENCOUNTER — Encounter: Payer: Self-pay | Admitting: Internal Medicine

## 2018-05-09 ENCOUNTER — Ambulatory Visit (INDEPENDENT_AMBULATORY_CARE_PROVIDER_SITE_OTHER): Payer: Medicare Other | Admitting: Internal Medicine

## 2018-05-09 DIAGNOSIS — M545 Low back pain, unspecified: Secondary | ICD-10-CM

## 2018-05-09 DIAGNOSIS — G8929 Other chronic pain: Secondary | ICD-10-CM

## 2018-05-09 DIAGNOSIS — E559 Vitamin D deficiency, unspecified: Secondary | ICD-10-CM | POA: Diagnosis not present

## 2018-05-09 DIAGNOSIS — E538 Deficiency of other specified B group vitamins: Secondary | ICD-10-CM | POA: Diagnosis not present

## 2018-05-09 DIAGNOSIS — R197 Diarrhea, unspecified: Secondary | ICD-10-CM

## 2018-05-09 DIAGNOSIS — N183 Chronic kidney disease, stage 3 unspecified: Secondary | ICD-10-CM

## 2018-05-09 DIAGNOSIS — R609 Edema, unspecified: Secondary | ICD-10-CM

## 2018-05-09 LAB — BASIC METABOLIC PANEL
BUN: 18 mg/dL (ref 6–23)
CALCIUM: 9.2 mg/dL (ref 8.4–10.5)
CO2: 32 mEq/L (ref 19–32)
CREATININE: 1.02 mg/dL (ref 0.40–1.20)
Chloride: 105 mEq/L (ref 96–112)
GFR: 55.14 mL/min — ABNORMAL LOW (ref >60.00)
Glucose, Bld: 93 mg/dL (ref 70–99)
Potassium: 4.2 mEq/L (ref 3.5–5.1)
Sodium: 142 mEq/L (ref 135–145)

## 2018-05-09 NOTE — Assessment & Plan Note (Signed)
Labs

## 2018-05-09 NOTE — Progress Notes (Signed)
Subjective:  Patient ID: Joyce Riley, female    DOB: 1933/11/29  Age: 84 y.o. MRN: [id number]  CC: No chief complaint on file.   HPI Maxie Debose presents for osteoporosis, diarrhea, allergies. F/u LBP - no change  Outpatient Medications Prior to Visit  Medication Sig Dispense Refill  . calcium-vitamin D (OSCAL WITH D) 500-200 MG-UNIT tablet Take 1 tablet by mouth 2 (two) times daily. 100 tablet 3  . Cholecalciferol 1000 UNITS tablet Take 1 tablet (1,000 Units total) by mouth daily. 100 tablet 3  . cyanocobalamin 500 MCG tablet Take 2 tablets (1,000 mcg total) by mouth daily. 100 tablet 3  . Dexlansoprazole (DEXILANT) 30 MG capsule Take 1 capsule (30 mg total) by mouth daily. 90 capsule 3  . loperamide (IMODIUM A-D) 2 MG tablet Take 1-2 tablets (2-4 mg total) by mouth 4 (four) times daily as needed for diarrhea or loose stools. 60 tablet 1  . loratadine (CLARITIN) 10 MG tablet Take 1 tablet (10 mg total) by mouth daily as needed for allergies. 30 tablet 3  . meclizine (ANTIVERT) 12.5 MG tablet Take 1 tablet (12.5 mg total) by mouth 3 (three) times daily as needed for dizziness or nausea. 60 tablet 2  . oxybutynin (DITROPAN) 5 MG tablet Take 1 tablet (5 mg total) by mouth every 8 (eight) hours as needed for bladder spasms (incontinence). 90 tablet 3  . triamcinolone ointment (KENALOG) 0.5 % Apply 1 application topically 2 (two) times daily. 60 g 3  . zoledronic acid (RECLAST) 5 MG/100ML SOLN Inject 100 mLs (5 mg total) into the vein once. q 12 mo started in 2012 per gyn 100 mL 0   No facility-administered medications prior to visit.     ROS: Review of Systems  Constitutional: Negative for activity change, appetite change, chills, fatigue and unexpected weight change.  HENT: Negative for congestion, mouth sores and sinus pressure.   Eyes: Negative for visual disturbance.  Respiratory: Negative for cough and chest tightness.   Gastrointestinal: Positive for diarrhea. Negative for  abdominal pain, nausea and vomiting.  Genitourinary: Negative for difficulty urinating, frequency and vaginal pain.  Musculoskeletal: Positive for back pain. Negative for gait problem.  Skin: Negative for pallor and rash.  Neurological: Negative for dizziness, tremors, weakness, numbness and headaches.  Psychiatric/Behavioral: Positive for sleep disturbance. Negative for confusion.    Objective:  BP 124/76 (BP Location: Left Arm, Patient Position: Sitting, Cuff Size: Normal)   Pulse 66   Temp 98.6 F (37 C) (Oral)   Ht 4\' 7"  (1.397 m)   Wt 122 lb (55.3 kg)   SpO2 97%   BMI 28.36 kg/m   BP Readings from Last 3 Encounters:  05/09/18 124/76  02/05/18 126/82  12/18/17 124/78    Wt Readings from Last 3 Encounters:  05/09/18 122 lb (55.3 kg)  02/05/18 125 lb (56.7 kg)  12/18/17 128 lb (58.1 kg)    Physical Exam  Constitutional: She appears well-developed. No distress.  HENT:  Head: Normocephalic.  Right Ear: External ear normal.  Left Ear: External ear normal.  Nose: Nose normal.  Mouth/Throat: Oropharynx is clear and moist.  Eyes: Pupils are equal, round, and reactive to light. Conjunctivae are normal. Right eye exhibits no discharge. Left eye exhibits no discharge.  Neck: Normal range of motion. Neck supple. No JVD present. No tracheal deviation present. No thyromegaly present.  Cardiovascular: Normal rate, regular rhythm and normal heart sounds.  Pulmonary/Chest: No stridor. No respiratory distress. She has no wheezes.  Abdominal:  Soft. Bowel sounds are normal. She exhibits no distension and no mass. There is no tenderness. There is no rebound and no guarding.  Musculoskeletal: She exhibits no edema or tenderness.  Lymphadenopathy:    She has no cervical adenopathy.  Neurological: She displays normal reflexes. No cranial nerve deficit. She exhibits normal muscle tone. Coordination abnormal.  Skin: No rash noted. No erythema.  Psychiatric: She has a normal mood and  affect. Her behavior is normal. Judgment and thought content normal.  scoliosis  Lab Results  Component Value Date   WBC 5.8 03/04/2017   HGB 12.8 03/04/2017   HCT 38.0 03/04/2017   PLT 202.0 03/04/2017   GLUCOSE 95 02/05/2018   CHOL 245 (H) 03/04/2017   TRIG 126.0 03/04/2017   HDL 57.00 03/04/2017   LDLDIRECT 196.7 12/22/2010   LDLCALC 162 (H) 03/04/2017   ALT 13 03/04/2017   AST 14 03/04/2017   NA 139 02/05/2018   K 4.3 02/05/2018   CL 103 02/05/2018   CREATININE 0.84 02/05/2018   BUN 16 02/05/2018   CO2 29 02/05/2018   TSH 2.58 02/05/2018   INR 0.96 03/24/2010   HGBA1C 5.7 04/26/2010    Mm Screening Breast Tomo Bilateral  Result Date: 09/18/2017 CLINICAL DATA:  Screening. EXAM: 2D DIGITAL SCREENING BILATERAL MAMMOGRAM WITH CAD AND ADJUNCT TOMO COMPARISON:  Previous exam(s). ACR Breast Density Category c: The breast tissue is heterogeneously dense, which may obscure small masses. FINDINGS: There are no findings suspicious for malignancy. Images were processed with CAD. IMPRESSION: No mammographic evidence of malignancy. A result letter of this screening mammogram will be mailed directly to the patient. RECOMMENDATION: Screening mammogram in one year. (Code:SM-B-01Y) BI-RADS CATEGORY  1: Negative. Electronically Signed   By: Dorise Bullion III M.D   On: 09/18/2017 10:40    Assessment & Plan:   There are no diagnoses linked to this encounter.   No orders of the defined types were placed in this encounter.    Follow-up: No follow-ups on file.  Walker Kehr, MD

## 2018-05-09 NOTE — Assessment & Plan Note (Signed)
Scoliosis

## 2018-05-09 NOTE — Assessment & Plan Note (Signed)
On B12 

## 2018-05-09 NOTE — Assessment & Plan Note (Signed)
Resolved

## 2018-05-09 NOTE — Assessment & Plan Note (Signed)
On Vit D 

## 2018-05-09 NOTE — Assessment & Plan Note (Signed)
No change 

## 2018-06-18 DIAGNOSIS — I708 Atherosclerosis of other arteries: Secondary | ICD-10-CM | POA: Diagnosis not present

## 2018-06-18 DIAGNOSIS — H35373 Puckering of macula, bilateral: Secondary | ICD-10-CM | POA: Diagnosis not present

## 2018-06-18 DIAGNOSIS — H35363 Drusen (degenerative) of macula, bilateral: Secondary | ICD-10-CM | POA: Diagnosis not present

## 2018-06-18 DIAGNOSIS — H40023 Open angle with borderline findings, high risk, bilateral: Secondary | ICD-10-CM | POA: Diagnosis not present

## 2018-06-25 DIAGNOSIS — Z23 Encounter for immunization: Secondary | ICD-10-CM | POA: Diagnosis not present

## 2018-08-28 ENCOUNTER — Other Ambulatory Visit: Payer: Self-pay | Admitting: Internal Medicine

## 2018-08-28 DIAGNOSIS — Z1231 Encounter for screening mammogram for malignant neoplasm of breast: Secondary | ICD-10-CM

## 2018-10-08 ENCOUNTER — Ambulatory Visit
Admission: RE | Admit: 2018-10-08 | Discharge: 2018-10-08 | Disposition: A | Discharge status: Home / Self Care | Payer: Medicare Other | Source: Ambulatory Visit | Attending: Internal Medicine | Admitting: Internal Medicine

## 2018-10-08 DIAGNOSIS — Z1231 Encounter for screening mammogram for malignant neoplasm of breast: Secondary | ICD-10-CM | POA: Diagnosis not present

## 2018-11-11 ENCOUNTER — Encounter: Payer: Self-pay | Admitting: Internal Medicine

## 2018-11-11 ENCOUNTER — Other Ambulatory Visit (INDEPENDENT_AMBULATORY_CARE_PROVIDER_SITE_OTHER): Payer: Medicare Other

## 2018-11-11 ENCOUNTER — Ambulatory Visit (INDEPENDENT_AMBULATORY_CARE_PROVIDER_SITE_OTHER): Payer: Medicare Other | Admitting: Internal Medicine

## 2018-11-11 VITALS — BP 126/70 | HR 61 | Temp 98.1°F | Ht <= 58 in | Wt 124.0 lb

## 2018-11-11 DIAGNOSIS — N183 Chronic kidney disease, stage 3 unspecified: Secondary | ICD-10-CM

## 2018-11-11 DIAGNOSIS — M25511 Pain in right shoulder: Secondary | ICD-10-CM

## 2018-11-11 DIAGNOSIS — E785 Hyperlipidemia, unspecified: Secondary | ICD-10-CM | POA: Diagnosis not present

## 2018-11-11 DIAGNOSIS — G8929 Other chronic pain: Secondary | ICD-10-CM

## 2018-11-11 LAB — CBC WITH DIFFERENTIAL/PLATELET
Basophils Absolute: 0 10*3/uL (ref 0.0–0.1)
Basophils Relative: 0.6 % (ref 0.0–3.0)
EOS ABS: 0.1 10*3/uL (ref 0.0–0.7)
Eosinophils Relative: 1.5 % (ref 0.0–5.0)
HCT: 35.6 % — ABNORMAL LOW (ref 36.0–46.0)
HEMOGLOBIN: 12 g/dL (ref 12.0–15.0)
LYMPHS PCT: 40.5 % (ref 12.0–46.0)
Lymphs Abs: 2.1 10*3/uL (ref 0.7–4.0)
MCHC: 33.6 g/dL (ref 30.0–36.0)
MCV: 91.1 fl (ref 78.0–100.0)
Monocytes Absolute: 0.4 10*3/uL (ref 0.1–1.0)
Monocytes Relative: 8.1 % (ref 3.0–12.0)
NEUTROS ABS: 2.5 10*3/uL (ref 1.4–7.7)
NEUTROS PCT: 49.3 % (ref 43.0–77.0)
Platelets: 231 10*3/uL (ref 150.0–400.0)
RBC: 3.92 Mil/uL (ref 3.87–5.11)
RDW: 12.4 % (ref 11.5–15.5)
WBC: 5.1 10*3/uL (ref 4.0–10.5)

## 2018-11-11 LAB — BASIC METABOLIC PANEL
BUN: 17 mg/dL (ref 6–23)
CHLORIDE: 104 meq/L (ref 96–112)
CO2: 30 meq/L (ref 19–32)
CREATININE: 0.93 mg/dL (ref 0.40–1.20)
Calcium: 8.6 mg/dL (ref 8.4–10.5)
GFR: 57.64 mL/min — ABNORMAL LOW (ref >60.00)
Glucose, Bld: 85 mg/dL (ref 70–99)
Potassium: 3.7 mEq/L (ref 3.5–5.1)
Sodium: 140 mEq/L (ref 135–145)

## 2018-11-11 LAB — HEPATIC FUNCTION PANEL
ALK PHOS: 52 U/L (ref 39–117)
ALT: 9 U/L (ref 0–35)
AST: 12 U/L (ref 0–37)
Albumin: 3.9 g/dL (ref 3.5–5.2)
BILIRUBIN DIRECT: 0.1 mg/dL (ref 0.0–0.3)
Total Bilirubin: 0.4 mg/dL (ref 0.2–1.2)
Total Protein: 6.9 g/dL (ref 6.0–8.3)

## 2018-11-11 LAB — LIPID PANEL
Cholesterol: 219 mg/dL — ABNORMAL HIGH (ref 0–200)
HDL: 50.3 mg/dL (ref >39.00)
LDL CALC: 143 mg/dL — AB (ref 0–99)
NonHDL: 168.95
TRIGLYCERIDES: 128 mg/dL (ref 0.0–149.0)
Total CHOL/HDL Ratio: 4
VLDL: 25.6 mg/dL (ref 0.0–40.0)

## 2018-11-11 LAB — URINALYSIS, ROUTINE W REFLEX MICROSCOPIC
BILIRUBIN URINE: NEGATIVE
KETONES UR: NEGATIVE
NITRITE: NEGATIVE
Specific Gravity, Urine: 1.015 (ref 1.000–1.030)
Urine Glucose: NEGATIVE
Urobilinogen, UA: 0.2 (ref 0.0–1.0)
pH: 7.5 (ref 5.0–8.0)

## 2018-11-11 LAB — TSH: TSH: 2.18 u[IU]/mL (ref 0.35–4.50)

## 2018-11-11 NOTE — Assessment & Plan Note (Signed)
A cardiac CT scan for calcium scoring offered 2/20

## 2018-11-11 NOTE — Patient Instructions (Addendum)
Postprocedure instructions :    A Band-Aid should be left on for 12 hours. Injection therapy is not a cure itself. It is used in conjunction with other modalities. You can use nonsteroidal anti-inflammatories like ibuprofen , hot and cold compresses. Rest is recommended in the next 24 hours. You need to report immediately  if fever, chills or any signs of infection develop.   Cardiac CT calcium scoring test $150   Computed tomography, more commonly known as a CT or CAT scan, is a diagnostic medical imaging test. Like traditional x-rays, it produces multiple images or pictures of the inside of the body. The cross-sectional images generated during a CT scan can be reformatted in multiple planes. They can even generate three-dimensional images. These images can be viewed on a computer monitor, printed on film or by a 3D printer, or transferred to a CD or DVD. CT images of internal organs, bones, soft tissue and blood vessels provide greater detail than traditional x-rays, particularly of soft tissues and blood vessels. A cardiac CT scan for coronary calcium is a non-invasive way of obtaining information about the presence, location and extent of calcified plaque in the coronary arteries-the vessels that supply oxygen-containing blood to the heart muscle. Calcified plaque results when there is a build-up of fat and other substances under the inner layer of the artery. This material can calcify which signals the presence of atherosclerosis, a disease of the vessel wall, also called coronary artery disease (CAD). People with this disease have an increased risk for heart attacks. In addition, over time, progression of plaque build up (CAD) can narrow the arteries or even close off blood flow to the heart. The result may be chest pain, sometimes called "angina," or a heart attack. Because calcium is a marker of CAD, the amount of calcium detected on a cardiac CT scan is a helpful prognostic tool. The findings  on cardiac CT are expressed as a calcium score. Another name for this test is coronary artery calcium scoring.  What are some common uses of the procedure? The goal of cardiac CT scan for calcium scoring is to determine if CAD is present and to what extent, even if there are no symptoms. It is a screening study that may be recommended by a physician for patients with risk factors for CAD but no clinical symptoms. The major risk factors for CAD are: . high blood cholesterol levels  . family history of heart attacks  . diabetes  . high blood pressure  . cigarette smoking  . overweight or obese  . physical inactivity   A negative cardiac CT scan for calcium scoring shows no calcification within the coronary arteries. This suggests that CAD is absent or so minimal it cannot be seen by this technique. The chance of having a heart attack over the next two to five years is very low under these circumstances. A positive test means that CAD is present, regardless of whether or not the patient is experiencing any symptoms. The amount of calcification-expressed as the calcium score-may help to predict the likelihood of a myocardial infarction (heart attack) in the coming years and helps your medical doctor or cardiologist decide whether the patient may need to take preventive medicine or undertake other measures such as diet and exercise to lower the risk for heart attack. The extent of CAD is graded according to your calcium score:  Calcium Score  Presence of CAD  0 No evidence of CAD   1-10 Minimal evidence of  CAD  11-100 Mild evidence of CAD  101-400 Moderate evidence of CAD  Over 400 Extensive evidence of CAD

## 2018-11-11 NOTE — Progress Notes (Signed)
Subjective:  Patient ID: Joyce Riley, female    DOB: Nov 22, 1933  Age: 85 y.o. MRN: [id number]  CC: No chief complaint on file.   HPI Joyce Riley presents for R shoulder pain is worse. F/u B12 def, GERD, osteoporosis  Outpatient Medications Prior to Visit  Medication Sig Dispense Refill  . calcium-vitamin D (OSCAL WITH D) 500-200 MG-UNIT tablet Take 1 tablet by mouth 2 (two) times daily. 100 tablet 3  . Cholecalciferol 1000 UNITS tablet Take 1 tablet (1,000 Units total) by mouth daily. 100 tablet 3  . cyanocobalamin 500 MCG tablet Take 2 tablets (1,000 mcg total) by mouth daily. 100 tablet 3  . Dexlansoprazole (DEXILANT) 30 MG capsule Take 1 capsule (30 mg total) by mouth daily. 90 capsule 3  . loperamide (IMODIUM A-D) 2 MG tablet Take 1-2 tablets (2-4 mg total) by mouth 4 (four) times daily as needed for diarrhea or loose stools. 60 tablet 1  . loratadine (CLARITIN) 10 MG tablet Take 1 tablet (10 mg total) by mouth daily as needed for allergies. 30 tablet 3  . meclizine (ANTIVERT) 12.5 MG tablet Take 1 tablet (12.5 mg total) by mouth 3 (three) times daily as needed for dizziness or nausea. 60 tablet 2  . oxybutynin (DITROPAN) 5 MG tablet Take 1 tablet (5 mg total) by mouth every 8 (eight) hours as needed for bladder spasms (incontinence). 90 tablet 3  . triamcinolone ointment (KENALOG) 0.5 % Apply 1 application topically 2 (two) times daily. 60 g 3  . zoledronic acid (RECLAST) 5 MG/100ML SOLN Inject 100 mLs (5 mg total) into the vein once. q 12 mo started in 2012 per gyn (Patient not taking: Reported on 11/11/2018) 100 mL 0   No facility-administered medications prior to visit.     ROS: Review of Systems  Constitutional: Negative for activity change, appetite change, chills, fatigue and unexpected weight change.  HENT: Negative for congestion, mouth sores and sinus pressure.   Eyes: Negative for visual disturbance.  Respiratory: Negative for cough and chest tightness.     Gastrointestinal: Negative for abdominal pain and nausea.  Genitourinary: Positive for urgency. Negative for difficulty urinating, frequency and vaginal pain.  Musculoskeletal: Positive for arthralgias. Negative for back pain and gait problem.  Skin: Negative for pallor and rash.  Neurological: Negative for dizziness, tremors, weakness, numbness and headaches.  Psychiatric/Behavioral: Negative for confusion, sleep disturbance and suicidal ideas.    Objective:  BP 126/70 (BP Location: Left Arm, Patient Position: Sitting, Cuff Size: Normal)   Pulse 61   Temp 98.1 F (36.7 C) (Oral)   Ht 4\' 7"  (1.397 m)   Wt 124 lb (56.2 kg)   SpO2 97%   BMI 28.82 kg/m   BP Readings from Last 3 Encounters:  11/11/18 126/70  05/09/18 124/76  02/05/18 126/82    Wt Readings from Last 3 Encounters:  11/11/18 124 lb (56.2 kg)  05/09/18 122 lb (55.3 kg)  02/05/18 125 lb (56.7 kg)    Physical Exam Constitutional:      General: She is not in acute distress.    Appearance: She is well-developed.  HENT:     Head: Normocephalic.     Right Ear: External ear normal.     Left Ear: External ear normal.     Nose: Nose normal.  Eyes:     General:        Right eye: No discharge.        Left eye: No discharge.     Conjunctiva/sclera: Conjunctivae  normal.     Pupils: Pupils are equal, round, and reactive to light.  Neck:     Musculoskeletal: Normal range of motion and neck supple.     Thyroid: No thyromegaly.     Vascular: No JVD.     Trachea: No tracheal deviation.  Cardiovascular:     Rate and Rhythm: Normal rate and regular rhythm.     Heart sounds: Normal heart sounds.  Pulmonary:     Effort: No respiratory distress.     Breath sounds: No stridor. No wheezing.  Abdominal:     General: Bowel sounds are normal. There is no distension.     Palpations: Abdomen is soft. There is no mass.     Tenderness: There is no abdominal tenderness. There is no guarding or rebound.  Musculoskeletal:         General: Tenderness present.  Lymphadenopathy:     Cervical: No cervical adenopathy.  Skin:    Findings: No erythema or rash.  Neurological:     Cranial Nerves: No cranial nerve deficit.     Motor: No abnormal muscle tone.     Coordination: Coordination normal.     Deep Tendon Reflexes: Reflexes normal.  Psychiatric:        Behavior: Behavior normal.        Thought Content: Thought content normal.        Judgment: Judgment normal.    R shoulder - pain w/ROM and over biceps tendon   Procedure :Joint Injection, R  shoulder   Indication:  Subacromial bursitis with refractory  chronic pain.   Risks including unsuccessful procedure , bleeding, infection, bruising, skin atrophy, "steroid flare-up" and others were explained to the patient in detail as well as the benefits. Informed consent was obtained and signed.   Tthe patient was placed in a comfortable position. Lateral approach was used. Skin was prepped with Betadine and alcohol  and anesthetized with a cooling spray. Then, a 5 cc syringe with a 2 inch long 24-gauge needle was used for a joint injection.. The needle was advanced  Into the subacromial space.The bursa was injected with 3 mL of 2% lidocaine and 40 mg of Depo-Medrol .  Band-Aid was applied.   Tolerated well. Complications: None. Good pain relief following the procedure.    Procedure :  Biceps tendon injection R   Indication:  Bicipital tendonitis with refractory  chronic pain.   Risks including unsuccessful procedure , bleeding, infection, bruising, skin atrophy and others were explained to the patient in detail as well as the benefits. Informed consent was obtained and signed.   Tthe patient was placed in a comfortable position. Skin was prepped with Betadine and alcohol  and anesthetized with a cooling spray. Then, a 5 cc syringe with a 1.5 inch long 25-gauge needle was used for a tendon injection with 2 mL of 2% lidocaine and 10 mg of Depo-Medrol .  Band-Aid was  applied.     Tolerated well. Complications: None. Good pain relief following the procedure.   Postprocedure instructions :    A Band-Aid should be left on for 12 hours. Injection therapy is not a cure itself. It is used in conjunction with other modalities. You can use nonsteroidal anti-inflammatories like ibuprofen , hot and cold compresses. Rest is recommended in the next 24 hours. You need to report immediately  if fever, chills or any signs of infection develop.         Lab Results  Component Value Date  WBC 5.8 03/04/2017   HGB 12.8 03/04/2017   HCT 38.0 03/04/2017   PLT 202.0 03/04/2017   GLUCOSE 93 05/09/2018   CHOL 245 (H) 03/04/2017   TRIG 126.0 03/04/2017   HDL 57.00 03/04/2017   LDLDIRECT 196.7 12/22/2010   LDLCALC 162 (H) 03/04/2017   ALT 13 03/04/2017   AST 14 03/04/2017   NA 142 05/09/2018   K 4.2 05/09/2018   CL 105 05/09/2018   CREATININE 1.02 05/09/2018   BUN 18 05/09/2018   CO2 32 05/09/2018   TSH 2.58 02/05/2018   INR 0.96 03/24/2010   HGBA1C 5.7 04/26/2010    Mm 3d Screen Breast Bilateral  Result Date: 10/08/2018 CLINICAL DATA:  Screening. EXAM: DIGITAL SCREENING BILATERAL MAMMOGRAM WITH TOMO AND CAD COMPARISON:  Previous exam(s). ACR Breast Density Category c: The breast tissue is heterogeneously dense, which may obscure small masses. FINDINGS: There are no findings suspicious for malignancy. Images were processed with CAD. IMPRESSION: No mammographic evidence of malignancy. A result letter of this screening mammogram will be mailed directly to the patient. RECOMMENDATION: Screening mammogram in one year. (Code:SM-B-01Y) BI-RADS CATEGORY  1: Negative. Electronically Signed   By: Curlene Dolphin M.D.   On: 10/08/2018 15:25    Assessment & Plan:   There are no diagnoses linked to this encounter.   No orders of the defined types were placed in this encounter.    Follow-up: No follow-ups on file.  Walker Kehr, MD

## 2018-11-11 NOTE — Assessment & Plan Note (Signed)
labs

## 2018-11-11 NOTE — Assessment & Plan Note (Addendum)
R subacr bursitis and biceps tendon tendonitis - worse See procedures

## 2018-12-18 ENCOUNTER — Encounter: Payer: Medicare Other | Admitting: Gynecology

## 2019-02-10 ENCOUNTER — Encounter: Payer: Medicare Other | Admitting: Gynecology

## 2019-03-12 ENCOUNTER — Ambulatory Visit: Payer: Medicare Other | Admitting: Internal Medicine

## 2019-04-06 ENCOUNTER — Other Ambulatory Visit: Payer: Self-pay | Admitting: Internal Medicine

## 2019-04-06 NOTE — Telephone Encounter (Signed)
Medication Refill - Medication: Dexlansoprazole (DEXILANT) 30 MG capsule    Has the patient contacted their pharmacy? Yes (Agent: If no, request that the patient contact the pharmacy for the refill.) (Agent: If yes, when and what did the pharmacy advise?)Contact PCP  Preferred Pharmacy (with phone number or street name): [facility], [place] [facility] [address] [place] [phone] (Phone) [phone] (Fax)     Agent: Please be advised that RX refills may take up to 3 business days. We ask that you follow-up with your pharmacy.

## 2019-04-08 DIAGNOSIS — H40023 Open angle with borderline findings, high risk, bilateral: Secondary | ICD-10-CM | POA: Diagnosis not present

## 2019-04-08 DIAGNOSIS — H04123 Dry eye syndrome of bilateral lacrimal glands: Secondary | ICD-10-CM | POA: Diagnosis not present

## 2019-04-08 DIAGNOSIS — H5709 Other anomalies of pupillary function: Secondary | ICD-10-CM | POA: Diagnosis not present

## 2019-04-08 DIAGNOSIS — Q130 Coloboma of iris: Secondary | ICD-10-CM | POA: Diagnosis not present

## 2019-04-09 MED ORDER — DEXILANT 30 MG PO CPDR: 1.0000 | DELAYED_RELEASE_CAPSULE | Freq: Every day | ORAL | 3 refills | Status: DC

## 2019-04-14 ENCOUNTER — Other Ambulatory Visit: Payer: Self-pay

## 2019-04-15 ENCOUNTER — Ambulatory Visit (INDEPENDENT_AMBULATORY_CARE_PROVIDER_SITE_OTHER): Payer: Medicare Other | Admitting: Gynecology

## 2019-04-15 ENCOUNTER — Encounter: Payer: Self-pay | Admitting: Gynecology

## 2019-04-15 VITALS — BP 124/74 | Ht <= 58 in | Wt 126.0 lb

## 2019-04-15 DIAGNOSIS — N952 Postmenopausal atrophic vaginitis: Secondary | ICD-10-CM

## 2019-04-15 DIAGNOSIS — Z01419 Encounter for gynecological examination (general) (routine) without abnormal findings: Secondary | ICD-10-CM

## 2019-04-15 DIAGNOSIS — M810 Age-related osteoporosis without current pathological fracture: Secondary | ICD-10-CM

## 2019-04-15 DIAGNOSIS — N816 Rectocele: Secondary | ICD-10-CM

## 2019-04-15 NOTE — Progress Notes (Signed)
    Para Cossey 05-07-1934 [id number]        84 y.o.  [id number] for breast and pelvic exam.  Without gynecologic complaints.  [place] provided interpreter throughout entire encounter  Past medical history,surgical history, problem list, medications, allergies, family history and social history were all reviewed and documented as reviewed in the EPIC chart.  ROS:  Performed with pertinent positives and negatives included in the history, assessment and plan.   Additional significant findings : None   Exam: [name] assistant Vitals:   04/15/19 1052  BP: 124/74  Weight: 126 lb (57.2 kg)  Height: 4\' 6"  (1.372 m)   Body mass index is 30.38 kg/m.  General appearance:  Normal affect, orientation and appearance. Skin: Grossly normal HEENT: Without gross lesions.  No cervical or supraclavicular adenopathy. Thyroid normal.  Lungs:  Clear without wheezing, rales or rhonchi Cardiac: RR, without RMG Abdominal:  Soft, nontender, without masses, guarding, rebound, organomegaly or hernia Breasts:  Examined lying and sitting without masses, retractions, discharge or axillary adenopathy. Pelvic:  Ext, BUS, Vagina: With atrophic changes.  First to second-degree rectocele.  No significant cystocele.  Cuff well supported.  Cervix: With atrophic changes  Uterus: Anteverted, normal size, shape and contour, midline and mobile nontender   Adnexa: Without masses or tenderness    Anus and perineum: Normal   Rectovaginal: Normal sphincter tone without palpated masses or tenderness.    Assessment/Plan:  85 y.o. [id number] female for breast and pelvic exam  1. Postmenopausal.  No significant menopausal symptoms or any vaginal bleeding. 2. Mild rectocele.  Stable on serial exams.  Asymptomatic to the patient. 3. Osteoporosis.  Based on distal third of radius.  Worst other score -1.6.  History of Reclast 8 years.  Discontinued last year.  Recommend follow-up DEXA now at 2-year interval. 4.  Colonoscopy 2016.  Colon screening per primary care recommendations. 5. Mammography 09/2018.  Continue with annual mammography when due.  Breast exam normal today. 6. Pap smear 2012.  No Pap smear done today.  No history of abnormal Pap smears.  We both agree to stop screening per current screening guidelines based on age. 7. Health maintenance.  No routine lab work done as patient does this elsewhere.  Follow-up 1 year, sooner as needed.   Anastasio Auerbach MD, 11:16 AM 04/15/2019

## 2019-04-15 NOTE — Patient Instructions (Signed)
Follow-up for the bone density as scheduled. 

## 2019-04-30 ENCOUNTER — Other Ambulatory Visit: Payer: Self-pay | Admitting: Gynecology

## 2019-04-30 ENCOUNTER — Other Ambulatory Visit: Payer: Self-pay

## 2019-04-30 ENCOUNTER — Ambulatory Visit (INDEPENDENT_AMBULATORY_CARE_PROVIDER_SITE_OTHER): Payer: Medicare Other

## 2019-04-30 DIAGNOSIS — M810 Age-related osteoporosis without current pathological fracture: Secondary | ICD-10-CM

## 2019-04-30 DIAGNOSIS — Z780 Asymptomatic menopausal state: Secondary | ICD-10-CM

## 2019-05-01 ENCOUNTER — Encounter: Payer: Self-pay | Admitting: Gynecology

## 2019-05-18 ENCOUNTER — Other Ambulatory Visit (INDEPENDENT_AMBULATORY_CARE_PROVIDER_SITE_OTHER): Payer: Medicare Other

## 2019-05-18 ENCOUNTER — Other Ambulatory Visit: Payer: Self-pay

## 2019-05-18 ENCOUNTER — Ambulatory Visit (INDEPENDENT_AMBULATORY_CARE_PROVIDER_SITE_OTHER): Payer: Medicare Other | Admitting: Internal Medicine

## 2019-05-18 ENCOUNTER — Encounter: Payer: Self-pay | Admitting: Internal Medicine

## 2019-05-18 VITALS — BP 150/80 | HR 67 | Temp 98.2°F | Ht <= 58 in | Wt 122.0 lb

## 2019-05-18 DIAGNOSIS — Z23 Encounter for immunization: Secondary | ICD-10-CM

## 2019-05-18 DIAGNOSIS — E559 Vitamin D deficiency, unspecified: Secondary | ICD-10-CM | POA: Diagnosis not present

## 2019-05-18 DIAGNOSIS — N183 Chronic kidney disease, stage 3 unspecified: Secondary | ICD-10-CM

## 2019-05-18 DIAGNOSIS — K219 Gastro-esophageal reflux disease without esophagitis: Secondary | ICD-10-CM | POA: Diagnosis not present

## 2019-05-18 DIAGNOSIS — R079 Chest pain, unspecified: Secondary | ICD-10-CM

## 2019-05-18 DIAGNOSIS — M810 Age-related osteoporosis without current pathological fracture: Secondary | ICD-10-CM

## 2019-05-18 DIAGNOSIS — E785 Hyperlipidemia, unspecified: Secondary | ICD-10-CM

## 2019-05-18 DIAGNOSIS — R0789 Other chest pain: Secondary | ICD-10-CM

## 2019-05-18 DIAGNOSIS — M542 Cervicalgia: Secondary | ICD-10-CM | POA: Diagnosis not present

## 2019-05-18 DIAGNOSIS — E538 Deficiency of other specified B group vitamins: Secondary | ICD-10-CM | POA: Diagnosis not present

## 2019-05-18 LAB — BASIC METABOLIC PANEL
BUN: 9 mg/dL (ref 6–23)
CO2: 27 mEq/L (ref 19–32)
Calcium: 8.7 mg/dL (ref 8.4–10.5)
Chloride: 105 mEq/L (ref 96–112)
Creatinine, Ser: 0.97 mg/dL (ref 0.40–1.20)
GFR: 54.84 mL/min — ABNORMAL LOW (ref >60.00)
Glucose, Bld: 94 mg/dL (ref 70–99)
Potassium: 3.8 mEq/L (ref 3.5–5.1)
Sodium: 140 mEq/L (ref 135–145)

## 2019-05-18 LAB — HEPATIC FUNCTION PANEL
ALT: 10 U/L (ref 0–35)
AST: 15 U/L (ref 0–37)
Albumin: 4.2 g/dL (ref 3.5–5.2)
Alkaline Phosphatase: 53 U/L (ref 39–117)
Bilirubin, Direct: 0.1 mg/dL (ref 0.0–0.3)
Total Bilirubin: 0.6 mg/dL (ref 0.2–1.2)
Total Protein: 7 g/dL (ref 6.0–8.3)

## 2019-05-18 MED ORDER — MAGNESIUM 250 MG PO TABS: 250.0000 mg | ORAL_TABLET | Freq: Every day | ORAL | 3 refills | Status: AC

## 2019-05-18 NOTE — Assessment & Plan Note (Signed)
On Dexilant 

## 2019-05-18 NOTE — Assessment & Plan Note (Signed)
Labs

## 2019-05-18 NOTE — Assessment & Plan Note (Signed)
Statin intolerant 

## 2019-05-18 NOTE — Progress Notes (Signed)
Subjective:  Patient ID: Joyce Riley, female    DOB: Aug 14, 1934  Age: 85 y.o. MRN: [id number]  CC: No chief complaint on file.   HPI Joyce Riley presents for GERD, B12 def, OAB f/u C/o L neck/CP 1-2 weeks ago x2-3 seconds  Outpatient Medications Prior to Visit  Medication Sig Dispense Refill  . calcium-vitamin D (OSCAL WITH D) 500-200 MG-UNIT tablet Take 1 tablet by mouth 2 (two) times daily. 100 tablet 3  . Cholecalciferol 1000 UNITS tablet Take 1 tablet (1,000 Units total) by mouth daily. 100 tablet 3  . cyanocobalamin 500 MCG tablet Take 2 tablets (1,000 mcg total) by mouth daily. 100 tablet 3  . Dexlansoprazole (DEXILANT) 30 MG capsule Take 1 capsule (30 mg total) by mouth daily. 90 capsule 3  . loperamide (IMODIUM A-D) 2 MG tablet Take 1-2 tablets (2-4 mg total) by mouth 4 (four) times daily as needed for diarrhea or loose stools. 60 tablet 1  . loratadine (CLARITIN) 10 MG tablet Take 1 tablet (10 mg total) by mouth daily as needed for allergies. 30 tablet 3  . meclizine (ANTIVERT) 12.5 MG tablet Take 1 tablet (12.5 mg total) by mouth 3 (three) times daily as needed for dizziness or nausea. 60 tablet 2  . oxybutynin (DITROPAN) 5 MG tablet Take 1 tablet (5 mg total) by mouth every 8 (eight) hours as needed for bladder spasms (incontinence). 90 tablet 3  . triamcinolone ointment (KENALOG) 0.5 % Apply 1 application topically 2 (two) times daily. 60 g 3   No facility-administered medications prior to visit.     ROS: Review of Systems  Constitutional: Negative for activity change, appetite change, chills, fatigue and unexpected weight change.  HENT: Negative for congestion, mouth sores and sinus pressure.   Eyes: Negative for visual disturbance.  Respiratory: Negative for cough and chest tightness.   Gastrointestinal: Negative for abdominal pain and nausea.  Genitourinary: Negative for difficulty urinating, frequency and vaginal pain.  Musculoskeletal: Positive for  arthralgias. Negative for back pain and gait problem.  Skin: Negative for pallor and rash.  Neurological: Negative for dizziness, tremors, weakness, numbness and headaches.  Psychiatric/Behavioral: Negative for confusion, sleep disturbance and suicidal ideas.    Objective:  BP (!) 150/80 (BP Location: Left Arm, Patient Position: Sitting, Cuff Size: Normal)   Pulse 67   Temp 98.2 F (36.8 C) (Oral)   Ht 4\' 6"  (1.372 m)   Wt 122 lb (55.3 kg)   SpO2 97%   BMI 29.42 kg/m   BP Readings from Last 3 Encounters:  05/18/19 (!) 150/80  04/15/19 124/74  11/11/18 126/70    Wt Readings from Last 3 Encounters:  05/18/19 122 lb (55.3 kg)  04/15/19 126 lb (57.2 kg)  11/11/18 124 lb (56.2 kg)    Physical Exam Constitutional:      General: She is not in acute distress.    Appearance: She is well-developed.  HENT:     Head: Normocephalic.     Right Ear: External ear normal.     Left Ear: External ear normal.     Nose: Nose normal.  Eyes:     General:        Right eye: No discharge.        Left eye: No discharge.     Conjunctiva/sclera: Conjunctivae normal.     Pupils: Pupils are equal, round, and reactive to light.  Neck:     Musculoskeletal: Normal range of motion and neck supple.     Thyroid: No  thyromegaly.     Vascular: No JVD.     Trachea: No tracheal deviation.  Cardiovascular:     Rate and Rhythm: Normal rate and regular rhythm.     Heart sounds: Normal heart sounds.  Pulmonary:     Effort: No respiratory distress.     Breath sounds: No stridor. No wheezing.  Abdominal:     General: Bowel sounds are normal. There is no distension.     Palpations: Abdomen is soft. There is no mass.     Tenderness: There is no abdominal tenderness. There is no guarding or rebound.  Musculoskeletal:        General: No tenderness.  Lymphadenopathy:     Cervical: No cervical adenopathy.  Skin:    Findings: No erythema or rash.  Neurological:     Cranial Nerves: No cranial nerve  deficit.     Motor: No abnormal muscle tone.     Coordination: Coordination normal.     Deep Tendon Reflexes: Reflexes normal.  Psychiatric:        Behavior: Behavior normal.        Thought Content: Thought content normal.        Judgment: Judgment normal.   Procedure: EKG Indication: atypical chest pain/ L neck pain Impression: NSR. No acute changes.   Lab Results  Component Value Date   WBC 5.1 11/11/2018   HGB 12.0 11/11/2018   HCT 35.6 (L) 11/11/2018   PLT 231.0 11/11/2018   GLUCOSE 85 11/11/2018   CHOL 219 (H) 11/11/2018   TRIG 128.0 11/11/2018   HDL 50.30 11/11/2018   LDLDIRECT 196.7 12/22/2010   LDLCALC 143 (H) 11/11/2018   ALT 9 11/11/2018   AST 12 11/11/2018   NA 140 11/11/2018   K 3.7 11/11/2018   CL 104 11/11/2018   CREATININE 0.93 11/11/2018   BUN 17 11/11/2018   CO2 30 11/11/2018   TSH 2.18 11/11/2018   INR 0.96 03/24/2010   HGBA1C 5.7 04/26/2010    Mm 3d Screen Breast Bilateral  Result Date: 10/08/2018 CLINICAL DATA:  Screening. EXAM: DIGITAL SCREENING BILATERAL MAMMOGRAM WITH TOMO AND CAD COMPARISON:  Previous exam(s). ACR Breast Density Category c: The breast tissue is heterogeneously dense, which may obscure small masses. FINDINGS: There are no findings suspicious for malignancy. Images were processed with CAD. IMPRESSION: No mammographic evidence of malignancy. A result letter of this screening mammogram will be mailed directly to the patient. RECOMMENDATION: Screening mammogram in one year. (Code:SM-B-01Y) BI-RADS CATEGORY  1: Negative. Electronically Signed   By: Curlene Dolphin M.D.   On: 10/08/2018 15:25    Assessment & Plan:   There are no diagnoses linked to this encounter.   No orders of the defined types were placed in this encounter.    Follow-up: No follow-ups on file.  Walker Kehr, MD

## 2019-05-18 NOTE — Assessment & Plan Note (Addendum)
Labs Vit D and calcium

## 2019-05-18 NOTE — Assessment & Plan Note (Signed)
On B12 

## 2019-05-18 NOTE — Assessment & Plan Note (Signed)
L neck/CP 1-2 weeks ago x2-3 seconds - ?pinched nerve  EKG

## 2019-05-18 NOTE — Assessment & Plan Note (Signed)
Vit D 

## 2019-05-19 LAB — VITAMIN D 25 HYDROXY (VIT D DEFICIENCY, FRACTURES): VITD: 41.83 ng/mL (ref 30.00–100.00)

## 2019-05-22 ENCOUNTER — Encounter: Payer: Self-pay | Admitting: *Deleted

## 2019-05-27 DIAGNOSIS — H903 Sensorineural hearing loss, bilateral: Secondary | ICD-10-CM | POA: Diagnosis not present

## 2019-06-19 ENCOUNTER — Telehealth: Payer: Self-pay

## 2019-06-19 NOTE — Telephone Encounter (Signed)
Copied from [place] [id number]. Topic: General - Other >> Jun 19, 2019  2:26 PM Celene Kras A wrote: Reason for CRM: Pts daughter called stating the pt is needing a form completed to request home health. Please advise.

## 2019-06-20 NOTE — Telephone Encounter (Signed)
I have not seen one yet.  Thanks,

## 2019-06-26 NOTE — Telephone Encounter (Signed)
Pt's daughter called in to follow up on request for home health services. Advised per PCP response. She said that she is not sure if there is a form. She is requesting home health services for pt, she said that pt need assistance in the home. Someone to help assist with showering, etc.

## 2019-06-29 NOTE — Telephone Encounter (Signed)
Okay to place referral for home health

## 2019-07-01 DIAGNOSIS — H35373 Puckering of macula, bilateral: Secondary | ICD-10-CM | POA: Diagnosis not present

## 2019-07-01 DIAGNOSIS — H35013 Changes in retinal vascular appearance, bilateral: Secondary | ICD-10-CM | POA: Diagnosis not present

## 2019-07-01 DIAGNOSIS — H35363 Drusen (degenerative) of macula, bilateral: Secondary | ICD-10-CM | POA: Diagnosis not present

## 2019-07-01 DIAGNOSIS — H40023 Open angle with borderline findings, high risk, bilateral: Secondary | ICD-10-CM | POA: Diagnosis not present

## 2019-07-01 NOTE — Telephone Encounter (Signed)
yes

## 2019-07-01 NOTE — Telephone Encounter (Signed)
Is it [facility]? Thx

## 2019-07-02 ENCOUNTER — Encounter: Payer: Self-pay | Admitting: Gynecology

## 2019-07-09 NOTE — Telephone Encounter (Signed)
PCS filled out Tx

## 2019-08-19 ENCOUNTER — Ambulatory Visit: Payer: Medicare Other | Admitting: Internal Medicine

## 2019-09-08 ENCOUNTER — Ambulatory Visit (INDEPENDENT_AMBULATORY_CARE_PROVIDER_SITE_OTHER): Payer: Medicare Other | Admitting: Internal Medicine

## 2019-09-08 ENCOUNTER — Other Ambulatory Visit: Payer: Self-pay

## 2019-09-08 ENCOUNTER — Encounter: Payer: Self-pay | Admitting: Internal Medicine

## 2019-09-08 DIAGNOSIS — M79644 Pain in right finger(s): Secondary | ICD-10-CM

## 2019-09-08 DIAGNOSIS — L84 Corns and callosities: Secondary | ICD-10-CM | POA: Diagnosis not present

## 2019-09-08 DIAGNOSIS — K219 Gastro-esophageal reflux disease without esophagitis: Secondary | ICD-10-CM | POA: Diagnosis not present

## 2019-09-08 DIAGNOSIS — E538 Deficiency of other specified B group vitamins: Secondary | ICD-10-CM | POA: Diagnosis not present

## 2019-09-08 DIAGNOSIS — E559 Vitamin D deficiency, unspecified: Secondary | ICD-10-CM

## 2019-09-08 NOTE — Assessment & Plan Note (Signed)
R 1 st MCP OA See Procedure

## 2019-09-08 NOTE — Assessment & Plan Note (Signed)
Vit D 

## 2019-09-08 NOTE — Assessment & Plan Note (Signed)
On B12 

## 2019-09-08 NOTE — Progress Notes (Signed)
Subjective:  Patient ID: Joyce Riley, female    DOB: 07-May-1934  Age: 85 y.o. MRN: [id number]  CC: No chief complaint on file.   HPI Joyce Riley presents for lesions on feet/soles C/o R thumb pain F/u GERD   Outpatient Medications Prior to Visit  Medication Sig Dispense Refill  . calcium-vitamin D (OSCAL WITH D) 500-200 MG-UNIT tablet Take 1 tablet by mouth 2 (two) times daily. 100 tablet 3  . Cholecalciferol 1000 UNITS tablet Take 1 tablet (1,000 Units total) by mouth daily. 100 tablet 3  . cyanocobalamin 500 MCG tablet Take 2 tablets (1,000 mcg total) by mouth daily. 100 tablet 3  . Dexlansoprazole (DEXILANT) 30 MG capsule Take 1 capsule (30 mg total) by mouth daily. 90 capsule 3  . loperamide (IMODIUM A-D) 2 MG tablet Take 1-2 tablets (2-4 mg total) by mouth 4 (four) times daily as needed for diarrhea or loose stools. 60 tablet 1  . loratadine (CLARITIN) 10 MG tablet Take 1 tablet (10 mg total) by mouth daily as needed for allergies. 30 tablet 3  . Magnesium 250 MG TABS Take 1 tablet (250 mg total) by mouth daily. 100 tablet 3  . meclizine (ANTIVERT) 12.5 MG tablet Take 1 tablet (12.5 mg total) by mouth 3 (three) times daily as needed for dizziness or nausea. 60 tablet 2  . oxybutynin (DITROPAN) 5 MG tablet Take 1 tablet (5 mg total) by mouth every 8 (eight) hours as needed for bladder spasms (incontinence). 90 tablet 3  . triamcinolone ointment (KENALOG) 0.5 % Apply 1 application topically 2 (two) times daily. 60 g 3   No facility-administered medications prior to visit.    ROS: Review of Systems  Constitutional: Negative for activity change, appetite change, chills, fatigue and unexpected weight change.  HENT: Negative for congestion, mouth sores and sinus pressure.   Eyes: Negative for visual disturbance.  Respiratory: Negative for cough and chest tightness.   Gastrointestinal: Negative for abdominal pain and nausea.  Genitourinary: Negative for difficulty urinating,  frequency and vaginal pain.  Musculoskeletal: Positive for arthralgias and gait problem. Negative for back pain.  Skin: Negative for pallor and rash.  Neurological: Negative for dizziness, tremors, weakness, numbness and headaches.  Psychiatric/Behavioral: Negative for confusion and sleep disturbance.    Objective:  BP 138/72 (BP Location: Left Arm, Patient Position: Sitting, Cuff Size: Normal)   Pulse 70   Temp 98.2 F (36.8 C) (Oral)   Ht 4\' 6"  (1.372 m)   Wt 120 lb (54.4 kg)   SpO2 98%   BMI 28.93 kg/m   BP Readings from Last 3 Encounters:  09/08/19 138/72  05/18/19 (!) 150/80  04/15/19 124/74    Wt Readings from Last 3 Encounters:  09/08/19 120 lb (54.4 kg)  05/18/19 122 lb (55.3 kg)  04/15/19 126 lb (57.2 kg)    Physical Exam Constitutional:      General: She is not in acute distress.    Appearance: She is well-developed.  HENT:     Head: Normocephalic.     Right Ear: External ear normal.     Left Ear: External ear normal.     Nose: Nose normal.  Eyes:     General:        Right eye: No discharge.        Left eye: No discharge.     Conjunctiva/sclera: Conjunctivae normal.     Pupils: Pupils are equal, round, and reactive to light.  Neck:     Thyroid: No thyromegaly.  Vascular: No JVD.     Trachea: No tracheal deviation.  Cardiovascular:     Rate and Rhythm: Normal rate and regular rhythm.     Heart sounds: Normal heart sounds.  Pulmonary:     Effort: No respiratory distress.     Breath sounds: No stridor. No wheezing.  Abdominal:     General: Bowel sounds are normal. There is no distension.     Palpations: Abdomen is soft. There is no mass.     Tenderness: There is no abdominal tenderness. There is no guarding or rebound.  Musculoskeletal:        General: Tenderness present.     Cervical back: Normal range of motion and neck supple.  Lymphadenopathy:     Cervical: No cervical adenopathy.  Skin:    Findings: Lesion present. No erythema or rash.    Neurological:     Mental Status: She is oriented to person, place, and time.     Cranial Nerves: No cranial nerve deficit.     Motor: No abnormal muscle tone.     Coordination: Coordination normal.     Gait: Gait abnormal.     Deep Tendon Reflexes: Reflexes normal.  Psychiatric:        Behavior: Behavior normal.        Thought Content: Thought content normal.        Judgment: Judgment normal.   B   foot callus  1st R MCP tender  1st  MCP joint was marked and  the skin was prepped with Betadine and alcohol. 1 inch 25-gauge needle was used. The needle was advanced  into the joint. It  was injected with 0.5 mL of 2% lidocaine and 10 mg of Depo-Medrol in a usual fashion.  Band-Aid applied.   Procedure: B   foot callus paring/cutting  Indication:  B  foot callus, painful x2 total Consent: verbal  Risks and benefits were explained to the patient. Skin was cleaned with alcohol. I removed a large callus carefully with a round blade. Skin remained intact. Pain is better. Tolerated well. Complications: none. Bandaid applied   Lab Results  Component Value Date   WBC 5.1 11/11/2018   HGB 12.0 11/11/2018   HCT 35.6 (L) 11/11/2018   PLT 231.0 11/11/2018   GLUCOSE 94 05/18/2019   CHOL 219 (H) 11/11/2018   TRIG 128.0 11/11/2018   HDL 50.30 11/11/2018   LDLDIRECT 196.7 12/22/2010   LDLCALC 143 (H) 11/11/2018   ALT 10 05/18/2019   AST 15 05/18/2019   NA 140 05/18/2019   K 3.8 05/18/2019   CL 105 05/18/2019   CREATININE 0.97 05/18/2019   BUN 9 05/18/2019   CO2 27 05/18/2019   TSH 2.18 11/11/2018   INR 0.96 03/24/2010   HGBA1C 5.7 04/26/2010    MM 3D SCREEN BREAST BILATERAL  Result Date: 10/08/2018 CLINICAL DATA:  Screening. EXAM: DIGITAL SCREENING BILATERAL MAMMOGRAM WITH TOMO AND CAD COMPARISON:  Previous exam(s). ACR Breast Density Category c: The breast tissue is heterogeneously dense, which may obscure small masses. FINDINGS: There are no findings suspicious for malignancy.  Images were processed with CAD. IMPRESSION: No mammographic evidence of malignancy. A result letter of this screening mammogram will be mailed directly to the patient. RECOMMENDATION: Screening mammogram in one year. (Code:SM-B-01Y) BI-RADS CATEGORY  1: Negative. Electronically Signed   By: Curlene Dolphin M.D.   On: 10/08/2018 15:25    Assessment & Plan:   There are no diagnoses linked to this encounter.   No  orders of the defined types were placed in this encounter.    Follow-up: No follow-ups on file.  Walker Kehr, MD

## 2019-09-08 NOTE — Assessment & Plan Note (Signed)
See procedure x2

## 2019-09-08 NOTE — Assessment & Plan Note (Signed)
On Dexilant 

## 2019-09-15 ENCOUNTER — Other Ambulatory Visit: Payer: Self-pay | Admitting: Internal Medicine

## 2019-09-15 DIAGNOSIS — Z1231 Encounter for screening mammogram for malignant neoplasm of breast: Secondary | ICD-10-CM

## 2019-10-17 ENCOUNTER — Ambulatory Visit: Payer: Medicare Other | Attending: Internal Medicine

## 2019-10-17 DIAGNOSIS — Z23 Encounter for immunization: Secondary | ICD-10-CM | POA: Insufficient documentation

## 2019-10-17 NOTE — Progress Notes (Signed)
   Covid-19 Vaccination Clinic  Name:  Joyce Riley    MRN: [id number] DOB: [birth year]  10/17/2019  Joyce Riley was observed post Covid-19 immunization for 15 minutes without incidence. She was provided with Vaccine Information Sheet and instruction to access the V-Safe system.   Joyce Riley was instructed to call 911 with any severe reactions post vaccine: Marland Kitchen Difficulty breathing  . Swelling of your face and throat  . A fast heartbeat  . A bad rash all over your body  . Dizziness and weakness    Immunizations Administered    Name Date Dose VIS Date Route   Pfizer COVID-19 Vaccine 10/17/2019 12:08 PM 0.3 mL 09/04/2019 Intramuscular   Manufacturer: [place]   Lot: [id number]   [place]: [id number]

## 2019-11-04 ENCOUNTER — Ambulatory Visit: Payer: Medicare Other

## 2019-11-07 ENCOUNTER — Ambulatory Visit: Payer: Medicare Other | Attending: Internal Medicine

## 2019-11-07 DIAGNOSIS — Z23 Encounter for immunization: Secondary | ICD-10-CM | POA: Insufficient documentation

## 2019-11-07 NOTE — Progress Notes (Signed)
   Covid-19 Vaccination Clinic  Name:  Joyce Riley    MRN: [id number] DOB: 10-20-1933  11/07/2019  [name] was observed post Covid-19 immunization for 15 minutes without incidence. She was provided with Vaccine Information Sheet and instruction to access the V-Safe system.   [name] was instructed to call 911 with any severe reactions post vaccine: Marland Kitchen Difficulty breathing  . Swelling of your face and throat  . A fast heartbeat  . A bad rash all over your body  . Dizziness and weakness    Immunizations Administered    Name Date Dose VIS Date Route   Pfizer COVID-19 Vaccine 11/07/2019 11:09 AM 0.3 mL 09/04/2019 Intramuscular   Manufacturer: [place]   Lot: [id number]   [place]: [id number]

## 2019-12-09 ENCOUNTER — Other Ambulatory Visit: Payer: Self-pay

## 2019-12-09 ENCOUNTER — Encounter: Payer: Self-pay | Admitting: Internal Medicine

## 2019-12-09 ENCOUNTER — Ambulatory Visit (INDEPENDENT_AMBULATORY_CARE_PROVIDER_SITE_OTHER): Payer: Medicare Other | Admitting: Internal Medicine

## 2019-12-09 VITALS — BP 126/72 | HR 65 | Temp 98.4°F | Ht <= 58 in | Wt 114.0 lb

## 2019-12-09 DIAGNOSIS — R634 Abnormal weight loss: Secondary | ICD-10-CM | POA: Diagnosis not present

## 2019-12-09 DIAGNOSIS — E538 Deficiency of other specified B group vitamins: Secondary | ICD-10-CM | POA: Diagnosis not present

## 2019-12-09 DIAGNOSIS — N1831 Chronic kidney disease, stage 3a: Secondary | ICD-10-CM | POA: Diagnosis not present

## 2019-12-09 DIAGNOSIS — K219 Gastro-esophageal reflux disease without esophagitis: Secondary | ICD-10-CM

## 2019-12-09 DIAGNOSIS — E559 Vitamin D deficiency, unspecified: Secondary | ICD-10-CM

## 2019-12-09 LAB — BASIC METABOLIC PANEL
BUN: 13 mg/dL (ref 6–23)
CO2: 30 mEq/L (ref 19–32)
Calcium: 9.1 mg/dL (ref 8.4–10.5)
Chloride: 102 mEq/L (ref 96–112)
Creatinine, Ser: 0.99 mg/dL (ref 0.40–1.20)
GFR: 53.49 mL/min — ABNORMAL LOW (ref >60.00)
Glucose, Bld: 89 mg/dL (ref 70–99)
Potassium: 4.3 mEq/L (ref 3.5–5.1)
Sodium: 139 mEq/L (ref 135–145)

## 2019-12-09 LAB — TSH: TSH: 1.67 u[IU]/mL (ref 0.35–4.50)

## 2019-12-09 LAB — HEPATIC FUNCTION PANEL
ALT: 12 U/L (ref 0–35)
AST: 15 U/L (ref 0–37)
Albumin: 3.9 g/dL (ref 3.5–5.2)
Alkaline Phosphatase: 57 U/L (ref 39–117)
Bilirubin, Direct: 0.1 mg/dL (ref 0.0–0.3)
Total Bilirubin: 0.6 mg/dL (ref 0.2–1.2)
Total Protein: 7 g/dL (ref 6.0–8.3)

## 2019-12-09 LAB — MAGNESIUM: Magnesium: 1.9 mg/dL (ref 1.5–2.5)

## 2019-12-09 MED ORDER — CALCIUM CARBONATE-VITAMIN D 500-200 MG-UNIT PO TABS: 1.0000 | ORAL_TABLET | Freq: Two times a day (BID) | ORAL | 3 refills | Status: AC

## 2019-12-09 NOTE — Patient Instructions (Signed)
Wt Readings from Last 3 Encounters:  12/09/19 114 lb (51.7 kg)  09/08/19 120 lb (54.4 kg)  05/18/19 122 lb (55.3 kg)

## 2019-12-09 NOTE — Assessment & Plan Note (Signed)
Vit D 

## 2019-12-09 NOTE — Progress Notes (Signed)
Subjective:  Patient ID: Joyce Riley, female    DOB: [birth year]  Age: 86 y.o. MRN: [id number]  CC: No chief complaint on file.   HPI Chelynne Kolin presents for B12 def, calluses, GERD f/u Lost wt  Outpatient Medications Prior to Visit  Medication Sig Dispense Refill  . calcium-vitamin D (OSCAL WITH D) 500-200 MG-UNIT tablet Take 1 tablet by mouth 2 (two) times daily. 100 tablet 3  . Cholecalciferol 1000 UNITS tablet Take 1 tablet (1,000 Units total) by mouth daily. 100 tablet 3  . cyanocobalamin 500 MCG tablet Take 2 tablets (1,000 mcg total) by mouth daily. 100 tablet 3  . Dexlansoprazole (DEXILANT) 30 MG capsule Take 1 capsule (30 mg total) by mouth daily. 90 capsule 3  . loperamide (IMODIUM A-D) 2 MG tablet Take 1-2 tablets (2-4 mg total) by mouth 4 (four) times daily as needed for diarrhea or loose stools. 60 tablet 1  . loratadine (CLARITIN) 10 MG tablet Take 1 tablet (10 mg total) by mouth daily as needed for allergies. 30 tablet 3  . Magnesium 250 MG TABS Take 1 tablet (250 mg total) by mouth daily. 100 tablet 3  . meclizine (ANTIVERT) 12.5 MG tablet Take 1 tablet (12.5 mg total) by mouth 3 (three) times daily as needed for dizziness or nausea. 60 tablet 2  . oxybutynin (DITROPAN) 5 MG tablet Take 1 tablet (5 mg total) by mouth every 8 (eight) hours as needed for bladder spasms (incontinence). 90 tablet 3  . triamcinolone ointment (KENALOG) 0.5 % Apply 1 application topically 2 (two) times daily. 60 g 3   No facility-administered medications prior to visit.    ROS: Review of Systems  Constitutional: Positive for fatigue. Negative for activity change, appetite change, chills and unexpected weight change.  HENT: Negative for congestion, mouth sores and sinus pressure.   Eyes: Negative for visual disturbance.  Respiratory: Negative for cough and chest tightness.   Gastrointestinal: Negative for abdominal pain and nausea.  Genitourinary: Negative for difficulty urinating,  frequency and vaginal pain.  Musculoskeletal: Positive for back pain. Negative for gait problem.  Skin: Negative for pallor and rash.  Neurological: Negative for dizziness, tremors, weakness, numbness and headaches.  Psychiatric/Behavioral: Negative for confusion and sleep disturbance.    Objective:  Ht 4\' 6"  (1.372 m)   Wt 114 lb (51.7 kg)   BMI 27.49 kg/m   BP Readings from Last 3 Encounters:  09/08/19 138/72  05/18/19 (!) 150/80  04/15/19 124/74    Wt Readings from Last 3 Encounters:  12/09/19 114 lb (51.7 kg)  09/08/19 120 lb (54.4 kg)  05/18/19 122 lb (55.3 kg)    Physical Exam Constitutional:      General: She is not in acute distress.    Appearance: She is well-developed.  HENT:     Head: Normocephalic.     Right Ear: External ear normal.     Left Ear: External ear normal.     Nose: Nose normal.  Eyes:     General:        Right eye: No discharge.        Left eye: No discharge.     Conjunctiva/sclera: Conjunctivae normal.     Pupils: Pupils are equal, round, and reactive to light.  Neck:     Thyroid: No thyromegaly.     Vascular: No JVD.     Trachea: No tracheal deviation.  Cardiovascular:     Rate and Rhythm: Normal rate and regular rhythm.  Heart sounds: Normal heart sounds.  Pulmonary:     Effort: No respiratory distress.     Breath sounds: No stridor. No wheezing.  Abdominal:     General: Bowel sounds are normal. There is no distension.     Palpations: Abdomen is soft. There is no mass.     Tenderness: There is no abdominal tenderness. There is no guarding or rebound.  Musculoskeletal:        General: No tenderness.     Cervical back: Normal range of motion and neck supple.  Lymphadenopathy:     Cervical: No cervical adenopathy.  Skin:    Findings: No erythema or rash.  Neurological:     Cranial Nerves: No cranial nerve deficit.     Motor: No abnormal muscle tone.     Coordination: Coordination normal.     Deep Tendon Reflexes: Reflexes  normal.  Psychiatric:        Behavior: Behavior normal.        Thought Content: Thought content normal.        Judgment: Judgment normal.    Hearing aids  Lab Results  Component Value Date   WBC 5.1 11/11/2018   HGB 12.0 11/11/2018   HCT 35.6 (L) 11/11/2018   PLT 231.0 11/11/2018   GLUCOSE 94 05/18/2019   CHOL 219 (H) 11/11/2018   TRIG 128.0 11/11/2018   HDL 50.30 11/11/2018   LDLDIRECT 196.7 12/22/2010   LDLCALC 143 (H) 11/11/2018   ALT 10 05/18/2019   AST 15 05/18/2019   NA 140 05/18/2019   K 3.8 05/18/2019   CL 105 05/18/2019   CREATININE 0.97 05/18/2019   BUN 9 05/18/2019   CO2 27 05/18/2019   TSH 2.18 11/11/2018   INR 0.96 03/24/2010   HGBA1C 5.7 04/26/2010    MM 3D SCREEN BREAST BILATERAL  Result Date: 10/08/2018 CLINICAL DATA:  Screening. EXAM: DIGITAL SCREENING BILATERAL MAMMOGRAM WITH TOMO AND CAD COMPARISON:  Previous exam(s). ACR Breast Density Category c: The breast tissue is heterogeneously dense, which may obscure small masses. FINDINGS: There are no findings suspicious for malignancy. Images were processed with CAD. IMPRESSION: No mammographic evidence of malignancy. A result letter of this screening mammogram will be mailed directly to the patient. RECOMMENDATION: Screening mammogram in one year. (Code:SM-B-01Y) BI-RADS CATEGORY  1: Negative. Electronically Signed   By: Curlene Dolphin M.D.   On: 10/08/2018 15:25    Assessment & Plan:    Follow-up: No follow-ups on file.  Walker Kehr, MD

## 2019-12-09 NOTE — Assessment & Plan Note (Signed)
Problems w/teeth  Wt Readings from Last 3 Encounters:  12/09/19 114 lb (51.7 kg)  09/08/19 120 lb (54.4 kg)  05/18/19 122 lb (55.3 kg)

## 2019-12-09 NOTE — Addendum Note (Signed)
Addended by: Cresenciano Lick on: 12/09/2019 03:01 PM   Modules accepted: Orders

## 2019-12-09 NOTE — Assessment & Plan Note (Signed)
Dexilant 

## 2019-12-09 NOTE — Assessment & Plan Note (Signed)
On B12 

## 2019-12-09 NOTE — Assessment & Plan Note (Signed)
Labs

## 2019-12-16 ENCOUNTER — Other Ambulatory Visit: Payer: Self-pay

## 2019-12-16 ENCOUNTER — Ambulatory Visit
Admission: RE | Admit: 2019-12-16 | Discharge: 2019-12-16 | Disposition: A | Discharge status: Home / Self Care | Payer: Medicare Other | Source: Ambulatory Visit | Attending: Internal Medicine | Admitting: Internal Medicine

## 2019-12-16 DIAGNOSIS — Z1231 Encounter for screening mammogram for malignant neoplasm of breast: Secondary | ICD-10-CM

## 2019-12-17 ENCOUNTER — Other Ambulatory Visit: Payer: Self-pay | Admitting: Internal Medicine

## 2019-12-17 DIAGNOSIS — R928 Other abnormal and inconclusive findings on diagnostic imaging of breast: Secondary | ICD-10-CM

## 2019-12-21 ENCOUNTER — Ambulatory Visit
Admission: RE | Admit: 2019-12-21 | Discharge: 2019-12-21 | Disposition: A | Discharge status: Home / Self Care | Payer: Medicare Other | Source: Ambulatory Visit | Attending: Internal Medicine | Admitting: Internal Medicine

## 2019-12-21 ENCOUNTER — Other Ambulatory Visit: Payer: Self-pay

## 2019-12-21 DIAGNOSIS — R928 Other abnormal and inconclusive findings on diagnostic imaging of breast: Secondary | ICD-10-CM

## 2019-12-21 DIAGNOSIS — R922 Inconclusive mammogram: Secondary | ICD-10-CM | POA: Diagnosis not present

## 2019-12-21 DIAGNOSIS — N6489 Other specified disorders of breast: Secondary | ICD-10-CM | POA: Diagnosis not present

## 2020-01-06 DIAGNOSIS — H35363 Drusen (degenerative) of macula, bilateral: Secondary | ICD-10-CM | POA: Diagnosis not present

## 2020-01-06 DIAGNOSIS — H40023 Open angle with borderline findings, high risk, bilateral: Secondary | ICD-10-CM | POA: Diagnosis not present

## 2020-01-06 DIAGNOSIS — H35373 Puckering of macula, bilateral: Secondary | ICD-10-CM | POA: Diagnosis not present

## 2020-01-06 DIAGNOSIS — H35013 Changes in retinal vascular appearance, bilateral: Secondary | ICD-10-CM | POA: Diagnosis not present

## 2020-04-19 ENCOUNTER — Ambulatory Visit (INDEPENDENT_AMBULATORY_CARE_PROVIDER_SITE_OTHER): Payer: Medicare Other | Admitting: Obstetrics and Gynecology

## 2020-04-19 ENCOUNTER — Encounter: Payer: Self-pay | Admitting: Obstetrics and Gynecology

## 2020-04-19 ENCOUNTER — Other Ambulatory Visit: Payer: Self-pay

## 2020-04-19 VITALS — BP 120/74 | Ht <= 58 in | Wt 111.0 lb

## 2020-04-19 DIAGNOSIS — M810 Age-related osteoporosis without current pathological fracture: Secondary | ICD-10-CM

## 2020-04-19 DIAGNOSIS — N816 Rectocele: Secondary | ICD-10-CM

## 2020-04-19 DIAGNOSIS — Z01419 Encounter for gynecological examination (general) (routine) without abnormal findings: Secondary | ICD-10-CM

## 2020-04-19 NOTE — Progress Notes (Signed)
Joyce Riley November 12, 1933 [id number]  SUBJECTIVE:  86 y.o. [id number] female for annual routine gynecologic exam. She has no gynecologic concerns.  Professional [place] interpreter present for the visit.  Daughter is pediatrician in the area.  Current Outpatient Medications  Medication Sig Dispense Refill  . calcium-vitamin D (OSCAL WITH D) 500-200 MG-UNIT tablet Take 1 tablet by mouth 2 (two) times daily. 100 tablet 3  . Cholecalciferol 1000 UNITS tablet Take 1 tablet (1,000 Units total) by mouth daily. 100 tablet 3  . cyanocobalamin 500 MCG tablet Take 2 tablets (1,000 mcg total) by mouth daily. 100 tablet 3  . Dexlansoprazole (DEXILANT) 30 MG capsule Take 1 capsule (30 mg total) by mouth daily. 90 capsule 3  . loperamide (IMODIUM A-D) 2 MG tablet Take 1-2 tablets (2-4 mg total) by mouth 4 (four) times daily as needed for diarrhea or loose stools. 60 tablet 1  . loratadine (CLARITIN) 10 MG tablet Take 1 tablet (10 mg total) by mouth daily as needed for allergies. 30 tablet 3  . Magnesium 250 MG TABS Take 1 tablet (250 mg total) by mouth daily. 100 tablet 3  . meclizine (ANTIVERT) 12.5 MG tablet Take 1 tablet (12.5 mg total) by mouth 3 (three) times daily as needed for dizziness or nausea. 60 tablet 2  . oxybutynin (DITROPAN) 5 MG tablet Take 1 tablet (5 mg total) by mouth every 8 (eight) hours as needed for bladder spasms (incontinence). 90 tablet 3  . triamcinolone ointment (KENALOG) 0.5 % Apply 1 application topically 2 (two) times daily. 60 g 3   No current facility-administered medications for this visit.   Allergies: Aspirin, Atorvastatin, Codeine, Meclizine hcl, Milk-related compounds, and Simvastatin  No LMP recorded. Patient is postmenopausal.  Past medical history,surgical history, problem list, medications, allergies, family history and social history were all reviewed and documented as reviewed in the EPIC chart.  ROS:  Feeling well. No dyspnea or chest pain on exertion.  No  abdominal pain, change in bowel habits, black or bloody stools.  No urinary tract symptoms. GYN ROS: no abnormal bleeding, pelvic pain or discharge, no breast pain or new or enlarging lumps on self exam.No neurological complaints.   OBJECTIVE:  BP 120/74   Ht 4\' 6"  (1.372 m)   Wt 111 lb (50.3 kg)   BMI 26.76 kg/m  The patient appears well, alert, oriented x 3, in no distress. ENT normal.  Neck supple. No cervical or supraclavicular adenopathy or thyromegaly.  Lungs are clear, good air entry, no wheezes, rhonchi or rales. S1 and S2 normal, no murmurs, regular rate and rhythm.  Abdomen soft without tenderness, guarding, mass or organomegaly.  Neurological is normal, no focal findings.  BREAST EXAM: breasts appear normal, no suspicious masses, no skin or nipple changes or axillary nodes  PELVIC EXAM: VULVA: normal appearing vulva with no masses, tenderness or lesions, atrophic changes, VAGINA: normal appearing vagina with normal color and discharge, no lesions, second-degree rectocele, first-degree uterine prolapse, no significant cystocele, CERVIX: normal appearing cervix without discharge or lesions, UTERUS: uterus is normal size, shape, consistency and nontender, ADNEXA: normal adnexa in size, nontender and no masses, RECTAL: normal rectal, no masses, rectocele confirmed  Chaperone: [name] present during the examination  ASSESSMENT:  86 y.o. [id number] here for annual gynecologic exam  PLAN:   1. Postmenopausal.  No vaginal bleeding or hot flashes. 2. Mild rectocele/pelvic organ prolapse.  Asymptomatic.  Stable on exam. 3. Osteoporosis.  DEXA 04/2019.  Distal one third of radius, T score -  3.1, stable BMD from previous study per Dr. Loetta Rough.  Took Reclast x8 years and discontinued 2019.  She is taking vitamin D and calcium supplementation which we reviewed today.  Repeat DEXA in 2022. 4. Pap smear 2012.  No history of abnormal Pap smears.  Comfortable not continuing screening based on current  guidelines based on age criteria. 5. Mammogram 11/2019.  Normal breast exam today.  Continue with annual mammograms. 6. Colonoscopy 2016.  Recommended that she follow up at the recommended interval.   7. Health maintenance.  No labs today as she normally has these completed elsewhere.  Return annually or sooner, prn.  Joseph Pierini MD 04/19/20

## 2020-06-15 ENCOUNTER — Ambulatory Visit (INDEPENDENT_AMBULATORY_CARE_PROVIDER_SITE_OTHER): Payer: Medicare Other | Admitting: Internal Medicine

## 2020-06-15 ENCOUNTER — Other Ambulatory Visit: Payer: Self-pay

## 2020-06-15 ENCOUNTER — Encounter: Payer: Self-pay | Admitting: Internal Medicine

## 2020-06-15 VITALS — BP 128/86 | HR 78 | Temp 98.1°F | Ht <= 58 in | Wt 118.0 lb

## 2020-06-15 DIAGNOSIS — R634 Abnormal weight loss: Secondary | ICD-10-CM | POA: Diagnosis not present

## 2020-06-15 DIAGNOSIS — M79672 Pain in left foot: Secondary | ICD-10-CM

## 2020-06-15 DIAGNOSIS — M25511 Pain in right shoulder: Secondary | ICD-10-CM

## 2020-06-15 DIAGNOSIS — Z23 Encounter for immunization: Secondary | ICD-10-CM

## 2020-06-15 DIAGNOSIS — M79671 Pain in right foot: Secondary | ICD-10-CM | POA: Diagnosis not present

## 2020-06-15 DIAGNOSIS — K219 Gastro-esophageal reflux disease without esophagitis: Secondary | ICD-10-CM

## 2020-06-15 DIAGNOSIS — G8929 Other chronic pain: Secondary | ICD-10-CM

## 2020-06-15 MED ORDER — DEXILANT 30 MG PO CPDR: 1.0000 | DELAYED_RELEASE_CAPSULE | Freq: Every day | ORAL | 3 refills | Status: DC

## 2020-06-15 NOTE — Progress Notes (Signed)
Subjective:  Patient ID: Joyce Riley, female    DOB: 11/11/1933  Age: 86 y.o. MRN: [id number]  CC: No chief complaint on file.   HPI Joyce Riley presents for R shoulder pain relapsed - shot helped a lot C/o foot pain R>L F/u GERD  Outpatient Medications Prior to Visit  Medication Sig Dispense Refill  . calcium-vitamin D (OSCAL WITH D) 500-200 MG-UNIT tablet Take 1 tablet by mouth 2 (two) times daily. 100 tablet 3  . Cholecalciferol 1000 UNITS tablet Take 1 tablet (1,000 Units total) by mouth daily. 100 tablet 3  . cyanocobalamin 500 MCG tablet Take 2 tablets (1,000 mcg total) by mouth daily. 100 tablet 3  . Dexlansoprazole (DEXILANT) 30 MG capsule Take 1 capsule (30 mg total) by mouth daily. 90 capsule 3  . loperamide (IMODIUM A-D) 2 MG tablet Take 1-2 tablets (2-4 mg total) by mouth 4 (four) times daily as needed for diarrhea or loose stools. 60 tablet 1  . loratadine (CLARITIN) 10 MG tablet Take 1 tablet (10 mg total) by mouth daily as needed for allergies. 30 tablet 3  . Magnesium 250 MG TABS Take 1 tablet (250 mg total) by mouth daily. 100 tablet 3  . meclizine (ANTIVERT) 12.5 MG tablet Take 1 tablet (12.5 mg total) by mouth 3 (three) times daily as needed for dizziness or nausea. 60 tablet 2  . oxybutynin (DITROPAN) 5 MG tablet Take 1 tablet (5 mg total) by mouth every 8 (eight) hours as needed for bladder spasms (incontinence). 90 tablet 3  . triamcinolone ointment (KENALOG) 0.5 % Apply 1 application topically 2 (two) times daily. 60 g 3   No facility-administered medications prior to visit.    ROS: Review of Systems  Constitutional: Negative for activity change, appetite change, chills, fatigue and unexpected weight change.  HENT: Negative for congestion, mouth sores and sinus pressure.   Eyes: Negative for visual disturbance.  Respiratory: Negative for cough and chest tightness.   Gastrointestinal: Positive for constipation. Negative for abdominal pain and nausea.    Genitourinary: Negative for difficulty urinating, frequency and vaginal pain.  Musculoskeletal: Positive for arthralgias. Negative for back pain and gait problem.  Skin: Negative for pallor and rash.  Neurological: Negative for dizziness, tremors, weakness, numbness and headaches.  Psychiatric/Behavioral: Negative for confusion, sleep disturbance and suicidal ideas. The patient is nervous/anxious.     Objective:  BP 128/86   Pulse 78   Temp 98.1 F (36.7 C) (Oral)   Ht 4\' 6"  (1.372 m)   Wt 118 lb (53.5 kg)   SpO2 95%   BMI 28.45 kg/m   BP Readings from Last 3 Encounters:  06/15/20 128/86  04/19/20 120/74  12/09/19 126/72    Wt Readings from Last 3 Encounters:  06/15/20 118 lb (53.5 kg)  04/19/20 111 lb (50.3 kg)  12/09/19 114 lb (51.7 kg)    Physical Exam Constitutional:      General: She is not in acute distress.    Appearance: She is well-developed.  HENT:     Head: Normocephalic.     Right Ear: External ear normal.     Left Ear: External ear normal.     Nose: Nose normal.  Eyes:     General:        Right eye: No discharge.        Left eye: No discharge.     Conjunctiva/sclera: Conjunctivae normal.     Pupils: Pupils are equal, round, and reactive to light.  Neck:  Thyroid: No thyromegaly.     Vascular: No JVD.     Trachea: No tracheal deviation.  Cardiovascular:     Rate and Rhythm: Normal rate and regular rhythm.     Heart sounds: Normal heart sounds.  Pulmonary:     Effort: No respiratory distress.     Breath sounds: No stridor. No wheezing.  Abdominal:     General: Bowel sounds are normal. There is no distension.     Palpations: Abdomen is soft. There is no mass.     Tenderness: There is no abdominal tenderness. There is no guarding or rebound.  Musculoskeletal:        General: Tenderness present.     Cervical back: Normal range of motion and neck supple.  Lymphadenopathy:     Cervical: No cervical adenopathy.  Skin:    Findings: No erythema  or rash.  Neurological:     Cranial Nerves: No cranial nerve deficit.     Motor: No abnormal muscle tone.     Coordination: Coordination normal.     Deep Tendon Reflexes: Reflexes normal.  Psychiatric:        Behavior: Behavior normal.        Thought Content: Thought content normal.        Judgment: Judgment normal.       R shoulder - pain w/ROM and over biceps tendon   Procedure :Joint Injection, R  shoulder   Indication:  Subacromial bursitis with refractory  chronic pain.   Risks including unsuccessful procedure , bleeding, infection, bruising, skin atrophy, "steroid flare-up" and others were explained to the patient in detail as well as the benefits. Informed consent was obtained and signed.   Tthe patient was placed in a comfortable position. Lateral approach was used. Skin was prepped with Betadine and alcohol  and anesthetized with a cooling spray. Then, a 5 cc syringe with a 2 inch long 24-gauge needle was used for a joint injection.. The needle was advanced  Into the subacromial space.The bursa was injected with 3 mL of 2% lidocaine and 40 mg of Depo-Medrol .  Band-Aid was applied.   Tolerated well. Complications: None. Good pain relief following the procedure.    Procedure :  Biceps tendon injection R   Indication:  Bicipital tendonitis with refractory  chronic pain.   Risks including unsuccessful procedure , bleeding, infection, bruising, skin atrophy and others were explained to the patient in detail as well as the benefits. Informed consent was obtained and signed.   Tthe patient was placed in a comfortable position. Skin was prepped with Betadine and alcohol  and anesthetized with a cooling spray. Then, a 5 cc syringe with a 1.5 inch long 25-gauge needle was used for a tendon injection with 2 mL of 2% lidocaine and 10 mg of Depo-Medrol .  Band-Aid was applied.     Tolerated well. Complications: None. Good pain relief following the procedure.    Postprocedure instructions :    A Band-Aid should be left on for 12 hours. Injection therapy is not a cure itself. It is used in conjunction with other modalities. You can use nonsteroidal anti-inflammatories like ibuprofen , hot and cold compresses. Rest is recommended in the next 24 hours. You need to report immediately  if fever, chills or any signs of infection develop.  Lab Results  Component Value Date   WBC 5.1 11/11/2018   HGB 12.0 11/11/2018   HCT 35.6 (L) 11/11/2018   PLT 231.0 11/11/2018   GLUCOSE 89  12/09/2019   CHOL 219 (H) 11/11/2018   TRIG 128.0 11/11/2018   HDL 50.30 11/11/2018   LDLDIRECT 196.7 12/22/2010   LDLCALC 143 (H) 11/11/2018   ALT 12 12/09/2019   AST 15 12/09/2019   NA 139 12/09/2019   K 4.3 12/09/2019   CL 102 12/09/2019   CREATININE 0.99 12/09/2019   BUN 13 12/09/2019   CO2 30 12/09/2019   TSH 1.67 12/09/2019   INR 0.96 03/24/2010   HGBA1C 5.7 04/26/2010    US BREAST LTD UNI RIGHT INC AXILLA  Result Date: 12/21/2019 CLINICAL DATA:  Recall from screening mammography with tomosynthesis, possible mass and separate asymmetry involving the UPPER OUTER QUADRANT of the RIGHT breast. EXAM: DIGITAL DIAGNOSTIC RIGHT MAMMOGRAM WITH CAD AND TOMO ULTRASOUND RIGHT BREAST COMPARISON:  Previous exam(s). ACR Breast Density Category c: The breast tissue is heterogeneously dense, which may obscure small masses. FINDINGS: Tomosynthesis and synthesized spot compression CC and MLO views of the area of concern in the UPPER OUTER RIGHT breast and a tomosynthesis and synthesized full field mediolateral view of the RIGHT breast were obtained. The possible mass questioned in the [place] at MIDDLE depth on screening mammography completely disperses with compression and there is no underlying mass or architectural distortion. A normal appearing intramammary lymph node is present superficially in the [place], stable over multiple prior mammograms. No findings  suspicious for malignancy in the RIGHT breast. The full field mediolateral image was processed with CAD. Targeted RIGHT breast ultrasound is performed, showing normal dense fibroglandular tissue throughout the [place]. No cyst, solid mass or abnormal acoustic shadowing is identified. IMPRESSION: No mammographic or sonographic evidence of malignancy involving the RIGHT breast. RECOMMENDATION: Screening mammogram in one year.(Code:SM-B-01Y) I have discussed the findings and recommendations with the patient and her daughter who is a physician. Communication with the patient was achieved with the assistance of an interpreter. If applicable, a reminder letter will be sent to the patient regarding the next appointment. BI-RADS CATEGORY  1: Negative. Electronically Signed   By: Evangeline Dakin M.D.   On: 12/21/2019 11:52   MM DIAG BREAST TOMO UNI RIGHT  Result Date: 12/21/2019 CLINICAL DATA:  Recall from screening mammography with tomosynthesis, possible mass and separate asymmetry involving the UPPER OUTER QUADRANT of the RIGHT breast. EXAM: DIGITAL DIAGNOSTIC RIGHT MAMMOGRAM WITH CAD AND TOMO ULTRASOUND RIGHT BREAST COMPARISON:  Previous exam(s). ACR Breast Density Category c: The breast tissue is heterogeneously dense, which may obscure small masses. FINDINGS: Tomosynthesis and synthesized spot compression CC and MLO views of the area of concern in the UPPER OUTER RIGHT breast and a tomosynthesis and synthesized full field mediolateral view of the RIGHT breast were obtained. The possible mass questioned in the [place] at MIDDLE depth on screening mammography completely disperses with compression and there is no underlying mass or architectural distortion. A normal appearing intramammary lymph node is present superficially in the [place], stable over multiple prior mammograms. No findings suspicious for malignancy in the RIGHT breast. The full field mediolateral image was  processed with CAD. Targeted RIGHT breast ultrasound is performed, showing normal dense fibroglandular tissue throughout the [place]. No cyst, solid mass or abnormal acoustic shadowing is identified. IMPRESSION: No mammographic or sonographic evidence of malignancy involving the RIGHT breast. RECOMMENDATION: Screening mammogram in one year.(Code:SM-B-01Y) I have discussed the findings and recommendations with the patient and her daughter who is a physician. Communication with the patient was achieved with the assistance  of an interpreter. If applicable, a reminder letter will be sent to the patient regarding the next appointment. BI-RADS CATEGORY  1: Negative. Electronically Signed   By: Evangeline Dakin M.D.   On: 12/21/2019 11:52    Assessment & Plan:   There are no diagnoses linked to this encounter.   No orders of the defined types were placed in this encounter.    Follow-up: No follow-ups on file.  Walker Kehr, MD

## 2020-06-15 NOTE — Assessment & Plan Note (Signed)
Resolved

## 2020-06-15 NOTE — Assessment & Plan Note (Signed)
On Dexilant 

## 2020-06-15 NOTE — Assessment & Plan Note (Signed)
Buy shoes with good arch supports: [employer], [employer], broadcasting/film/video, [employer]

## 2020-06-15 NOTE — Patient Instructions (Addendum)
Postprocedure instructions :    A Band-Aid should be left on for 12 hours. Injection therapy is not a cure itself. It is used in conjunction with other modalities. You can use nonsteroidal anti-inflammatories like ibuprofen , hot and cold compresses. Rest is recommended in the next 24 hours. You need to report immediately  if fever, chills or any signs of infection develop.  Buy shoes with good arch supports: [employer], [employer], [employer]  Wt Readings from Last 3 Encounters:  06/15/20 118 lb (53.5 kg)  04/19/20 111 lb (50.3 kg)  12/09/19 114 lb (51.7 kg)

## 2020-06-21 DIAGNOSIS — Z23 Encounter for immunization: Secondary | ICD-10-CM | POA: Diagnosis not present

## 2020-06-22 DIAGNOSIS — H35373 Puckering of macula, bilateral: Secondary | ICD-10-CM | POA: Diagnosis not present

## 2020-06-22 DIAGNOSIS — H35013 Changes in retinal vascular appearance, bilateral: Secondary | ICD-10-CM | POA: Diagnosis not present

## 2020-06-22 DIAGNOSIS — H35363 Drusen (degenerative) of macula, bilateral: Secondary | ICD-10-CM | POA: Diagnosis not present

## 2020-06-22 DIAGNOSIS — H40023 Open angle with borderline findings, high risk, bilateral: Secondary | ICD-10-CM | POA: Diagnosis not present

## 2020-08-29 ENCOUNTER — Telehealth: Payer: Self-pay | Admitting: Internal Medicine

## 2020-08-29 NOTE — Telephone Encounter (Signed)
LVM for pt to rtn my call at [phone] to schedule AWV with [facility]. Please schedule this appt if pt calls the office.

## 2020-09-13 ENCOUNTER — Other Ambulatory Visit: Payer: Self-pay

## 2020-09-14 ENCOUNTER — Ambulatory Visit (INDEPENDENT_AMBULATORY_CARE_PROVIDER_SITE_OTHER): Payer: Medicare Other | Admitting: Internal Medicine

## 2020-09-14 ENCOUNTER — Encounter: Payer: Self-pay | Admitting: Internal Medicine

## 2020-09-14 DIAGNOSIS — M7062 Trochanteric bursitis, left hip: Secondary | ICD-10-CM | POA: Diagnosis not present

## 2020-09-14 DIAGNOSIS — M545 Low back pain, unspecified: Secondary | ICD-10-CM

## 2020-09-14 DIAGNOSIS — R413 Other amnesia: Secondary | ICD-10-CM | POA: Diagnosis not present

## 2020-09-14 DIAGNOSIS — E559 Vitamin D deficiency, unspecified: Secondary | ICD-10-CM | POA: Diagnosis not present

## 2020-09-14 DIAGNOSIS — E538 Deficiency of other specified B group vitamins: Secondary | ICD-10-CM

## 2020-09-14 DIAGNOSIS — G8929 Other chronic pain: Secondary | ICD-10-CM | POA: Diagnosis not present

## 2020-09-14 DIAGNOSIS — M7072 Other bursitis of hip, left hip: Secondary | ICD-10-CM | POA: Insufficient documentation

## 2020-09-14 MED ORDER — TRIAMCINOLONE ACETONIDE 0.5 % EX OINT: 1.0000 "application " | TOPICAL_OINTMENT | Freq: Two times a day (BID) | CUTANEOUS | 3 refills | Status: AC

## 2020-09-14 MED ORDER — DICLOFENAC SODIUM 1 % EX GEL: 2.0000 g | Freq: Four times a day (QID) | CUTANEOUS | 3 refills | Status: DC

## 2020-09-14 NOTE — Assessment & Plan Note (Signed)
On B complex 

## 2020-09-14 NOTE — Assessment & Plan Note (Signed)
Tylenol prn 

## 2020-09-14 NOTE — Assessment & Plan Note (Signed)
On Vit D 

## 2020-09-14 NOTE — Assessment & Plan Note (Signed)
Voltaren gel 2-4 times a day Ice/heat/massage We can inject the hip with steroids

## 2020-09-14 NOTE — Progress Notes (Signed)
Subjective:  Patient ID: Joyce Riley, female    DOB: Apr 22, 1934  Age: 86 y.o. MRN: [id number]  CC: Follow-up (3 month f/u)   HPI Ula Couvillon presents for GERD, aches and pains, OAB f/u C/o arthralgias, LBP C/o L lat hip pain - worse C/o memory loss  Outpatient Medications Prior to Visit  Medication Sig Dispense Refill   calcium-vitamin D (OSCAL WITH D) 500-200 MG-UNIT tablet Take 1 tablet by mouth 2 (two) times daily. 100 tablet 3   Cholecalciferol 1000 UNITS tablet Take 1 tablet (1,000 Units total) by mouth daily. 100 tablet 3   cyanocobalamin 500 MCG tablet Take 2 tablets (1,000 mcg total) by mouth daily. 100 tablet 3   Dexlansoprazole (DEXILANT) 30 MG capsule Take 1 capsule (30 mg total) by mouth daily. 90 capsule 3   loperamide (IMODIUM A-D) 2 MG tablet Take 1-2 tablets (2-4 mg total) by mouth 4 (four) times daily as needed for diarrhea or loose stools. 60 tablet 1   loratadine (CLARITIN) 10 MG tablet Take 1 tablet (10 mg total) by mouth daily as needed for allergies. 30 tablet 3   Magnesium 250 MG TABS Take 1 tablet (250 mg total) by mouth daily. 100 tablet 3   meclizine (ANTIVERT) 12.5 MG tablet Take 1 tablet (12.5 mg total) by mouth 3 (three) times daily as needed for dizziness or nausea. 60 tablet 2   oxybutynin (DITROPAN) 5 MG tablet Take 1 tablet (5 mg total) by mouth every 8 (eight) hours as needed for bladder spasms (incontinence). 90 tablet 3   triamcinolone ointment (KENALOG) 0.5 % Apply 1 application topically 2 (two) times daily. 60 g 3   No facility-administered medications prior to visit.    ROS: Review of Systems  Constitutional: Positive for fatigue. Negative for activity change, appetite change, chills and unexpected weight change.  HENT: Negative for congestion, mouth sores and sinus pressure.   Eyes: Negative for visual disturbance.  Respiratory: Negative for cough and chest tightness.   Gastrointestinal: Negative for abdominal pain and  nausea.  Genitourinary: Negative for difficulty urinating, frequency and vaginal pain.  Musculoskeletal: Positive for arthralgias, back pain and gait problem.  Skin: Negative for pallor and rash.  Neurological: Negative for dizziness, tremors, weakness, numbness and headaches.  Psychiatric/Behavioral: Negative for confusion and sleep disturbance.    Objective:  BP (!) 142/80 (BP Location: Left Arm)    Pulse 64    Temp 98.8 F (37.1 C) (Oral)    Wt 124 lb 9.6 oz (56.5 kg)    SpO2 96%    BMI 30.04 kg/m   BP Readings from Last 3 Encounters:  09/14/20 (!) 142/80  06/15/20 128/86  04/19/20 120/74    Wt Readings from Last 3 Encounters:  09/14/20 124 lb 9.6 oz (56.5 kg)  06/15/20 118 lb (53.5 kg)  04/19/20 111 lb (50.3 kg)    Physical Exam Constitutional:      General: She is not in acute distress.    Appearance: She is well-developed.  HENT:     Head: Normocephalic.     Right Ear: External ear normal.     Left Ear: External ear normal.     Nose: Nose normal.     Mouth/Throat:     Mouth: Oropharynx is clear and moist.  Eyes:     General:        Right eye: No discharge.        Left eye: No discharge.     Conjunctiva/sclera: Conjunctivae normal.  Pupils: Pupils are equal, round, and reactive to light.  Neck:     Thyroid: No thyromegaly.     Vascular: No JVD.     Trachea: No tracheal deviation.  Cardiovascular:     Rate and Rhythm: Normal rate and regular rhythm.     Heart sounds: Normal heart sounds.  Pulmonary:     Effort: No respiratory distress.     Breath sounds: No stridor. No wheezing.  Abdominal:     General: Bowel sounds are normal. There is no distension.     Palpations: Abdomen is soft. There is no mass.     Tenderness: There is no abdominal tenderness. There is no guarding or rebound.  Musculoskeletal:        General: Tenderness present. No edema.     Cervical back: Normal range of motion and neck supple.  Lymphadenopathy:     Cervical: No cervical  adenopathy.  Skin:    Findings: No erythema or rash.  Neurological:     Cranial Nerves: No cranial nerve deficit.     Motor: No abnormal muscle tone.     Coordination: Coordination normal.     Deep Tendon Reflexes: Reflexes normal.  Psychiatric:        Mood and Affect: Mood and affect normal.        Behavior: Behavior normal.        Thought Content: Thought content normal.        Judgment: Judgment normal.   L lat hip is very tender  Lab Results  Component Value Date   WBC 5.1 11/11/2018   HGB 12.0 11/11/2018   HCT 35.6 (L) 11/11/2018   PLT 231.0 11/11/2018   GLUCOSE 89 12/09/2019   CHOL 219 (H) 11/11/2018   TRIG 128.0 11/11/2018   HDL 50.30 11/11/2018   LDLDIRECT 196.7 12/22/2010   LDLCALC 143 (H) 11/11/2018   ALT 12 12/09/2019   AST 15 12/09/2019   NA 139 12/09/2019   K 4.3 12/09/2019   CL 102 12/09/2019   CREATININE 0.99 12/09/2019   BUN 13 12/09/2019   CO2 30 12/09/2019   TSH 1.67 12/09/2019   INR 0.96 03/24/2010   HGBA1C 5.7 04/26/2010    US BREAST LTD UNI RIGHT INC AXILLA  Result Date: 12/21/2019 CLINICAL DATA:  Recall from screening mammography with tomosynthesis, possible mass and separate asymmetry involving the UPPER OUTER QUADRANT of the RIGHT breast. EXAM: DIGITAL DIAGNOSTIC RIGHT MAMMOGRAM WITH CAD AND TOMO ULTRASOUND RIGHT BREAST COMPARISON:  Previous exam(s). ACR Breast Density Category c: The breast tissue is heterogeneously dense, which may obscure small masses. FINDINGS: Tomosynthesis and synthesized spot compression CC and MLO views of the area of concern in the UPPER OUTER RIGHT breast and a tomosynthesis and synthesized full field mediolateral view of the RIGHT breast were obtained. The possible mass questioned in the [place] at MIDDLE depth on screening mammography completely disperses with compression and there is no underlying mass or architectural distortion. A normal appearing intramammary lymph node is present superficially in the  [place], stable over multiple prior mammograms. No findings suspicious for malignancy in the RIGHT breast. The full field mediolateral image was processed with CAD. Targeted RIGHT breast ultrasound is performed, showing normal dense fibroglandular tissue throughout the [place]. No cyst, solid mass or abnormal acoustic shadowing is identified. IMPRESSION: No mammographic or sonographic evidence of malignancy involving the RIGHT breast. RECOMMENDATION: Screening mammogram in one year.(Code:SM-B-01Y) I have discussed the findings and recommendations with the patient  and her daughter who is a Development worker, community. Communication with the patient was achieved with the assistance of an interpreter. If applicable, a reminder letter will be sent to the patient regarding the next appointment. BI-RADS CATEGORY  1: Negative. Electronically Signed   By: Hulan Saas M.D.   On: 12/21/2019 11:52   MM DIAG BREAST TOMO UNI RIGHT  Result Date: 12/21/2019 CLINICAL DATA:  Recall from screening mammography with tomosynthesis, possible mass and separate asymmetry involving the UPPER OUTER QUADRANT of the RIGHT breast. EXAM: DIGITAL DIAGNOSTIC RIGHT MAMMOGRAM WITH CAD AND TOMO ULTRASOUND RIGHT BREAST COMPARISON:  Previous exam(s). ACR Breast Density Category c: The breast tissue is heterogeneously dense, which may obscure small masses. FINDINGS: Tomosynthesis and synthesized spot compression CC and MLO views of the area of concern in the UPPER OUTER RIGHT breast and a tomosynthesis and synthesized full field mediolateral view of the RIGHT breast were obtained. The possible mass questioned in the UPPER OUTER QUADRANT at MIDDLE depth on screening mammography completely disperses with compression and there is no underlying mass or architectural distortion. A normal appearing intramammary lymph node is present superficially in the UPP[place] OUTER QUADR[place], stable over multiple prior mammograms. No findings suspicious for  malignancy in the RIGHT breast. The full field mediolateral image was processed with CAD. Targeted RIGHT breast ultrasound is performed, showing normal dense fibroglandular tissue throughout the UPPER OUTER QUADRANT. No cyst, solid mass or abnormal acoustic shadowing is identified. IMPRESSION: No mammographic or sonographic evidence of malignancy involving the RIGHT breast. RECOMMENDATION: Screening mammogram in one year.(Code:SM-B-01Y) I have discussed the findings and recommendations with the patient and her daughter who is a physician. Communication with the patient was achieved with the assistance of an interpreter. If applicable, a reminder letter will be sent to the patient regarding the next appointment. BI-RADS CATEGORY  1: Negative. Electronically Signed   By: Hulan Saas M.D.   On: 12/21/2019 11:52    Assessment & Plan:    Sonda Primes, MD

## 2020-09-14 NOTE — Assessment & Plan Note (Signed)
New and mild Try Lion's mane

## 2020-09-14 NOTE — Patient Instructions (Addendum)
Voltaren gel 2-4 times a day Ice/heat/massage We can inject the hip with steroids   Bursitis  Bursitis is when the fluid-filled sac (bursa) that covers and protects a joint is swollen (inflamed). Bursitis is most common near joints such as the knees, elbows, hips, and shoulders. It can cause pain and stiffness. Follow these instructions at home: Medicines  Take over-the-counter and prescription medicines only as told by your doctor.  If you were prescribed an antibiotic medicine, take it as told by your doctor. Do not stop taking it even if you start to feel better. General instructions   Rest the affected area as told by your doctor. ? If you can, raise (elevate) the affected area above the level of your heart while you are sitting or lying down. ? Avoid doing things that make the pain worse.  Use a splint, brace, pad, or walking aid as told by your doctor.  If directed, put ice on the affected area: ? If you have a removable splint or brace, take it off as told by your doctor. ? Put ice in a plastic bag. ? Place a towel between your skin and the bag, or between the splint or brace and the bag. ? Leave the ice on for 20 minutes, 2-3 times a day.  Keep all follow-up visits as told by your doctor. This is important. Preventing symptoms Do these things to help you not have symptoms again:  Wear knee pads if you kneel often.  Wear sturdy running or walking shoes that fit you well.  Take a lot of breaks during activities that involve doing the same movements again and again.  Before you do any activity that takes a lot of effort, get your body ready by stretching.  Stay at a healthy weight or lose weight if your doctor says you should. If you need help doing this, ask your doctor.  Exercise often. If you start any new physical activity, do it slowly. Contact a doctor if you:  Have a fever.  Have chills.  Have symptoms that do not get better with treatment or home  care. Summary  Bursitis is when the fluid-filled sac (bursa) that covers and protects a joint is swollen.  Rest the affected area as told by your doctor.  Avoid doing things that make the pain worse.  Put ice on the affected area as told by your doctor. This information is not intended to replace advice given to you by your health care provider. Make sure you discuss any questions you have with your health care provider. Document Revised: 08/23/2017 Document Reviewed: 07/26/2017 Elsevier Patient Education  [place].    B-complex with Niacin 100 mg    Lion's mane

## 2020-09-19 ENCOUNTER — Ambulatory Visit: Payer: Medicare Other

## 2020-10-26 ENCOUNTER — Encounter: Payer: Self-pay | Admitting: Internal Medicine

## 2020-10-26 ENCOUNTER — Other Ambulatory Visit: Payer: Self-pay

## 2020-10-26 ENCOUNTER — Ambulatory Visit (INDEPENDENT_AMBULATORY_CARE_PROVIDER_SITE_OTHER): Payer: Medicare Other | Admitting: Internal Medicine

## 2020-10-26 DIAGNOSIS — K219 Gastro-esophageal reflux disease without esophagitis: Secondary | ICD-10-CM

## 2020-10-26 DIAGNOSIS — N1831 Chronic kidney disease, stage 3a: Secondary | ICD-10-CM

## 2020-10-26 DIAGNOSIS — E785 Hyperlipidemia, unspecified: Secondary | ICD-10-CM | POA: Diagnosis not present

## 2020-10-26 DIAGNOSIS — E559 Vitamin D deficiency, unspecified: Secondary | ICD-10-CM | POA: Diagnosis not present

## 2020-10-26 DIAGNOSIS — R634 Abnormal weight loss: Secondary | ICD-10-CM

## 2020-10-26 DIAGNOSIS — E538 Deficiency of other specified B group vitamins: Secondary | ICD-10-CM | POA: Diagnosis not present

## 2020-10-26 LAB — COMPREHENSIVE METABOLIC PANEL
ALT: 13 U/L (ref 0–35)
AST: 16 U/L (ref 0–37)
Albumin: 4 g/dL (ref 3.5–5.2)
Alkaline Phosphatase: 46 U/L (ref 39–117)
BUN: 20 mg/dL (ref 6–23)
CO2: 29 mEq/L (ref 19–32)
Calcium: 8.8 mg/dL (ref 8.4–10.5)
Chloride: 104 mEq/L (ref 96–112)
Creatinine, Ser: 1 mg/dL (ref 0.40–1.20)
GFR: 51.74 mL/min — ABNORMAL LOW (ref >60.00)
Glucose, Bld: 87 mg/dL (ref 70–99)
Potassium: 4.3 mEq/L (ref 3.5–5.1)
Sodium: 139 mEq/L (ref 135–145)
Total Bilirubin: 0.6 mg/dL (ref 0.2–1.2)
Total Protein: 7 g/dL (ref 6.0–8.3)

## 2020-10-26 LAB — TSH: TSH: 1.33 u[IU]/mL (ref 0.35–4.50)

## 2020-10-26 NOTE — Assessment & Plan Note (Signed)
Wt Readings from Last 3 Encounters:  10/26/20 122 lb 6.4 oz (55.5 kg)  09/14/20 124 lb 9.6 oz (56.5 kg)  06/15/20 118 lb (53.5 kg)

## 2020-10-26 NOTE — Assessment & Plan Note (Signed)
On Vit D 

## 2020-10-26 NOTE — Assessment & Plan Note (Addendum)
Check BMET.  Avoid NSAIDs

## 2020-10-26 NOTE — Progress Notes (Signed)
Subjective:  Patient ID: Joyce Riley, female    DOB: 03-Jul-1934  Age: 87 y.o. MRN: [id number]  CC: Follow-up (6 WK F/U)   HPI Joyce Riley presents for L hip pain f/u - better on Voltaren gel F/u CRI, OAB  Outpatient Medications Prior to Visit  Medication Sig Dispense Refill  . calcium-vitamin D (OSCAL WITH D) 500-200 MG-UNIT tablet Take 1 tablet by mouth 2 (two) times daily. 100 tablet 3  . Cholecalciferol 1000 UNITS tablet Take 1 tablet (1,000 Units total) by mouth daily. 100 tablet 3  . cyanocobalamin 500 MCG tablet Take 2 tablets (1,000 mcg total) by mouth daily. 100 tablet 3  . Dexlansoprazole (DEXILANT) 30 MG capsule Take 1 capsule (30 mg total) by mouth daily. 90 capsule 3  . diclofenac Sodium (VOLTAREN) 1 % GEL Apply 2 g topically 4 (four) times daily. To the L hip are 100 g 3  . loperamide (IMODIUM A-D) 2 MG tablet Take 1-2 tablets (2-4 mg total) by mouth 4 (four) times daily as needed for diarrhea or loose stools. 60 tablet 1  . loratadine (CLARITIN) 10 MG tablet Take 1 tablet (10 mg total) by mouth daily as needed for allergies. 30 tablet 3  . Magnesium 250 MG TABS Take 1 tablet (250 mg total) by mouth daily. 100 tablet 3  . meclizine (ANTIVERT) 12.5 MG tablet Take 1 tablet (12.5 mg total) by mouth 3 (three) times daily as needed for dizziness or nausea. 60 tablet 2  . oxybutynin (DITROPAN) 5 MG tablet Take 1 tablet (5 mg total) by mouth every 8 (eight) hours as needed for bladder spasms (incontinence). 90 tablet 3  . triamcinolone ointment (KENALOG) 0.5 % Apply 1 application topically 2 (two) times daily. 90 g 3   No facility-administered medications prior to visit.    ROS: Review of Systems  Constitutional: Negative for activity change, appetite change, chills, fatigue and unexpected weight change.  HENT: Negative for congestion, mouth sores and sinus pressure.   Eyes: Negative for visual disturbance.  Respiratory: Negative for cough and chest tightness.    Gastrointestinal: Negative for abdominal pain and nausea.  Genitourinary: Negative for difficulty urinating, frequency and vaginal pain.  Musculoskeletal: Positive for arthralgias, back pain, gait problem and neck pain.  Skin: Negative for pallor and rash.  Neurological: Negative for dizziness, tremors, weakness, numbness and headaches.  Psychiatric/Behavioral: Negative for confusion and sleep disturbance.    Objective:  BP 132/80 (BP Location: Left Arm)   Pulse 71   Temp 98.1 F (36.7 C) (Oral)   Ht 4\' 6"  (1.372 m)   Wt 122 lb 6.4 oz (55.5 kg)   SpO2 96%   BMI 29.51 kg/m   BP Readings from Last 3 Encounters:  10/26/20 132/80  09/14/20 (!) 142/80  06/15/20 128/86    Wt Readings from Last 3 Encounters:  10/26/20 122 lb 6.4 oz (55.5 kg)  09/14/20 124 lb 9.6 oz (56.5 kg)  06/15/20 118 lb (53.5 kg)    Physical Exam Constitutional:      General: She is not in acute distress.    Appearance: She is well-developed.  HENT:     Head: Normocephalic.     Right Ear: External ear normal.     Left Ear: External ear normal.     Nose: Nose normal.     Mouth/Throat:     Mouth: Oropharynx is clear and moist.  Eyes:     General:        Right eye: No discharge.  Left eye: No discharge.     Conjunctiva/sclera: Conjunctivae normal.     Pupils: Pupils are equal, round, and reactive to light.  Neck:     Thyroid: No thyromegaly.     Vascular: No JVD.     Trachea: No tracheal deviation.  Cardiovascular:     Rate and Rhythm: Normal rate and regular rhythm.     Heart sounds: Normal heart sounds.  Pulmonary:     Effort: No respiratory distress.     Breath sounds: No stridor. No wheezing.  Abdominal:     General: Bowel sounds are normal. There is no distension.     Palpations: Abdomen is soft. There is no mass.     Tenderness: There is no abdominal tenderness. There is no guarding or rebound.  Musculoskeletal:        General: No tenderness or edema.     Cervical back: Normal  range of motion and neck supple.  Lymphadenopathy:     Cervical: No cervical adenopathy.  Skin:    Findings: No erythema or rash.  Neurological:     Cranial Nerves: No cranial nerve deficit.     Motor: No abnormal muscle tone.     Coordination: Coordination normal.     Deep Tendon Reflexes: Reflexes normal.  Psychiatric:        Mood and Affect: Mood and affect normal.        Behavior: Behavior normal.        Thought Content: Thought content normal.        Judgment: Judgment normal.     Lab Results  Component Value Date   WBC 5.1 11/11/2018   HGB 12.0 11/11/2018   HCT 35.6 (L) 11/11/2018   PLT 231.0 11/11/2018   GLUCOSE 89 12/09/2019   CHOL 219 (H) 11/11/2018   TRIG 128.0 11/11/2018   HDL 50.30 11/11/2018   LDLDIRECT 196.7 12/22/2010   LDLCALC 143 (H) 11/11/2018   ALT 12 12/09/2019   AST 15 12/09/2019   NA 139 12/09/2019   K 4.3 12/09/2019   CL 102 12/09/2019   CREATININE 0.99 12/09/2019   BUN 13 12/09/2019   CO2 30 12/09/2019   TSH 1.67 12/09/2019   INR 0.96 03/24/2010   HGBA1C 5.7 04/26/2010    US BREAST LTD UNI RIGHT INC AXILLA  Result Date: 12/21/2019 CLINICAL DATA:  Recall from screening mammography with tomosynthesis, possible mass and separate asymmetry involving the UPPER OUTER QUADRANT of the RIGHT breast. EXAM: DIGITAL DIAGNOSTIC RIGHT MAMMOGRAM WITH CAD AND TOMO ULTRASOUND RIGHT BREAST COMPARISON:  Previous exam(s). ACR Breast Density Category c: The breast tissue is heterogeneously dense, which may obscure small masses. FINDINGS: Tomosynthesis and synthesized spot compression CC and MLO views of the area of concern in the UPPER OUTER RIGHT breast and a tomosynthesis and synthesized full field mediolateral view of the RIGHT breast were obtained. The possible mass questioned in the [place] at MIDDLE depth on screening mammography completely disperses with compression and there is no underlying mass or architectural distortion. A normal appearing  intramammary lymph node is present superficially in the [place], stable over multiple prior mammograms. No findings suspicious for malignancy in the RIGHT breast. The full field mediolateral image was processed with CAD. Targeted RIGHT breast ultrasound is performed, showing normal dense fibroglandular tissue throughout the [place]. No cyst, solid mass or abnormal acoustic shadowing is identified. IMPRESSION: No mammographic or sonographic evidence of malignancy involving the RIGHT breast. RECOMMENDATION: Screening mammogram in one year.(Code:SM-B-01Y)  I have discussed the findings and recommendations with the patient and her daughter who is a physician. Communication with the patient was achieved with the assistance of an interpreter. If applicable, a reminder letter will be sent to the patient regarding the next appointment. BI-RADS CATEGORY  1: Negative. Electronically Signed   By: Evangeline Dakin M.D.   On: 12/21/2019 11:52   MM DIAG BREAST TOMO UNI RIGHT  Result Date: 12/21/2019 CLINICAL DATA:  Recall from screening mammography with tomosynthesis, possible mass and separate asymmetry involving the UPPER OUTER QUADRANT of the RIGHT breast. EXAM: DIGITAL DIAGNOSTIC RIGHT MAMMOGRAM WITH CAD AND TOMO ULTRASOUND RIGHT BREAST COMPARISON:  Previous exam(s). ACR Breast Density Category c: The breast tissue is heterogeneously dense, which may obscure small masses. FINDINGS: Tomosynthesis and synthesized spot compression CC and MLO views of the area of concern in the UPPER OUTER RIGHT breast and a tomosynthesis and synthesized full field mediolateral view of the RIGHT breast were obtained. The possible mass questioned in the [place] at MIDDLE depth on screening mammography completely disperses with compression and there is no underlying mass or architectural distortion. A normal appearing intramammary lymph node is present superficially in the [place], stable over  multiple prior mammograms. No findings suspicious for malignancy in the RIGHT breast. The full field mediolateral image was processed with CAD. Targeted RIGHT breast ultrasound is performed, showing normal dense fibroglandular tissue throughout the [place]. No cyst, solid mass or abnormal acoustic shadowing is identified. IMPRESSION: No mammographic or sonographic evidence of malignancy involving the RIGHT breast. RECOMMENDATION: Screening mammogram in one year.(Code:SM-B-01Y) I have discussed the findings and recommendations with the patient and her daughter who is a physician. Communication with the patient was achieved with the assistance of an interpreter. If applicable, a reminder letter will be sent to the patient regarding the next appointment. BI-RADS CATEGORY  1: Negative. Electronically Signed   By: Evangeline Dakin M.D.   On: 12/21/2019 11:52    Assessment & Plan:    Walker Kehr, MD

## 2020-10-26 NOTE — Assessment & Plan Note (Signed)
On B complex 

## 2020-10-26 NOTE — Assessment & Plan Note (Signed)
Statin intolerant 

## 2020-10-30 NOTE — Assessment & Plan Note (Signed)
Continue with Dexilant

## 2020-11-01 ENCOUNTER — Other Ambulatory Visit: Payer: Self-pay

## 2020-11-01 ENCOUNTER — Ambulatory Visit (INDEPENDENT_AMBULATORY_CARE_PROVIDER_SITE_OTHER): Payer: Medicare Other

## 2020-11-01 VITALS — BP 124/80 | HR 67 | Ht <= 58 in | Wt 122.0 lb

## 2020-11-01 DIAGNOSIS — Z0000 Encounter for general adult medical examination without abnormal findings: Secondary | ICD-10-CM | POA: Diagnosis not present

## 2020-11-01 NOTE — Patient Instructions (Signed)
Joyce Riley , Thank you for taking time to come for your Medicare Wellness Visit. I appreciate your ongoing commitment to your health goals. Please review the following plan we discussed and let me know if I can assist you in the future.   Screening recommendations/referrals: Colonoscopy: 06/15/2015; no repeat due to age 87: 12/21/2019 Bone Density: 04/30/2019; due every 2 years Recommended yearly ophthalmology/optometry visit for glaucoma screening and checkup Recommended yearly dental visit for hygiene and checkup  Vaccinations: Influenza vaccine: 06/15/2020 Pneumococcal vaccine: 09/15/2013, 11/02/2016 Tdap vaccine: declined Shingles vaccine: 06/22/2017   Covid-19: 10/17/2019, 11/07/2019, 06/21/2020  Advanced directives: Advance directive discussed with you today. Even though you declined this today please call our office should you change your mind and we can give you the proper paperwork for you to fill out.  Conditions/risks identified: Yes. Reviewed health maintenance screenings with patient today and relevant education, vaccines, and/or referrals were provided. Continue doing brain stimulating activities (puzzles, reading, adult coloring books, staying active) to keep memory sharp. Continue to eat heart healthy diet (full of fruits, vegetables, whole grains, lean protein, water--limit salt, fat, and sugar intake) and increase physical activity as tolerated.  Next appointment: Please schedule your next Medicare Wellness Visit with your Nurse Health Advisor in 1 year by calling [phone].   Preventive Care 5 Years and Older, Female Preventive care refers to lifestyle choices and visits with your health care provider that can promote health and wellness. What does preventive care include?  A yearly physical exam. This is also called an annual well check.  Dental exams once or twice a year.  Routine eye exams. Ask your health care provider how often you should have your eyes  checked.  Personal lifestyle choices, including:  Daily care of your teeth and gums.  Regular physical activity.  Eating a healthy diet.  Avoiding tobacco and drug use.  Limiting alcohol use.  Practicing safe sex.  Taking low-dose aspirin every day.  Taking vitamin and mineral supplements as recommended by your health care provider. What happens during an annual well check? The services and screenings done by your health care provider during your annual well check will depend on your age, overall health, lifestyle risk factors, and family history of disease. Counseling  Your health care provider may ask you questions about your:  Alcohol use.  Tobacco use.  Drug use.  Emotional well-being.  Home and relationship well-being.  Sexual activity.  Eating habits.  History of falls.  Memory and ability to understand (cognition).  Work and work Statistician.  Reproductive health. Screening  You may have the following tests or measurements:  Height, weight, and BMI.  Blood pressure.  Lipid and cholesterol levels. These may be checked every 5 years, or more frequently if you are over 14 years old.  Skin check.  Lung cancer screening. You may have this screening every year starting at age 40 if you have a 30-pack-year history of smoking and currently smoke or have quit within the past 15 years.  Fecal occult blood test (FOBT) of the stool. You may have this test every year starting at age 39.  Flexible sigmoidoscopy or colonoscopy. You may have a sigmoidoscopy every 5 years or a colonoscopy every 10 years starting at age 31.  Hepatitis C blood test.  Hepatitis B blood test.  Sexually transmitted disease (STD) testing.  Diabetes screening. This is done by checking your blood sugar (glucose) after you have not eaten for a while (fasting). You may have this done  every 1-3 years.  Bone density scan. This is done to screen for osteoporosis. You may have this done  starting at age 80.  Mammogram. This may be done every 1-2 years. Talk to your health care provider about how often you should have regular mammograms. Talk with your health care provider about your test results, treatment options, and if necessary, the need for more tests. Vaccines  Your health care provider may recommend certain vaccines, such as:  Influenza vaccine. This is recommended every year.  Tetanus, diphtheria, and acellular pertussis (Tdap, Td) vaccine. You may need a Td booster every 10 years.  Zoster vaccine. You may need this after age 75.  Pneumococcal 13-valent conjugate (PCV13) vaccine. One dose is recommended after age 30.  Pneumococcal polysaccharide (PPSV23) vaccine. One dose is recommended after age 71. Talk to your health care provider about which screenings and vaccines you need and how often you need them. This information is not intended to replace advice given to you by your health care provider. Make sure you discuss any questions you have with your health care provider. Document Released: 10/07/2015 Document Revised: 05/30/2016 Document Reviewed: 07/12/2015 Elsevier Interactive Patient Education  2017 [place] Prevention in the Home Falls can cause injuries. They can happen to people of all ages. There are many things you can do to make your home safe and to help prevent falls. What can I do on the outside of my home?  Regularly fix the edges of walkways and driveways and fix any cracks.  Remove anything that might make you trip as you walk through a door, such as a raised step or threshold.  Trim any bushes or trees on the path to your home.  Use bright outdoor lighting.  Clear any walking paths of anything that might make someone trip, such as rocks or tools.  Regularly check to see if handrails are loose or broken. Make sure that both sides of any steps have handrails.  Any raised decks and porches should have guardrails on the  edges.  Have any leaves, snow, or ice cleared regularly.  Use sand or salt on walking paths during winter.  Clean up any spills in your garage right away. This includes oil or grease spills. What can I do in the bathroom?  Use night lights.  Install grab bars by the toilet and in the tub and shower. Do not use towel bars as grab bars.  Use non-skid mats or decals in the tub or shower.  If you need to sit down in the shower, use a plastic, non-slip stool.  Keep the floor dry. Clean up any water that spills on the floor as soon as it happens.  Remove soap buildup in the tub or shower regularly.  Attach bath mats securely with double-sided non-slip rug tape.  Do not have throw rugs and other things on the floor that can make you trip. What can I do in the bedroom?  Use night lights.  Make sure that you have a light by your bed that is easy to reach.  Do not use any sheets or blankets that are too big for your bed. They should not hang down onto the floor.  Have a firm chair that has side arms. You can use this for support while you get dressed.  Do not have throw rugs and other things on the floor that can make you trip. What can I do in the kitchen?  Clean up any spills right  away.  Avoid walking on wet floors.  Keep items that you use a lot in easy-to-reach places.  If you need to reach something above you, use a strong step stool that has a grab bar.  Keep electrical cords out of the way.  Do not use floor polish or wax that makes floors slippery. If you must use wax, use non-skid floor wax.  Do not have throw rugs and other things on the floor that can make you trip. What can I do with my stairs?  Do not leave any items on the stairs.  Make sure that there are handrails on both sides of the stairs and use them. Fix handrails that are broken or loose. Make sure that handrails are as long as the stairways.  Check any carpeting to make sure that it is firmly  attached to the stairs. Fix any carpet that is loose or worn.  Avoid having throw rugs at the top or bottom of the stairs. If you do have throw rugs, attach them to the floor with carpet tape.  Make sure that you have a light switch at the top of the stairs and the bottom of the stairs. If you do not have them, ask someone to add them for you. What else can I do to help prevent falls?  Wear shoes that:  Do not have high heels.  Have rubber bottoms.  Are comfortable and fit you well.  Are closed at the toe. Do not wear sandals.  If you use a stepladder:  Make sure that it is fully opened. Do not climb a closed stepladder.  Make sure that both sides of the stepladder are locked into place.  Ask someone to hold it for you, if possible.  Clearly mark and make sure that you can see:  Any grab bars or handrails.  First and last steps.  Where the edge of each step is.  Use tools that help you move around (mobility aids) if they are needed. These include:  Canes.  Walkers.  Scooters.  Crutches.  Turn on the lights when you go into a dark area. Replace any light bulbs as soon as they burn out.  Set up your furniture so you have a clear path. Avoid moving your furniture around.  If any of your floors are uneven, fix them.  If there are any pets around you, be aware of where they are.  Review your medicines with your doctor. Some medicines can make you feel dizzy. This can increase your chance of falling. Ask your doctor what other things that you can do to help prevent falls. This information is not intended to replace advice given to you by your health care provider. Make sure you discuss any questions you have with your health care provider. Document Released: 07/07/2009 Document Revised: 02/16/2016 Document Reviewed: 10/15/2014 Elsevier Interactive Patient Education  2017 [employer].
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
## 2020-12-13 ENCOUNTER — Other Ambulatory Visit: Payer: Self-pay | Admitting: Internal Medicine

## 2020-12-13 DIAGNOSIS — Z0000 Encounter for general adult medical examination without abnormal findings: Secondary | ICD-10-CM

## 2021-01-17 DIAGNOSIS — Z23 Encounter for immunization: Secondary | ICD-10-CM | POA: Diagnosis not present

## 2021-02-06 ENCOUNTER — Other Ambulatory Visit: Payer: Self-pay

## 2021-02-06 ENCOUNTER — Ambulatory Visit
Admission: RE | Admit: 2021-02-06 | Discharge: 2021-02-06 | Disposition: A | Discharge status: Home / Self Care | Payer: Medicare Other | Source: Ambulatory Visit | Attending: Internal Medicine | Admitting: Internal Medicine

## 2021-02-06 DIAGNOSIS — Z0000 Encounter for general adult medical examination without abnormal findings: Secondary | ICD-10-CM

## 2021-02-06 DIAGNOSIS — Z1231 Encounter for screening mammogram for malignant neoplasm of breast: Secondary | ICD-10-CM | POA: Diagnosis not present

## 2021-02-14 ENCOUNTER — Other Ambulatory Visit: Payer: Self-pay

## 2021-02-14 ENCOUNTER — Encounter (HOSPITAL_COMMUNITY): Payer: Self-pay | Admitting: *Deleted

## 2021-02-14 ENCOUNTER — Ambulatory Visit (HOSPITAL_COMMUNITY)
Admission: EM | Admit: 2021-02-14 | Discharge: 2021-02-14 | Disposition: A | Discharge status: Home / Self Care | Payer: Medicare Other | Attending: Internal Medicine | Admitting: Internal Medicine

## 2021-02-14 ENCOUNTER — Ambulatory Visit (INDEPENDENT_AMBULATORY_CARE_PROVIDER_SITE_OTHER): Payer: Medicare Other

## 2021-02-14 DIAGNOSIS — W19XXXA Unspecified fall, initial encounter: Secondary | ICD-10-CM | POA: Diagnosis not present

## 2021-02-14 DIAGNOSIS — M2011 Hallux valgus (acquired), right foot: Secondary | ICD-10-CM | POA: Diagnosis not present

## 2021-02-14 DIAGNOSIS — S93601A Unspecified sprain of right foot, initial encounter: Secondary | ICD-10-CM | POA: Diagnosis not present

## 2021-02-14 DIAGNOSIS — M19071 Primary osteoarthritis, right ankle and foot: Secondary | ICD-10-CM | POA: Diagnosis not present

## 2021-02-14 NOTE — ED Triage Notes (Signed)
Pt had a slip and fall today . Pt now has Rt foot pain. Pt did not hit her head.

## 2021-02-14 NOTE — Discharge Instructions (Addendum)
As we discussed, I recommend using the wrap on your foot for comfort.  Ice 2-3 times daily for 15 minutes at a time.  Take Tylenol as needed for pain.  I would continue using your walker for now until symptoms improve.  Please follow-up for any continued or worsening symptoms.
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
## 2021-02-23 ENCOUNTER — Encounter: Payer: Self-pay | Admitting: Internal Medicine

## 2021-02-23 ENCOUNTER — Other Ambulatory Visit: Payer: Self-pay

## 2021-02-23 ENCOUNTER — Ambulatory Visit (INDEPENDENT_AMBULATORY_CARE_PROVIDER_SITE_OTHER): Payer: Medicare Other | Admitting: Internal Medicine

## 2021-02-23 DIAGNOSIS — M79671 Pain in right foot: Secondary | ICD-10-CM | POA: Diagnosis not present

## 2021-02-23 DIAGNOSIS — N1831 Chronic kidney disease, stage 3a: Secondary | ICD-10-CM

## 2021-02-23 DIAGNOSIS — E785 Hyperlipidemia, unspecified: Secondary | ICD-10-CM | POA: Diagnosis not present

## 2021-02-23 DIAGNOSIS — E538 Deficiency of other specified B group vitamins: Secondary | ICD-10-CM

## 2021-02-23 NOTE — Assessment & Plan Note (Signed)
Hydrate well daily

## 2021-02-23 NOTE — Assessment & Plan Note (Signed)
R foot pain - pt fell at home and twisted R foot on 02/14/21. Pt went to ED - X ray w/o fx

## 2021-02-23 NOTE — Progress Notes (Signed)
Subjective:  Patient ID: Joyce Riley, female    DOB: 09-05-1934  Age: 87 y.o. MRN: [id number]  CC: Follow-up (4 month check)   HPI Joyce Riley presents for R foot pain - pt fell at home and twisted R foot on 02/14/21. Pt went to ED - X ray w/o fx F/u on Vit B12 def, dyslipidemia, wt loss  Outpatient Medications Prior to Visit  Medication Sig Dispense Refill  . calcium-vitamin D (OSCAL WITH D) 500-200 MG-UNIT tablet Take 1 tablet by mouth 2 (two) times daily. 100 tablet 3  . Cholecalciferol 1000 UNITS tablet Take 1 tablet (1,000 Units total) by mouth daily. 100 tablet 3  . cyanocobalamin 500 MCG tablet Take 2 tablets (1,000 mcg total) by mouth daily. 100 tablet 3  . Dexlansoprazole (DEXILANT) 30 MG capsule Take 1 capsule (30 mg total) by mouth daily. 90 capsule 3  . diclofenac Sodium (VOLTAREN) 1 % GEL Apply 2 g topically 4 (four) times daily. To the L hip are 100 g 3  . loperamide (IMODIUM A-D) 2 MG tablet Take 1-2 tablets (2-4 mg total) by mouth 4 (four) times daily as needed for diarrhea or loose stools. 60 tablet 1  . loratadine (CLARITIN) 10 MG tablet Take 1 tablet (10 mg total) by mouth daily as needed for allergies. 30 tablet 3  . Magnesium 250 MG TABS Take 1 tablet (250 mg total) by mouth daily. 100 tablet 3  . meclizine (ANTIVERT) 12.5 MG tablet Take 1 tablet (12.5 mg total) by mouth 3 (three) times daily as needed for dizziness or nausea. 60 tablet 2  . oxybutynin (DITROPAN) 5 MG tablet Take 1 tablet (5 mg total) by mouth every 8 (eight) hours as needed for bladder spasms (incontinence). 90 tablet 3  . triamcinolone ointment (KENALOG) 0.5 % Apply 1 application topically 2 (two) times daily. 90 g 3   No facility-administered medications prior to visit.    ROS: Review of Systems  Constitutional: Negative for activity change, appetite change, chills, fatigue and unexpected weight change.  HENT: Negative for congestion, mouth sores and sinus pressure.   Eyes: Negative for  visual disturbance.  Respiratory: Negative for cough and chest tightness.   Gastrointestinal: Negative for abdominal pain and nausea.  Genitourinary: Negative for difficulty urinating, frequency and vaginal pain.  Musculoskeletal: Positive for arthralgias and gait problem. Negative for back pain.  Skin: Negative for pallor and rash.  Neurological: Negative for dizziness, tremors, weakness, numbness and headaches.  Psychiatric/Behavioral: Positive for decreased concentration. Negative for confusion, sleep disturbance and suicidal ideas.    Objective:  BP 120/68   Pulse 68   Temp 98.4 F (36.9 C) (Oral)   Ht 4\' 6"  (1.372 m)   Wt 119 lb (54 kg)   SpO2 97%   BMI 28.69 kg/m   BP Readings from Last 3 Encounters:  02/23/21 120/68  02/14/21 (!) 150/83  11/01/20 124/80    Wt Readings from Last 3 Encounters:  02/23/21 119 lb (54 kg)  11/01/20 122 lb (55.3 kg)  10/26/20 122 lb 6.4 oz (55.5 kg)    Physical Exam Constitutional:      General: She is not in acute distress.    Appearance: She is well-developed. She is obese.  HENT:     Head: Normocephalic.     Right Ear: External ear normal.     Left Ear: External ear normal.     Nose: Nose normal.  Eyes:     General:  Right eye: No discharge.        Left eye: No discharge.     Conjunctiva/sclera: Conjunctivae normal.     Pupils: Pupils are equal, round, and reactive to light.  Neck:     Thyroid: No thyromegaly.     Vascular: No JVD.     Trachea: No tracheal deviation.  Cardiovascular:     Rate and Rhythm: Normal rate and regular rhythm.     Heart sounds: Normal heart sounds.  Pulmonary:     Effort: No respiratory distress.     Breath sounds: No stridor. No wheezing.  Abdominal:     General: Bowel sounds are normal. There is no distension.     Palpations: Abdomen is soft. There is no mass.     Tenderness: There is no abdominal tenderness. There is no guarding or rebound.  Musculoskeletal:        General:  Tenderness present.     Cervical back: Normal range of motion and neck supple.  Lymphadenopathy:     Cervical: No cervical adenopathy.  Skin:    Findings: No erythema or rash.  Neurological:     Mental Status: She is oriented to person, place, and time.     Cranial Nerves: No cranial nerve deficit.     Motor: No abnormal muscle tone.     Coordination: Coordination normal.     Deep Tendon Reflexes: Reflexes normal.  Psychiatric:        Behavior: Behavior normal.        Thought Content: Thought content normal.        Judgment: Judgment normal.   R toes w/bruise Trace edema   Lab Results  Component Value Date   WBC 5.1 11/11/2018   HGB 12.0 11/11/2018   HCT 35.6 (L) 11/11/2018   PLT 231.0 11/11/2018   GLUCOSE 87 10/26/2020   CHOL 219 (H) 11/11/2018   TRIG 128.0 11/11/2018   HDL 50.30 11/11/2018   LDLDIRECT 196.7 12/22/2010   LDLCALC 143 (H) 11/11/2018   ALT 13 10/26/2020   AST 16 10/26/2020   NA 139 10/26/2020   K 4.3 10/26/2020   CL 104 10/26/2020   CREATININE 1.00 10/26/2020   BUN 20 10/26/2020   CO2 29 10/26/2020   TSH 1.33 10/26/2020   INR 0.96 03/24/2010   HGBA1C 5.7 04/26/2010    DG Foot Complete Right  Result Date: 02/14/2021 CLINICAL DATA:  Fall with right foot pain.  Slip and fall today. EXAM: RIGHT FOOT COMPLETE - 3+ VIEW COMPARISON:  None. FINDINGS: No acute fracture or dislocation. Moderate hallux valgus with mild degenerative change of the first metatarsal phalangeal joint. There are hammertoe deformity of the digits. Small plantar calcaneal spur. No erosion or bone destruction. Mild generalized soft tissue edema IMPRESSION: 1. No acute fracture or dislocation. 2. Moderate hallux valgus with mild degenerative change of the first metatarsophalangeal joint. Electronically Signed   By: Keith Rake M.D.   On: 02/14/2021 16:06    Assessment & Plan:   There are no diagnoses linked to this encounter.   No orders of the defined types were placed in this  encounter.    Follow-up: No follow-ups on file.  Walker Kehr, MD

## 2021-02-23 NOTE — Assessment & Plan Note (Signed)
Stable Continue w/B complex po

## 2021-02-23 NOTE — Assessment & Plan Note (Signed)
Chronic - statin intolerant. Continue w/diet and exercise.

## 2021-02-24 LAB — BASIC METABOLIC PANEL
BUN: 16 mg/dL (ref 6–23)
CO2: 28 mEq/L (ref 19–32)
Calcium: 8.7 mg/dL (ref 8.4–10.5)
Chloride: 105 mEq/L (ref 96–112)
Creatinine, Ser: 0.86 mg/dL (ref 0.40–1.20)
GFR: 61.86 mL/min (ref >60.00)
Glucose, Bld: 86 mg/dL (ref 70–99)
Potassium: 4.3 mEq/L (ref 3.5–5.1)
Sodium: 141 mEq/L (ref 135–145)
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
## 2021-04-27 ENCOUNTER — Other Ambulatory Visit: Payer: Self-pay

## 2021-04-27 ENCOUNTER — Other Ambulatory Visit (HOSPITAL_COMMUNITY)
Admission: RE | Admit: 2021-04-27 | Discharge: 2021-04-27 | Disposition: A | Discharge status: Home / Self Care | Payer: Medicare Other | Source: Ambulatory Visit | Attending: Obstetrics and Gynecology | Admitting: Obstetrics and Gynecology

## 2021-04-27 ENCOUNTER — Encounter: Payer: Self-pay | Admitting: Obstetrics and Gynecology

## 2021-04-27 ENCOUNTER — Ambulatory Visit (INDEPENDENT_AMBULATORY_CARE_PROVIDER_SITE_OTHER): Payer: Medicare Other | Admitting: Obstetrics and Gynecology

## 2021-04-27 VITALS — BP 138/72 | HR 80 | Ht <= 58 in | Wt 119.0 lb

## 2021-04-27 DIAGNOSIS — Z1239 Encounter for other screening for malignant neoplasm of breast: Secondary | ICD-10-CM

## 2021-04-27 DIAGNOSIS — Z01411 Encounter for gynecological examination (general) (routine) with abnormal findings: Secondary | ICD-10-CM | POA: Diagnosis not present

## 2021-04-27 DIAGNOSIS — N816 Rectocele: Secondary | ICD-10-CM

## 2021-04-27 DIAGNOSIS — Z124 Encounter for screening for malignant neoplasm of cervix: Secondary | ICD-10-CM

## 2021-04-27 DIAGNOSIS — Z01419 Encounter for gynecological examination (general) (routine) without abnormal findings: Secondary | ICD-10-CM

## 2021-04-27 DIAGNOSIS — M810 Age-related osteoporosis without current pathological fracture: Secondary | ICD-10-CM

## 2021-04-27 DIAGNOSIS — Z008 Encounter for other general examination: Secondary | ICD-10-CM

## 2021-04-27 NOTE — Patient Instructions (Signed)
Consider Metamucil to help with bowel function.  This is available in a powder a capsule.  You can buy this at any pharmacy without a prescription.    EXERCISE AND DIET:  We recommended that you start or continue a regular exercise program for good health. Regular exercise means any activity that makes your heart beat faster and makes you sweat.  We recommend exercising at least 30 minutes per day at least 3 days a week, preferably 4 or 5.  We also recommend a diet low in fat and sugar.  Inactivity, poor dietary choices and obesity can cause diabetes, heart attack, stroke, and kidney damage, among others.    ALCOHOL AND SMOKING:  Women should limit their alcohol intake to no more than 7 drinks/beers/glasses of wine (combined, not each!) per week. Moderation of alcohol intake to this level decreases your risk of breast cancer and liver damage. And of course, no recreational drugs are part of a healthy lifestyle.  And absolutely no smoking or even second hand smoke. Most people know smoking can cause heart and lung diseases, but did you know it also contributes to weakening of your bones? Aging of your skin?  Yellowing of your teeth and nails?  CALCIUM AND VITAMIN D:  Adequate intake of calcium and Vitamin D are recommended.  The recommendations for exact amounts of these supplements seem to change often, but generally speaking 600 mg of calcium (either carbonate or citrate) and 800 units of Vitamin D per day seems prudent. Certain women may benefit from higher intake of Vitamin D.  If you are among these women, your doctor will have told you during your visit.    PAP SMEARS:  Pap smears, to check for cervical cancer or precancers,  have traditionally been done yearly, although recent scientific advances have shown that most women can have pap smears less often.  However, every woman still should have a physical exam from her gynecologist every year. It will include a breast check, inspection of the vulva  and vagina to check for abnormal growths or skin changes, a visual exam of the cervix, and then an exam to evaluate the size and shape of the uterus and ovaries.  And after 85 years of age, a rectal exam is indicated to check for rectal cancers. We will also provide age appropriate advice regarding health maintenance, like when you should have certain vaccines, screening for sexually transmitted diseases, bone density testing, colonoscopy, mammograms, etc.   MAMMOGRAMS:  All women over 85 years old should have a yearly mammogram. Many facilities now offer a "3D" mammogram, which may cost around $50 extra out of pocket. If possible,  we recommend you accept the option to have the 3D mammogram performed.  It both reduces the number of women who will be called back for extra views which then turn out to be normal, and it is better than the routine mammogram at detecting truly abnormal areas.    COLONOSCOPY:  Colonoscopy to screen for colon cancer is recommended for all women at age 85.  We know, you hate the idea of the prep.  We agree, BUT, having colon cancer and not knowing it is worse!!  Colon cancer so often starts as a polyp that can be seen and removed at colonscopy, which can quite literally save your life!  And if your first colonoscopy is normal and you have no family history of colon cancer, most women don't have to have it again for 10 years.  Once  every ten years, you can do something that may end up saving your life, right?  We will be happy to help you get it scheduled when you are ready.  Be sure to check your insurance coverage so you understand how much it will cost.  It may be covered as a preventative service at no cost, but you should check your particular policy.

## 2021-05-01 ENCOUNTER — Other Ambulatory Visit: Payer: Self-pay

## 2021-05-01 DIAGNOSIS — M818 Other osteoporosis without current pathological fracture: Secondary | ICD-10-CM

## 2021-05-01 LAB — CYTOLOGY - PAP: Diagnosis: NEGATIVE

## 2021-05-16 ENCOUNTER — Ambulatory Visit (INDEPENDENT_AMBULATORY_CARE_PROVIDER_SITE_OTHER): Payer: Medicare Other

## 2021-05-16 ENCOUNTER — Other Ambulatory Visit: Payer: Self-pay | Admitting: Obstetrics and Gynecology

## 2021-05-16 ENCOUNTER — Other Ambulatory Visit: Payer: Self-pay

## 2021-05-16 DIAGNOSIS — M810 Age-related osteoporosis without current pathological fracture: Secondary | ICD-10-CM

## 2021-05-16 DIAGNOSIS — M818 Other osteoporosis without current pathological fracture: Secondary | ICD-10-CM

## 2021-05-16 DIAGNOSIS — Z780 Asymptomatic menopausal state: Secondary | ICD-10-CM | POA: Diagnosis not present

## 2021-06-06 DIAGNOSIS — Z23 Encounter for immunization: Secondary | ICD-10-CM | POA: Diagnosis not present

## 2021-07-05 ENCOUNTER — Encounter: Payer: Self-pay | Admitting: Internal Medicine

## 2021-07-05 ENCOUNTER — Ambulatory Visit (INDEPENDENT_AMBULATORY_CARE_PROVIDER_SITE_OTHER): Payer: Medicare Other | Admitting: Internal Medicine

## 2021-07-05 ENCOUNTER — Telehealth: Payer: Self-pay | Admitting: Internal Medicine

## 2021-07-05 ENCOUNTER — Other Ambulatory Visit: Payer: Self-pay

## 2021-07-05 VITALS — BP 132/70 | HR 64 | Temp 98.1°F | Ht <= 58 in | Wt 121.8 lb

## 2021-07-05 DIAGNOSIS — Z23 Encounter for immunization: Secondary | ICD-10-CM

## 2021-07-05 DIAGNOSIS — E559 Vitamin D deficiency, unspecified: Secondary | ICD-10-CM

## 2021-07-05 DIAGNOSIS — E785 Hyperlipidemia, unspecified: Secondary | ICD-10-CM

## 2021-07-05 DIAGNOSIS — M810 Age-related osteoporosis without current pathological fracture: Secondary | ICD-10-CM | POA: Diagnosis not present

## 2021-07-05 DIAGNOSIS — K219 Gastro-esophageal reflux disease without esophagitis: Secondary | ICD-10-CM

## 2021-07-05 DIAGNOSIS — E538 Deficiency of other specified B group vitamins: Secondary | ICD-10-CM | POA: Diagnosis not present

## 2021-07-05 DIAGNOSIS — N1831 Chronic kidney disease, stage 3a: Secondary | ICD-10-CM

## 2021-07-05 LAB — COMPREHENSIVE METABOLIC PANEL
ALT: 10 U/L (ref 0–35)
AST: 16 U/L (ref 0–37)
Albumin: 4 g/dL (ref 3.5–5.2)
Alkaline Phosphatase: 44 U/L (ref 39–117)
BUN: 16 mg/dL (ref 6–23)
CO2: 27 mEq/L (ref 19–32)
Calcium: 9.1 mg/dL (ref 8.4–10.5)
Chloride: 103 mEq/L (ref 96–112)
Creatinine, Ser: 0.98 mg/dL (ref 0.40–1.20)
GFR: 52.75 mL/min — ABNORMAL LOW (ref >60.00)
Glucose, Bld: 92 mg/dL (ref 70–99)
Potassium: 4 mEq/L (ref 3.5–5.1)
Sodium: 138 mEq/L (ref 135–145)
Total Bilirubin: 0.5 mg/dL (ref 0.2–1.2)
Total Protein: 7.1 g/dL (ref 6.0–8.3)

## 2021-07-05 LAB — TSH: TSH: 1.99 u[IU]/mL (ref 0.35–5.50)

## 2021-07-05 MED ORDER — DEXLANSOPRAZOLE 30 MG PO CPDR: 1.0000 | DELAYED_RELEASE_CAPSULE | Freq: Every day | ORAL | 3 refills | Status: DC

## 2021-07-05 NOTE — Progress Notes (Signed)
Subjective:  Patient ID: Joyce Riley, female    DOB: 14-May-1934  Age: 87 y.o. MRN: [id number]  CC: Follow-up (4 month f/u)   HPI Joyce Riley presents for osteoporosis, CKD, B12 def, GERD f/u  Outpatient Medications Prior to Visit  Medication Sig Dispense Refill   calcium-vitamin D (OSCAL WITH D) 500-200 MG-UNIT tablet Take 1 tablet by mouth 2 (two) times daily. 100 tablet 3   Cholecalciferol 1000 UNITS tablet Take 1 tablet (1,000 Units total) by mouth daily. 100 tablet 3   cyanocobalamin 500 MCG tablet Take 2 tablets (1,000 mcg total) by mouth daily. 100 tablet 3   diclofenac Sodium (VOLTAREN) 1 % GEL Apply 2 g topically 4 (four) times daily. To the L hip are 100 g 3   loperamide (IMODIUM A-D) 2 MG tablet Take 1-2 tablets (2-4 mg total) by mouth 4 (four) times daily as needed for diarrhea or loose stools. 60 tablet 1   Magnesium 250 MG TABS Take 1 tablet (250 mg total) by mouth daily. 100 tablet 3   triamcinolone ointment (KENALOG) 0.5 % Apply 1 application topically 2 (two) times daily. 90 g 3   Dexlansoprazole (DEXILANT) 30 MG capsule Take 1 capsule (30 mg total) by mouth daily. 90 capsule 3   No facility-administered medications prior to visit.    ROS: Review of Systems  Constitutional:  Negative for activity change, appetite change, chills, fatigue and unexpected weight change.  HENT:  Negative for congestion, mouth sores and sinus pressure.   Eyes:  Negative for visual disturbance.  Respiratory:  Negative for cough and chest tightness.   Gastrointestinal:  Negative for abdominal pain and nausea.  Genitourinary:  Negative for difficulty urinating, frequency and vaginal pain.  Musculoskeletal:  Positive for arthralgias. Negative for back pain and gait problem.  Skin:  Negative for pallor and rash.  Neurological:  Negative for dizziness, tremors, weakness, numbness and headaches.  Psychiatric/Behavioral:  Negative for confusion and sleep disturbance.    Objective:  BP  132/70 (BP Location: Left Arm)   Pulse 64   Temp 98.1 F (36.7 C) (Oral)   Ht 4\' 6"  (1.372 m)   Wt 121 lb 12.8 oz (55.2 kg)   SpO2 95%   BMI 29.37 kg/m   BP Readings from Last 3 Encounters:  07/05/21 132/70  04/27/21 138/72  02/23/21 120/68    Wt Readings from Last 3 Encounters:  07/05/21 121 lb 12.8 oz (55.2 kg)  04/27/21 119 lb (54 kg)  02/23/21 119 lb (54 kg)    Physical Exam Constitutional:      General: She is not in acute distress.    Appearance: She is well-developed.  HENT:     Head: Normocephalic.     Right Ear: External ear normal.     Left Ear: External ear normal.     Nose: Nose normal.  Eyes:     General:        Right eye: No discharge.        Left eye: No discharge.     Conjunctiva/sclera: Conjunctivae normal.     Pupils: Pupils are equal, round, and reactive to light.  Neck:     Thyroid: No thyromegaly.     Vascular: No JVD.     Trachea: No tracheal deviation.  Cardiovascular:     Rate and Rhythm: Normal rate and regular rhythm.     Heart sounds: Normal heart sounds.  Pulmonary:     Effort: No respiratory distress.     Breath  sounds: No stridor. No wheezing.  Abdominal:     General: Bowel sounds are normal. There is no distension.     Palpations: Abdomen is soft. There is no mass.     Tenderness: There is no abdominal tenderness. There is no guarding or rebound.  Musculoskeletal:        General: No tenderness.     Cervical back: Normal range of motion and neck supple. No rigidity.  Lymphadenopathy:     Cervical: No cervical adenopathy.  Skin:    Findings: No erythema or rash.  Neurological:     Cranial Nerves: No cranial nerve deficit.     Motor: No abnormal muscle tone.     Coordination: Coordination normal.     Deep Tendon Reflexes: Reflexes normal.  Psychiatric:        Behavior: Behavior normal.        Thought Content: Thought content normal.        Judgment: Judgment normal.    Lab Results  Component Value Date   WBC 5.1  11/11/2018   HGB 12.0 11/11/2018   HCT 35.6 (L) 11/11/2018   PLT 231.0 11/11/2018   GLUCOSE 86 02/23/2021   CHOL 219 (H) 11/11/2018   TRIG 128.0 11/11/2018   HDL 50.30 11/11/2018   LDLDIRECT 196.7 12/22/2010   LDLCALC 143 (H) 11/11/2018   ALT 13 10/26/2020   AST 16 10/26/2020   NA 141 02/23/2021   K 4.3 02/23/2021   CL 105 02/23/2021   CREATININE 0.86 02/23/2021   BUN 16 02/23/2021   CO2 28 02/23/2021   TSH 1.33 10/26/2020   INR 0.96 03/24/2010   HGBA1C 5.7 04/26/2010    No results found.  Assessment & Plan:   Problem List Items Addressed This Visit     B12 deficiency    Cont w/B complex po      CKD (chronic kidney disease) stage 3, GFR 30-59 ml/min (HCC)    Hydrate well daily Check CMET      Relevant Orders   Comprehensive metabolic panel   TSH   Dyslipidemia - Primary   Relevant Orders   Comprehensive metabolic panel   TSH   GERD     Cont w/Dexilant - risks discussed        Relevant Medications   Dexlansoprazole (DEXILANT) 30 MG capsule   Osteoporosis    Recent BDS - no change in the wrist Will start Prolia.  Potential benefits of a long term PROLIA use as well as potential risks  and complications were explained to the patient and were aknowledged.   Vit D      Relevant Orders   Comprehensive metabolic panel   TSH   Vitamin D deficiency    On Vit D         Follow-up: Return in about 3 months (around 10/05/2021) for a follow-up visit.  Walker Kehr, MD

## 2021-07-05 NOTE — Assessment & Plan Note (Signed)
Cont w/B complex po

## 2021-07-05 NOTE — Assessment & Plan Note (Addendum)
Recent BDS - no change in the wrist Will start Prolia.  Potential benefits of a long term PROLIA use as well as potential risks  and complications were explained to the patient and were aknowledged.   Vit D

## 2021-07-05 NOTE — Telephone Encounter (Signed)
Joyce Riley, Can we set her up for Prolia, please? Pls contact her dtr [name]. Thx

## 2021-07-05 NOTE — Assessment & Plan Note (Signed)
On Vit D 

## 2021-07-05 NOTE — Assessment & Plan Note (Signed)
Hydrate well daily Check CMET

## 2021-07-05 NOTE — Addendum Note (Signed)
Addended by: Earnstine Regal on: 07/05/2021 01:37 PM   Modules accepted: Orders

## 2021-07-05 NOTE — Addendum Note (Signed)
Addended by: Jacobo Forest on: 07/05/2021 11:47 AM   Modules accepted: Orders

## 2021-07-05 NOTE — Assessment & Plan Note (Signed)
Cont w/Dexilant - risks discussed

## 2021-08-23 DIAGNOSIS — H35363 Drusen (degenerative) of macula, bilateral: Secondary | ICD-10-CM | POA: Diagnosis not present

## 2021-08-23 DIAGNOSIS — H35373 Puckering of macula, bilateral: Secondary | ICD-10-CM | POA: Diagnosis not present

## 2021-08-23 DIAGNOSIS — H40023 Open angle with borderline findings, high risk, bilateral: Secondary | ICD-10-CM | POA: Diagnosis not present

## 2021-08-23 DIAGNOSIS — H35013 Changes in retinal vascular appearance, bilateral: Secondary | ICD-10-CM | POA: Diagnosis not present

## 2021-10-05 ENCOUNTER — Other Ambulatory Visit: Payer: Self-pay

## 2021-10-05 ENCOUNTER — Encounter: Payer: Self-pay | Admitting: Internal Medicine

## 2021-10-05 ENCOUNTER — Ambulatory Visit (INDEPENDENT_AMBULATORY_CARE_PROVIDER_SITE_OTHER): Payer: Medicare Other | Admitting: Internal Medicine

## 2021-10-05 ENCOUNTER — Telehealth: Payer: Self-pay

## 2021-10-05 VITALS — BP 148/84 | HR 62 | Temp 98.8°F | Ht 59.0 in | Wt 124.4 lb

## 2021-10-05 DIAGNOSIS — N1831 Chronic kidney disease, stage 3a: Secondary | ICD-10-CM | POA: Diagnosis not present

## 2021-10-05 DIAGNOSIS — M810 Age-related osteoporosis without current pathological fracture: Secondary | ICD-10-CM

## 2021-10-05 DIAGNOSIS — E538 Deficiency of other specified B group vitamins: Secondary | ICD-10-CM | POA: Diagnosis not present

## 2021-10-05 DIAGNOSIS — E785 Hyperlipidemia, unspecified: Secondary | ICD-10-CM

## 2021-10-05 LAB — COMPREHENSIVE METABOLIC PANEL
ALT: 12 U/L (ref 0–35)
AST: 16 U/L (ref 0–37)
Albumin: 4.1 g/dL (ref 3.5–5.2)
Alkaline Phosphatase: 52 U/L (ref 39–117)
BUN: 17 mg/dL (ref 6–23)
CO2: 30 mEq/L (ref 19–32)
Calcium: 9.2 mg/dL (ref 8.4–10.5)
Chloride: 101 mEq/L (ref 96–112)
Creatinine, Ser: 1.09 mg/dL (ref 0.40–1.20)
GFR: 46.35 mL/min — ABNORMAL LOW (ref >60.00)
Glucose, Bld: 90 mg/dL (ref 70–99)
Potassium: 4.3 mEq/L (ref 3.5–5.1)
Sodium: 138 mEq/L (ref 135–145)
Total Bilirubin: 0.5 mg/dL (ref 0.2–1.2)
Total Protein: 7.1 g/dL (ref 6.0–8.3)

## 2021-10-05 MED ORDER — MECLIZINE HCL 12.5 MG PO TABS: 12.5000 mg | ORAL_TABLET | Freq: Three times a day (TID) | ORAL | 2 refills | Status: DC | PRN

## 2021-10-05 MED ORDER — CYANOCOBALAMIN 1000 MCG/ML IJ SOLN
1000.0000 ug | Freq: Once | INTRAMUSCULAR | Status: AC
  Administered 2021-10-05: 1000 ug via INTRAMUSCULAR

## 2021-10-05 NOTE — Assessment & Plan Note (Signed)
Inj B12

## 2021-10-05 NOTE — Assessment & Plan Note (Signed)
On Prolia 

## 2021-10-05 NOTE — Assessment & Plan Note (Signed)
Check CMET. 

## 2021-10-05 NOTE — Progress Notes (Signed)
Subjective:  Patient ID: Joyce Riley, female    DOB: 1934/02/25  Age: 88 y.o. MRN: [id number]  CC: Follow-up (3 month f/u)   HPI Shawn Dannenberg presents for occasional vertigo complaint - recurrent for years. Meclizine used to help: she is out. F/u on Vit B12 def, HTN/CKD, GERD   Outpatient Medications Prior to Visit  Medication Sig Dispense Refill   calcium-vitamin D (OSCAL WITH D) 500-200 MG-UNIT tablet Take 1 tablet by mouth 2 (two) times daily. 100 tablet 3   Cholecalciferol 1000 UNITS tablet Take 1 tablet (1,000 Units total) by mouth daily. 100 tablet 3   cyanocobalamin 500 MCG tablet Take 2 tablets (1,000 mcg total) by mouth daily. 100 tablet 3   Dexlansoprazole (DEXILANT) 30 MG capsule Take 1 capsule (30 mg total) by mouth daily. 90 capsule 3   diclofenac Sodium (VOLTAREN) 1 % GEL Apply 2 g topically 4 (four) times daily. To the L hip are 100 g 3   loperamide (IMODIUM A-D) 2 MG tablet Take 1-2 tablets (2-4 mg total) by mouth 4 (four) times daily as needed for diarrhea or loose stools. 60 tablet 1   Magnesium 250 MG TABS Take 1 tablet (250 mg total) by mouth daily. 100 tablet 3   triamcinolone ointment (KENALOG) 0.5 % Apply 1 application topically 2 (two) times daily. 90 g 3   No facility-administered medications prior to visit.    ROS: Review of Systems  Constitutional:  Positive for fatigue. Negative for activity change, appetite change, chills and unexpected weight change.  HENT:  Positive for hearing loss. Negative for congestion, mouth sores, sinus pressure and tinnitus.   Eyes:  Negative for visual disturbance.  Respiratory:  Negative for cough and chest tightness.   Gastrointestinal:  Positive for abdominal pain. Negative for nausea.  Genitourinary:  Negative for difficulty urinating, frequency and vaginal pain.  Musculoskeletal:  Positive for back pain. Negative for gait problem.  Skin:  Negative for pallor and rash.  Neurological:  Positive for dizziness.  Negative for tremors, weakness, numbness and headaches.  Psychiatric/Behavioral:  Negative for confusion and sleep disturbance.    Objective:  BP (!) 148/84 (BP Location: Left Arm)    Pulse 62    Temp 98.8 F (37.1 C) (Oral)    Ht 4\' 11"  (1.499 m)    Wt 124 lb 6.4 oz (56.4 kg)    SpO2 95%    BMI 25.13 kg/m   BP Readings from Last 3 Encounters:  10/05/21 (!) 148/84  07/05/21 132/70  04/27/21 138/72    Wt Readings from Last 3 Encounters:  10/05/21 124 lb 6.4 oz (56.4 kg)  07/05/21 121 lb 12.8 oz (55.2 kg)  04/27/21 119 lb (54 kg)    Physical Exam Constitutional:      General: She is not in acute distress.    Appearance: She is well-developed.  HENT:     Head: Normocephalic.     Right Ear: External ear normal.     Left Ear: External ear normal.     Nose: Nose normal.  Eyes:     General:        Right eye: No discharge.        Left eye: No discharge.     Conjunctiva/sclera: Conjunctivae normal.     Pupils: Pupils are equal, round, and reactive to light.  Neck:     Thyroid: No thyromegaly.     Vascular: No JVD.     Trachea: No tracheal deviation.  Cardiovascular:  Rate and Rhythm: Normal rate and regular rhythm.     Heart sounds: Normal heart sounds.  Pulmonary:     Effort: No respiratory distress.     Breath sounds: No stridor. No wheezing.  Abdominal:     General: Bowel sounds are normal. There is no distension.     Palpations: Abdomen is soft. There is no mass.     Tenderness: There is no abdominal tenderness. There is no guarding or rebound.  Musculoskeletal:        General: No tenderness.     Cervical back: Normal range of motion and neck supple. No rigidity.  Lymphadenopathy:     Cervical: No cervical adenopathy.  Skin:    Findings: No erythema or rash.  Neurological:     Cranial Nerves: No cranial nerve deficit.     Motor: No abnormal muscle tone.     Coordination: Coordination normal.     Deep Tendon Reflexes: Reflexes normal.  Psychiatric:         Behavior: Behavior normal.        Thought Content: Thought content normal.        Judgment: Judgment normal.    Lab Results  Component Value Date   WBC 5.1 11/11/2018   HGB 12.0 11/11/2018   HCT 35.6 (L) 11/11/2018   PLT 231.0 11/11/2018   GLUCOSE 90 10/05/2021   CHOL 219 (H) 11/11/2018   TRIG 128.0 11/11/2018   HDL 50.30 11/11/2018   LDLDIRECT 196.7 12/22/2010   LDLCALC 143 (H) 11/11/2018   ALT 12 10/05/2021   AST 16 10/05/2021   NA 138 10/05/2021   K 4.3 10/05/2021   CL 101 10/05/2021   CREATININE 1.09 10/05/2021   BUN 17 10/05/2021   CO2 30 10/05/2021   TSH 1.99 07/05/2021   INR 0.96 03/24/2010   HGBA1C 5.7 04/26/2010    No results found.  Assessment & Plan:   Problem List Items Addressed This Visit     B12 deficiency - Primary    Inj B12      CKD (chronic kidney disease) stage 3, GFR 30-59 ml/min (HCC)    Check CMET      Dyslipidemia   Relevant Orders   Comprehensive metabolic panel (Completed)   Osteoporosis    On Prolia      Relevant Orders   Comprehensive metabolic panel (Completed)      Meds ordered this encounter  Medications   meclizine (ANTIVERT) 12.5 MG tablet    Sig: Take 1 tablet (12.5 mg total) by mouth 3 (three) times daily as needed for dizziness or nausea.    Dispense:  60 tablet    Refill:  2   cyanocobalamin ((VITAMIN B-12)) injection 1,000 mcg      Follow-up: Return in about 3 months (around 01/03/2022) for a follow-up visit.  Walker Kehr, MD

## 2021-10-05 NOTE — Telephone Encounter (Signed)
PA has been approved for Meclizine HCl 12.5MG  tablets.   Key: [id number]

## 2021-10-08 ENCOUNTER — Encounter: Payer: Self-pay | Admitting: Internal Medicine

## 2021-11-02 ENCOUNTER — Ambulatory Visit (INDEPENDENT_AMBULATORY_CARE_PROVIDER_SITE_OTHER): Payer: Medicare Other

## 2021-11-02 ENCOUNTER — Other Ambulatory Visit: Payer: Self-pay

## 2021-11-02 VITALS — BP 120/70 | HR 77 | Temp 98.2°F | Ht 59.0 in | Wt 125.6 lb

## 2021-11-02 DIAGNOSIS — Z0000 Encounter for general adult medical examination without abnormal findings: Secondary | ICD-10-CM
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
## 2021-11-02 NOTE — Patient Instructions (Signed)
[name] , Thank you for taking time to come for your Medicare Wellness Visit. I appreciate your ongoing commitment to your health goals. Please review the following plan we discussed and let me know if I can assist you in the future.   Screening recommendations/referrals: Colonoscopy: Not a candidate for screening due to age 88: 02/06/2021; due every year Bone Density: 05/16/2021; due every 2-3 years Recommended yearly ophthalmology/optometry visit for glaucoma screening and checkup Recommended yearly dental visit for hygiene and checkup  Vaccinations: Influenza vaccine: 07/05/2021 Pneumococcal vaccine: 09/15/2013, 11/02/2016 Tdap vaccine: 03/21/2015; due every 10 years Shingles vaccine: 06/22/2017; need second dose date Covid-19: 10/17/2019, 11/07/2019, 06/21/2020, 01/17/2021, 06/06/2021  Advanced directives: Advance directive discussed with you today. Even though you declined this today please call our office should you change your mind and we can give you the proper paperwork for you to fill out.  Conditions/risks identified: Yes; my goal is to be in less pain with my back.  Next appointment: Please schedule your next Medicare Wellness Visit with your Nurse Health Advisor in 1 year by calling [phone].   Preventive Care 30 Years and Older, Female Preventive care refers to lifestyle choices and visits with your health care provider that can promote health and wellness. What does preventive care include? A yearly physical exam. This is also called an annual well check. Dental exams once or twice a year. Routine eye exams. Ask your health care provider how often you should have your eyes checked. Personal lifestyle choices, including: Daily care of your teeth and gums. Regular physical activity. Eating a healthy diet. Avoiding tobacco and drug use. Limiting alcohol use. Practicing safe sex. Taking low-dose aspirin every day. Taking vitamin and mineral supplements as  recommended by your health care provider. What happens during an annual well check? The services and screenings done by your health care provider during your annual well check will depend on your age, overall health, lifestyle risk factors, and family history of disease. Counseling  Your health care provider may ask you questions about your: Alcohol use. Tobacco use. Drug use. Emotional well-being. Home and relationship well-being. Sexual activity. Eating habits. History of falls. Memory and ability to understand (cognition). Work and work Statistician. Reproductive health. Screening  You may have the following tests or measurements: Height, weight, and BMI. Blood pressure. Lipid and cholesterol levels. These may be checked every 5 years, or more frequently if you are over 29 years old. Skin check. Lung cancer screening. You may have this screening every year starting at age 37 if you have a 30-pack-year history of smoking and currently smoke or have quit within the past 15 years. Fecal occult blood test (FOBT) of the stool. You may have this test every year starting at age 27. Flexible sigmoidoscopy or colonoscopy. You may have a sigmoidoscopy every 5 years or a colonoscopy every 10 years starting at age 9. Hepatitis C blood test. Hepatitis B blood test. Sexually transmitted disease (STD) testing. Diabetes screening. This is done by checking your blood sugar (glucose) after you have not eaten for a while (fasting). You may have this done every 1-3 years. Bone density scan. This is done to screen for osteoporosis. You may have this done starting at age 53. Mammogram. This may be done every 1-2 years. Talk to your health care provider about how often you should have regular mammograms. Talk with your health care provider about your test results, treatment options, and if necessary, the need for more tests. Vaccines  Your  health care provider may recommend certain vaccines, such  as: Influenza vaccine. This is recommended every year. Tetanus, diphtheria, and acellular pertussis (Tdap, Td) vaccine. You may need a Td booster every 10 years. Zoster vaccine. You may need this after age 13. Pneumococcal 13-valent conjugate (PCV13) vaccine. One dose is recommended after age 81. Pneumococcal polysaccharide (PPSV23) vaccine. One dose is recommended after age 67. Talk to your health care provider about which screenings and vaccines you need and how often you need them. This information is not intended to replace advice given to you by your health care provider. Make sure you discuss any questions you have with your health care provider. Document Released: 10/07/2015 Document Revised: 05/30/2016 Document Reviewed: 07/12/2015 Elsevier Interactive Patient Education  2017 [employer] Prevention in the Home Falls can cause injuries. They can happen to people of all ages. There are many things you can do to make your home safe and to help prevent falls. What can I do on the outside of my home? Regularly fix the edges of walkways and driveways and fix any cracks. Remove anything that might make you trip as you walk through a door, such as a raised step or threshold. Trim any bushes or trees on the path to your home. Use bright outdoor lighting. Clear any walking paths of anything that might make someone trip, such as rocks or tools. Regularly check to see if handrails are loose or broken. Make sure that both sides of any steps have handrails. Any raised decks and porches should have guardrails on the edges. Have any leaves, snow, or ice cleared regularly. Use sand or salt on walking paths during winter. Clean up any spills in your garage right away. This includes oil or grease spills. What can I do in the bathroom? Use night lights. Install grab bars by the toilet and in the tub and shower. Do not use towel bars as grab bars. Use non-skid mats or decals in the tub or  shower. If you need to sit down in the shower, use a plastic, non-slip stool. Keep the floor dry. Clean up any water that spills on the floor as soon as it happens. Remove soap buildup in the tub or shower regularly. Attach bath mats securely with double-sided non-slip rug tape. Do not have throw rugs and other things on the floor that can make you trip. What can I do in the bedroom? Use night lights. Make sure that you have a light by your bed that is easy to reach. Do not use any sheets or blankets that are too big for your bed. They should not hang down onto the floor. Have a firm chair that has side arms. You can use this for support while you get dressed. Do not have throw rugs and other things on the floor that can make you trip. What can I do in the kitchen? Clean up any spills right away. Avoid walking on wet floors. Keep items that you use a lot in easy-to-reach places. If you need to reach something above you, use a strong step stool that has a grab bar. Keep electrical cords out of the way. Do not use floor polish or wax that makes floors slippery. If you must use wax, use non-skid floor wax. Do not have throw rugs and other things on the floor that can make you trip. What can I do with my stairs? Do not leave any items on the stairs. Make sure that there are handrails  on both sides of the stairs and use them. Fix handrails that are broken or loose. Make sure that handrails are as long as the stairways. Check any carpeting to make sure that it is firmly attached to the stairs. Fix any carpet that is loose or worn. Avoid having throw rugs at the top or bottom of the stairs. If you do have throw rugs, attach them to the floor with carpet tape. Make sure that you have a light switch at the top of the stairs and the bottom of the stairs. If you do not have them, ask someone to add them for you. What else can I do to help prevent falls? Wear shoes that: Do not have high heels. Have  rubber bottoms. Are comfortable and fit you well. Are closed at the toe. Do not wear sandals. If you use a stepladder: Make sure that it is fully opened. Do not climb a closed stepladder. Make sure that both sides of the stepladder are locked into place. Ask someone to hold it for you, if possible. Clearly mark and make sure that you can see: Any grab bars or handrails. First and last steps. Where the edge of each step is. Use tools that help you move around (mobility aids) if they are needed. These include: Canes. Walkers. Scooters. Crutches. Turn on the lights when you go into a dark area. Replace any light bulbs as soon as they burn out. Set up your furniture so you have a clear path. Avoid moving your furniture around. If any of your floors are uneven, fix them. If there are any pets around you, be aware of where they are. Review your medicines with your doctor. Some medicines can make you feel dizzy. This can increase your chance of falling. Ask your doctor what other things that you can do to help prevent falls. This information is not intended to replace advice given to you by your health care provider. Make sure you discuss any questions you have with your health care provider. Document Released: 07/07/2009 Document Revised: 02/16/2016 Document Reviewed: 10/15/2014 Elsevier Interactive Patient Education  2017 [employer].

## 2021-11-05 ENCOUNTER — Other Ambulatory Visit: Payer: Self-pay | Admitting: Internal Medicine

## 2021-11-05 DIAGNOSIS — R197 Diarrhea, unspecified: Secondary | ICD-10-CM

## 2021-12-27 DIAGNOSIS — H40023 Open angle with borderline findings, high risk, bilateral: Secondary | ICD-10-CM | POA: Diagnosis not present

## 2022-01-02 ENCOUNTER — Other Ambulatory Visit: Payer: Self-pay | Admitting: Internal Medicine

## 2022-01-02 DIAGNOSIS — Z1231 Encounter for screening mammogram for malignant neoplasm of breast: Secondary | ICD-10-CM

## 2022-01-10 ENCOUNTER — Ambulatory Visit (INDEPENDENT_AMBULATORY_CARE_PROVIDER_SITE_OTHER): Payer: Medicare Other | Admitting: Internal Medicine

## 2022-01-10 ENCOUNTER — Encounter: Payer: Self-pay | Admitting: Internal Medicine

## 2022-01-10 DIAGNOSIS — M810 Age-related osteoporosis without current pathological fracture: Secondary | ICD-10-CM

## 2022-01-10 DIAGNOSIS — M25511 Pain in right shoulder: Secondary | ICD-10-CM | POA: Diagnosis not present

## 2022-01-10 DIAGNOSIS — G8929 Other chronic pain: Secondary | ICD-10-CM | POA: Diagnosis not present

## 2022-01-10 DIAGNOSIS — R42 Dizziness and giddiness: Secondary | ICD-10-CM

## 2022-01-10 DIAGNOSIS — R634 Abnormal weight loss: Secondary | ICD-10-CM

## 2022-01-10 DIAGNOSIS — E559 Vitamin D deficiency, unspecified: Secondary | ICD-10-CM | POA: Diagnosis not present

## 2022-01-10 DIAGNOSIS — R413 Other amnesia: Secondary | ICD-10-CM | POA: Diagnosis not present

## 2022-01-10 DIAGNOSIS — E538 Deficiency of other specified B group vitamins: Secondary | ICD-10-CM

## 2022-01-10 DIAGNOSIS — N1831 Chronic kidney disease, stage 3a: Secondary | ICD-10-CM | POA: Diagnosis not present

## 2022-01-10 DIAGNOSIS — R197 Diarrhea, unspecified: Secondary | ICD-10-CM

## 2022-01-10 MED ORDER — DENOSUMAB 60 MG/ML ~~LOC~~ SOSY
60.0000 mg | PREFILLED_SYRINGE | Freq: Once | SUBCUTANEOUS | Status: AC
  Administered 2022-01-10: 60 mg via SUBCUTANEOUS

## 2022-01-10 MED ORDER — MECLIZINE HCL 12.5 MG PO TABS: 12.5000 mg | ORAL_TABLET | Freq: Three times a day (TID) | ORAL | 2 refills | Status: DC | PRN

## 2022-01-10 NOTE — Assessment & Plan Note (Signed)
R shoulder recurrent pain ?Use Voltaren gel ?

## 2022-01-10 NOTE — Assessment & Plan Note (Signed)
On B12 

## 2022-01-10 NOTE — Assessment & Plan Note (Signed)
Renal US to r/o obstruction ?Hydrate well daily ?

## 2022-01-10 NOTE — Assessment & Plan Note (Signed)
Wt Readings from Last 3 Encounters:  ?01/10/22 128 lb (58.1 kg)  ?11/02/21 125 lb 9.6 oz (57 kg)  ?10/05/21 124 lb 6.4 oz (56.4 kg)  ? ? ?

## 2022-01-10 NOTE — Assessment & Plan Note (Signed)
Recurrent BPV ?Use Meclizine prn ?

## 2022-01-10 NOTE — Addendum Note (Signed)
Addended by: Max Sane on: 01/10/2022 11:55 AM ? ? Modules accepted: Orders ? ?

## 2022-01-10 NOTE — Progress Notes (Signed)
? ?Subjective:  ?Patient ID: Joyce Riley, female    DOB: [birth year]  Age: 88 y.o. MRN: [id number] ? ?CC: Follow-up ? ? ?HPI ?Joyce Riley presents for chronic loose stools, vertigo, B12 def, osteoporosis ? ?C/o shoulder pain R recurrent pain ? ?Outpatient Medications Prior to Visit  ?Medication Sig Dispense Refill  ? B-Complex CAPS Take by mouth.    ? calcium-vitamin D (OSCAL WITH D) 500-200 MG-UNIT tablet Take 1 tablet by mouth 2 (two) times daily. 100 tablet 3  ? Cholecalciferol 1000 UNITS tablet Take 1 tablet (1,000 Units total) by mouth daily. 100 tablet 3  ? Dexlansoprazole (DEXILANT) 30 MG capsule Take 1 capsule (30 mg total) by mouth daily. 90 capsule 3  ? diclofenac Sodium (VOLTAREN) 1 % GEL Apply 2 g topically 4 (four) times daily. To the L hip are 100 g 3  ? loperamide (IMODIUM A-D) 2 MG tablet Take 1-2 tablets (2-4 mg total) by mouth 4 (four) times daily as needed for diarrhea or loose stools. 60 tablet 1  ? Magnesium 250 MG TABS Take 1 tablet (250 mg total) by mouth daily. 100 tablet 3  ? triamcinolone ointment (KENALOG) 0.5 % Apply 1 application topically 2 (two) times daily. 90 g 3  ? cyanocobalamin 500 MCG tablet Take 2 tablets (1,000 mcg total) by mouth daily. 100 tablet 3  ? meclizine (ANTIVERT) 12.5 MG tablet Take 1 tablet (12.5 mg total) by mouth 3 (three) times daily as needed for dizziness or nausea. 60 tablet 2  ? ?No facility-administered medications prior to visit.  ? ? ?ROS: ?Review of Systems  ?Constitutional:  Positive for fatigue. Negative for activity change, appetite change, chills and unexpected weight change.  ?HENT:  Positive for hearing loss. Negative for congestion, mouth sores, sinus pressure and tinnitus.   ?Eyes:  Negative for visual disturbance.  ?Respiratory:  Negative for cough and chest tightness.   ?Gastrointestinal:  Positive for diarrhea. Negative for abdominal pain and nausea.  ?Endocrine: Negative for polyuria.  ?Genitourinary:  Negative for difficulty urinating,  frequency and vaginal pain.  ?Musculoskeletal:  Negative for back pain and gait problem.  ?Skin:  Negative for pallor and rash.  ?Neurological:  Positive for dizziness. Negative for tremors, syncope, weakness, light-headedness, numbness and headaches.  ?Psychiatric/Behavioral:  Negative for confusion and sleep disturbance.   ? ?Objective:  ?BP 124/70 (BP Location: Right Arm, Patient Position: Sitting, Cuff Size: Large)   Pulse 94   Temp 98.3 ?F (36.8 ?C) (Oral)   Ht '4\' 11"'$  (1.499 m)   Wt 128 lb (58.1 kg)   SpO2 96%   BMI 25.85 kg/m?  ? ?BP Readings from Last 3 Encounters:  ?01/10/22 124/70  ?11/02/21 120/70  ?10/05/21 (!) 148/84  ? ? ?Wt Readings from Last 3 Encounters:  ?01/10/22 128 lb (58.1 kg)  ?11/02/21 125 lb 9.6 oz (57 kg)  ?10/05/21 124 lb 6.4 oz (56.4 kg)  ? ? ?Physical Exam ?Constitutional:   ?   General: She is not in acute distress. ?   Appearance: She is well-developed.  ?HENT:  ?   Head: Normocephalic.  ?   Right Ear: External ear normal.  ?   Left Ear: External ear normal.  ?   Nose: Nose normal.  ?Eyes:  ?   General:     ?   Right eye: No discharge.     ?   Left eye: No discharge.  ?   Conjunctiva/sclera: Conjunctivae normal.  ?   Pupils: Pupils are equal, round, and  reactive to light.  ?Neck:  ?   Thyroid: No thyromegaly.  ?   Vascular: No JVD.  ?   Trachea: No tracheal deviation.  ?Cardiovascular:  ?   Rate and Rhythm: Normal rate and regular rhythm.  ?   Heart sounds: Normal heart sounds.  ?Pulmonary:  ?   Effort: No respiratory distress.  ?   Breath sounds: No stridor. No wheezing.  ?Abdominal:  ?   General: Bowel sounds are normal. There is no distension.  ?   Palpations: Abdomen is soft. There is no mass.  ?   Tenderness: There is no abdominal tenderness. There is no guarding or rebound.  ?Musculoskeletal:     ?   General: No tenderness.  ?   Cervical back: Normal range of motion and neck supple. No rigidity.  ?Lymphadenopathy:  ?   Cervical: No cervical adenopathy.  ?Skin: ?   Findings:  No erythema or rash.  ?Neurological:  ?   Cranial Nerves: No cranial nerve deficit.  ?   Motor: No abnormal muscle tone.  ?   Coordination: Coordination normal.  ?   Deep Tendon Reflexes: Reflexes normal.  ?Psychiatric:     ?   Behavior: Behavior normal.     ?   Thought Content: Thought content normal.     ?   Judgment: Judgment normal.  ?Hard hearing ? ? ?Lab Results  ?Component Value Date  ? WBC 5.1 11/11/2018  ? HGB 12.0 11/11/2018  ? HCT 35.6 (L) 11/11/2018  ? PLT 231.0 11/11/2018  ? GLUCOSE 90 10/05/2021  ? CHOL 219 (H) 11/11/2018  ? TRIG 128.0 11/11/2018  ? HDL 50.30 11/11/2018  ? LDLDIRECT 196.7 12/22/2010  ? LDLCALC 143 (H) 11/11/2018  ? ALT 12 10/05/2021  ? AST 16 10/05/2021  ? NA 138 10/05/2021  ? K 4.3 10/05/2021  ? CL 101 10/05/2021  ? CREATININE 1.09 10/05/2021  ? BUN 17 10/05/2021  ? CO2 30 10/05/2021  ? TSH 1.99 07/05/2021  ? INR 0.96 03/24/2010  ? HGBA1C 5.7 04/26/2010  ? ? ?No results found. ? ?Assessment & Plan:  ? ?Problem List Items Addressed This Visit   ? ? B12 deficiency  ?  On B12 ? ?  ?  ? Vitamin D deficiency  ?  On Vit D ? ?  ?  ? Shoulder pain, right  ?  R shoulder recurrent pain ?Use Voltaren gel ?  ?  ? VERTIGO  ?  Recurrent BPV ?Use Meclizine prn ? ?  ?  ? Relevant Medications  ? meclizine (ANTIVERT) 12.5 MG tablet  ? Osteoporosis  ?  Due Prolia shot ? ?  ?  ? Diarrhea  ?  Imodium prn ?Lactose intolerance ? ?  ?  ? CKD (chronic kidney disease) stage 3, GFR 30-59 ml/min ([facility])  ?  Renal US to r/o obstruction ?Hydrate well daily ? ?  ?  ? Weight loss  ?  Wt Readings from Last 3 Encounters:  ?01/10/22 128 lb (58.1 kg)  ?11/02/21 125 lb 9.6 oz (57 kg)  ?10/05/21 124 lb 6.4 oz (56.4 kg)  ? ?  ?  ? Memory loss  ?  On Lion's mane. On B complex ?Doing well. Stable mild cognitive deficit. ? ?  ?  ?  ? ? ?Meds ordered this encounter  ?Medications  ? meclizine (ANTIVERT) 12.5 MG tablet  ?  Sig: Take 1-2 tablets (12.5-25 mg total) by mouth 3 (three) times daily as needed for dizziness or nausea.  ?  Dispense:  60 tablet  ?  Refill:  2  ?  ? ? ?Follow-up: Return in about 4 months (around 05/12/2022) for a follow-up visit. ? ?Walker Kehr, MD ?

## 2022-01-10 NOTE — Assessment & Plan Note (Signed)
Due Prolia shot ?

## 2022-01-10 NOTE — Assessment & Plan Note (Signed)
On Vit D 

## 2022-01-10 NOTE — Assessment & Plan Note (Addendum)
On Lion's mane. On B complex ?Doing well. Stable mild cognitive deficit. ?

## 2022-01-10 NOTE — Assessment & Plan Note (Signed)
Imodium prn ?Lactose intolerance ?

## 2022-02-07 ENCOUNTER — Ambulatory Visit
Admission: RE | Admit: 2022-02-07 | Discharge: 2022-02-07 | Disposition: A | Discharge status: Home / Self Care | Payer: Medicare Other | Source: Ambulatory Visit | Attending: Internal Medicine | Admitting: Internal Medicine

## 2022-02-07 DIAGNOSIS — Z1231 Encounter for screening mammogram for malignant neoplasm of breast: Secondary | ICD-10-CM | POA: Diagnosis not present

## 2022-02-08 DIAGNOSIS — Z23 Encounter for immunization: Secondary | ICD-10-CM | POA: Diagnosis not present

## 2022-04-12 ENCOUNTER — Telehealth: Payer: Self-pay | Admitting: Internal Medicine

## 2022-04-12 DIAGNOSIS — M79671 Pain in right foot: Secondary | ICD-10-CM

## 2022-04-12 NOTE — Telephone Encounter (Signed)
PT's contact calls today in need of a referral to an orthopedic doctor. PT is currently experiencing issues with her toes (toes folding on top of one another). PT's contact discussed that she would prefer this referral be made for the Orthopaedic office on [facility] (as it is closer to her transportation wise.)

## 2022-04-13 NOTE — Telephone Encounter (Signed)
She needs to see a podiatrist.  I will refer.  Thanks

## 2022-04-13 NOTE — Telephone Encounter (Signed)
Called pt no answer LMOM w/MD response../lmb 

## 2022-04-26 ENCOUNTER — Ambulatory Visit (INDEPENDENT_AMBULATORY_CARE_PROVIDER_SITE_OTHER): Payer: Medicare Other | Admitting: Podiatry

## 2022-04-26 ENCOUNTER — Encounter: Payer: Self-pay | Admitting: Podiatry

## 2022-04-26 DIAGNOSIS — M79675 Pain in left toe(s): Secondary | ICD-10-CM | POA: Diagnosis not present

## 2022-04-26 DIAGNOSIS — M2011 Hallux valgus (acquired), right foot: Secondary | ICD-10-CM | POA: Diagnosis not present

## 2022-04-26 DIAGNOSIS — R609 Edema, unspecified: Secondary | ICD-10-CM

## 2022-04-26 DIAGNOSIS — M79674 Pain in right toe(s): Secondary | ICD-10-CM | POA: Diagnosis not present

## 2022-04-26 DIAGNOSIS — M21611 Bunion of right foot: Secondary | ICD-10-CM | POA: Diagnosis not present

## 2022-04-26 DIAGNOSIS — M21612 Bunion of left foot: Secondary | ICD-10-CM | POA: Diagnosis not present

## 2022-04-26 DIAGNOSIS — B351 Tinea unguium: Secondary | ICD-10-CM

## 2022-04-26 DIAGNOSIS — K625 Hemorrhage of anus and rectum: Secondary | ICD-10-CM | POA: Insufficient documentation

## 2022-04-26 DIAGNOSIS — R141 Gas pain: Secondary | ICD-10-CM | POA: Insufficient documentation

## 2022-04-26 DIAGNOSIS — R152 Fecal urgency: Secondary | ICD-10-CM | POA: Insufficient documentation

## 2022-04-26 DIAGNOSIS — M2012 Hallux valgus (acquired), left foot: Secondary | ICD-10-CM

## 2022-04-29 NOTE — Progress Notes (Signed)
  Subjective:  Patient ID: Joyce Riley, female    DOB: 1933-10-29,  MRN: [id number]  Chief Complaint  Patient presents with   Bunions    NP- Second toe leaning on great toe. on R foot. L foot has cracks on bottom of foot. Redness and swelling. hard to walk. Been going on for a year.   Nail Problem    Thick painful toenails   Foot Swelling    Swelling in ankles, left > right   Spasms    Lower legs - tried magnesium, eating bananas    88 y.o. female presents with the above complaint. History confirmed with patient.  Translation is provided from [place] to [place] by Optometrist.  Objective:  Physical Exam: warm, good capillary refill, no trophic changes or ulcerative lesions, normal DP and PT pulses, normal sensory exam, and diffuse +1 pitting edema.  Bilateral hallux valgus xerosis cutis Left Foot: dystrophic yellowed discolored nail plates with subungual debris Right Foot: dystrophic yellowed discolored nail plates with subungual debris   Assessment:  No diagnosis found.   Plan:  Patient was evaluated and treated and all questions answered.  Discussed peripheral edema and strategies to mitigate swelling including elevating legs and wearing of compression stockings.  Discussed the etiology and treatment options for the condition in detail with the patient. Educated patient on the topical and oral treatment options for mycotic nails. Recommended debridement of the nails today. Sharp and mechanical debridement performed of all painful and mycotic nails today. Nails debrided in length and thickness using a nail nipper to level of comfort. Discussed treatment options including appropriate shoe gear. Follow up as needed for painful nails.   Recommend nonoperative treatment for her bunion deformities at her age  Foot hygiene and moisturizing lotions reviewed . Return in about 3 months (around 07/27/2022) for painful thick nails.
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
## 2022-05-10 ENCOUNTER — Encounter: Payer: Self-pay | Admitting: Obstetrics and Gynecology

## 2022-05-10 ENCOUNTER — Ambulatory Visit (INDEPENDENT_AMBULATORY_CARE_PROVIDER_SITE_OTHER): Payer: Medicare Other | Admitting: Obstetrics and Gynecology

## 2022-05-10 VITALS — BP 130/70 | HR 73 | Ht <= 58 in | Wt 128.0 lb

## 2022-05-10 DIAGNOSIS — N816 Rectocele: Secondary | ICD-10-CM

## 2022-05-10 DIAGNOSIS — Z01419 Encounter for gynecological examination (general) (routine) without abnormal findings: Secondary | ICD-10-CM | POA: Diagnosis not present

## 2022-05-10 NOTE — Patient Instructions (Signed)

## 2022-05-14 ENCOUNTER — Encounter: Payer: Self-pay | Admitting: Internal Medicine

## 2022-05-14 ENCOUNTER — Ambulatory Visit (INDEPENDENT_AMBULATORY_CARE_PROVIDER_SITE_OTHER): Payer: Medicare Other | Admitting: Internal Medicine

## 2022-05-14 DIAGNOSIS — E538 Deficiency of other specified B group vitamins: Secondary | ICD-10-CM

## 2022-05-14 DIAGNOSIS — R197 Diarrhea, unspecified: Secondary | ICD-10-CM

## 2022-05-14 DIAGNOSIS — N1831 Chronic kidney disease, stage 3a: Secondary | ICD-10-CM

## 2022-05-14 DIAGNOSIS — R413 Other amnesia: Secondary | ICD-10-CM | POA: Diagnosis not present

## 2022-05-14 DIAGNOSIS — M810 Age-related osteoporosis without current pathological fracture: Secondary | ICD-10-CM | POA: Diagnosis not present

## 2022-05-14 LAB — CBC WITH DIFFERENTIAL/PLATELET
Basophils Absolute: 0 10*3/uL (ref 0.0–0.1)
Basophils Relative: 0.5 % (ref 0.0–3.0)
Eosinophils Absolute: 0.1 10*3/uL (ref 0.0–0.7)
Eosinophils Relative: 1.4 % (ref 0.0–5.0)
HCT: 32.5 % — ABNORMAL LOW (ref 36.0–46.0)
Hemoglobin: 11 g/dL — ABNORMAL LOW (ref 12.0–15.0)
Lymphocytes Relative: 29.5 % (ref 12.0–46.0)
Lymphs Abs: 1.7 10*3/uL (ref 0.7–4.0)
MCHC: 34 g/dL (ref 30.0–36.0)
MCV: 88.6 fl (ref 78.0–100.0)
Monocytes Absolute: 0.6 10*3/uL (ref 0.1–1.0)
Monocytes Relative: 10.5 % (ref 3.0–12.0)
Neutro Abs: 3.4 10*3/uL (ref 1.4–7.7)
Neutrophils Relative %: 58.1 % (ref 43.0–77.0)
Platelets: 236 10*3/uL (ref 150.0–400.0)
RBC: 3.67 Mil/uL — ABNORMAL LOW (ref 3.87–5.11)
RDW: 12.7 % (ref 11.5–15.5)
WBC: 5.9 10*3/uL (ref 4.0–10.5)

## 2022-05-14 LAB — VITAMIN B12: Vitamin B-12: 678 pg/mL (ref 211–911)

## 2022-05-14 LAB — TSH: TSH: 1.94 u[IU]/mL (ref 0.35–5.50)

## 2022-05-14 LAB — COMPREHENSIVE METABOLIC PANEL
ALT: 8 U/L (ref 0–35)
AST: 12 U/L (ref 0–37)
Albumin: 3.9 g/dL (ref 3.5–5.2)
Alkaline Phosphatase: 45 U/L (ref 39–117)
BUN: 19 mg/dL (ref 6–23)
CO2: 26 mEq/L (ref 19–32)
Calcium: 8.8 mg/dL (ref 8.4–10.5)
Chloride: 101 mEq/L (ref 96–112)
Creatinine, Ser: 1.07 mg/dL (ref 0.40–1.20)
GFR: 47.19 mL/min — ABNORMAL LOW (ref >60.00)
Glucose, Bld: 83 mg/dL (ref 70–99)
Potassium: 3.9 mEq/L (ref 3.5–5.1)
Sodium: 139 mEq/L (ref 135–145)
Total Bilirubin: 0.4 mg/dL (ref 0.2–1.2)
Total Protein: 7.1 g/dL (ref 6.0–8.3)

## 2022-05-14 MED ORDER — DICLOFENAC SODIUM 1 % EX GEL: 2.0000 g | Freq: Four times a day (QID) | CUTANEOUS | 1 refills | Status: DC

## 2022-05-14 NOTE — Assessment & Plan Note (Signed)
Hydrate well daily. Monitor GFR 

## 2022-05-14 NOTE — Assessment & Plan Note (Signed)
Doing well. Stable mild cognitive deficit.

## 2022-05-14 NOTE — Assessment & Plan Note (Signed)
On Prolia She will tell her dentist about Prolia inj

## 2022-05-14 NOTE — Assessment & Plan Note (Signed)
On B12 

## 2022-05-14 NOTE — Progress Notes (Signed)
Subjective:  Patient ID: Joyce Riley, female    DOB: 1934-04-14  Age: 87 y.o. MRN: [id number]  CC: Follow-up (4 month f/u)   HPI Joyce Riley presents for GERD, osteoporosis, diarrhea. Joyce Riley had some dental work done recently, C/o fatigue  Outpatient Medications Prior to Visit  Medication Sig Dispense Refill   B-Complex CAPS Take by mouth.     calcium-vitamin D (OSCAL WITH D) 500-200 MG-UNIT tablet Take 1 tablet by mouth 2 (two) times daily. 100 tablet 3   chlorhexidine (PERIDEX) 0.12 % solution SMARTSIG:By Mouth     Cholecalciferol 1000 UNITS tablet Take 1 tablet (1,000 Units total) by mouth daily. 100 tablet 3   denosumab (PROLIA) 60 MG/ML SOSY injection Inject 60 mg into the skin every 6 (six) months.     Dexlansoprazole (DEXILANT) 30 MG capsule Take 1 capsule (30 mg total) by mouth daily. 90 capsule 3   doxycycline (VIBRAMYCIN) 100 MG capsule Take by mouth 2 (two) times daily.     loperamide (IMODIUM A-D) 2 MG tablet Take 1-2 tablets (2-4 mg total) by mouth 4 (four) times daily as needed for diarrhea or loose stools. 60 tablet 1   Magnesium 250 MG TABS Take 1 tablet (250 mg total) by mouth daily. 100 tablet 3   triamcinolone ointment (KENALOG) 0.5 % Apply 1 application topically 2 (two) times daily. 90 g 3   diclofenac Sodium (VOLTAREN) 1 % GEL Apply 2 g topically 4 (four) times daily. To the L hip are 100 g 3   No facility-administered medications prior to visit.    ROS: Review of Systems  Constitutional:  Negative for activity change, appetite change, chills, fatigue and unexpected weight change.  HENT:  Negative for congestion, mouth sores and sinus pressure.   Eyes:  Negative for visual disturbance.  Respiratory:  Negative for cough and chest tightness.   Gastrointestinal:  Positive for diarrhea. Negative for abdominal pain and nausea.  Genitourinary:  Negative for difficulty urinating, frequency and vaginal pain.  Musculoskeletal:  Positive for arthralgias.  Negative for back pain and gait problem.  Skin:  Negative for pallor and rash.  Neurological:  Negative for dizziness, tremors, weakness, numbness and headaches.  Psychiatric/Behavioral:  Negative for confusion and sleep disturbance.     Objective:  BP (!) 142/78 (BP Location: Left Arm)   Pulse 63   Temp 98.5 F (36.9 C) (Oral)   Wt 128 lb 6.4 oz (58.2 kg)   SpO2 96%   BMI 30.39 kg/m   BP Readings from Last 3 Encounters:  05/14/22 (!) 142/78  05/10/22 130/70  01/10/22 124/70    Wt Readings from Last 3 Encounters:  05/14/22 128 lb 6.4 oz (58.2 kg)  05/10/22 128 lb (58.1 kg)  01/10/22 128 lb (58.1 kg)    Physical Exam Constitutional:      General: She is not in acute distress.    Appearance: Normal appearance. She is well-developed.  HENT:     Head: Normocephalic.     Right Ear: External ear normal.     Left Ear: External ear normal.     Nose: Nose normal.  Eyes:     General:        Right eye: No discharge.        Left eye: No discharge.     Conjunctiva/sclera: Conjunctivae normal.     Pupils: Pupils are equal, round, and reactive to light.  Neck:     Thyroid: No thyromegaly.     Vascular: No JVD.  Trachea: No tracheal deviation.  Cardiovascular:     Rate and Rhythm: Normal rate and regular rhythm.     Heart sounds: Normal heart sounds.  Pulmonary:     Effort: No respiratory distress.     Breath sounds: No stridor. No wheezing.  Abdominal:     General: Bowel sounds are normal. There is no distension.     Palpations: Abdomen is soft. There is no mass.     Tenderness: There is no abdominal tenderness. There is no guarding or rebound.  Musculoskeletal:        General: No tenderness.     Cervical back: Normal range of motion and neck supple. No rigidity.  Lymphadenopathy:     Cervical: No cervical adenopathy.  Skin:    Findings: No erythema or rash.  Neurological:     Cranial Nerves: No cranial nerve deficit.     Motor: No abnormal muscle tone.      Coordination: Coordination normal.     Deep Tendon Reflexes: Reflexes normal.  Psychiatric:        Behavior: Behavior normal.        Thought Content: Thought content normal.        Judgment: Judgment normal.     Lab Results  Component Value Date   WBC 5.1 11/11/2018   HGB 12.0 11/11/2018   HCT 35.6 (L) 11/11/2018   PLT 231.0 11/11/2018   GLUCOSE 90 10/05/2021   CHOL 219 (H) 11/11/2018   TRIG 128.0 11/11/2018   HDL 50.30 11/11/2018   LDLDIRECT 196.7 12/22/2010   LDLCALC 143 (H) 11/11/2018   ALT 12 10/05/2021   AST 16 10/05/2021   NA 138 10/05/2021   K 4.3 10/05/2021   CL 101 10/05/2021   CREATININE 1.09 10/05/2021   BUN 17 10/05/2021   CO2 30 10/05/2021   TSH 1.99 07/05/2021   INR 0.96 03/24/2010   HGBA1C 5.7 04/26/2010    MM 3D SCREEN BREAST BILATERAL  Result Date: 02/07/2022 CLINICAL DATA:  Screening. EXAM: DIGITAL SCREENING BILATERAL MAMMOGRAM WITH TOMOSYNTHESIS AND CAD TECHNIQUE: Bilateral screening digital craniocaudal and mediolateral oblique mammograms were obtained. Bilateral screening digital breast tomosynthesis was performed. The images were evaluated with computer-aided detection. COMPARISON:  Previous exam(s). ACR Breast Density Category c: The breast tissue is heterogeneously dense, which may obscure small masses. FINDINGS: There are no findings suspicious for malignancy. IMPRESSION: No mammographic evidence of malignancy. A result letter of this screening mammogram will be mailed directly to the patient. RECOMMENDATION: Screening mammogram in one year. (Code:SM-B-01Y) BI-RADS CATEGORY  1: Negative. Electronically Signed   By: Fidela Salisbury M.D.   On: 02/07/2022 16:33    Assessment & Plan:   Problem List Items Addressed This Visit     B12 deficiency    On B12      Relevant Orders   CBC with Differential/Platelet   Vitamin B12   CKD (chronic kidney disease) stage 3, GFR 30-59 ml/min ([facility])    Hydrate well daily. Monitor GFR      Diarrhea     Chronic problem Imodium prn Lactose intolerance - avoid milk D/c Doxy - abd pain      Relevant Orders   CBC with Differential/Platelet   Comprehensive metabolic panel   TSH   Memory loss    Doing well. Stable mild cognitive deficit.      Relevant Orders   TSH   Vitamin B12   Osteoporosis    On Prolia She will tell her dentist about Prolia inj  Meds ordered this encounter  Medications   diclofenac Sodium (VOLTAREN) 1 % GEL    Sig: Apply 2 g topically 4 (four) times daily. To the L hip are    Dispense:  300 g    Refill:  1      Follow-up: Return in about 4 months (around 09/13/2022) for a follow-up visit.  Walker Kehr, MD

## 2022-05-14 NOTE — Assessment & Plan Note (Addendum)
Chronic problem Imodium prn Lactose intolerance - avoid milk D/c Doxy - abd pain

## 2022-06-29 DIAGNOSIS — Z23 Encounter for immunization: Secondary | ICD-10-CM | POA: Diagnosis not present

## 2022-08-08 ENCOUNTER — Ambulatory Visit: Payer: Medicare Other | Admitting: Podiatry

## 2022-08-08 DIAGNOSIS — H04123 Dry eye syndrome of bilateral lacrimal glands: Secondary | ICD-10-CM | POA: Diagnosis not present

## 2022-08-08 DIAGNOSIS — H35363 Drusen (degenerative) of macula, bilateral: Secondary | ICD-10-CM | POA: Diagnosis not present

## 2022-08-08 DIAGNOSIS — H35373 Puckering of macula, bilateral: Secondary | ICD-10-CM | POA: Diagnosis not present

## 2022-08-08 DIAGNOSIS — H40023 Open angle with borderline findings, high risk, bilateral: Secondary | ICD-10-CM | POA: Diagnosis not present

## 2022-08-08 LAB — HM DIABETES EYE EXAM

## 2022-08-10 ENCOUNTER — Ambulatory Visit (INDEPENDENT_AMBULATORY_CARE_PROVIDER_SITE_OTHER): Payer: Medicare Other | Admitting: Podiatry

## 2022-08-10 ENCOUNTER — Encounter: Payer: Self-pay | Admitting: Podiatry

## 2022-08-10 DIAGNOSIS — B351 Tinea unguium: Secondary | ICD-10-CM

## 2022-08-10 DIAGNOSIS — M79675 Pain in left toe(s): Secondary | ICD-10-CM

## 2022-08-10 DIAGNOSIS — L84 Corns and callosities: Secondary | ICD-10-CM

## 2022-08-10 DIAGNOSIS — M79674 Pain in right toe(s): Secondary | ICD-10-CM | POA: Diagnosis not present

## 2022-08-10 NOTE — Patient Instructions (Addendum)
Dear Dr. Karsten Ro,  Mom has a callus on the bottom of her left foot. Due to her not being diabetic or having neuropathy, Medicare will not cover. She refused treatment for it today, so I am providing instructions to help her manage at home. If at any time, she'd like for me to trim it, she will have to complete a new Medicare ABN. The cost for one lesion is $190.00.   EPSOM SALT FOOT SOAK INSTRUCTIONS  Shopping List:  A. Plain epsom salt (not scented)    Place 1/4 cup of epsom salts in 2 quarts of warm tap water. IF YOU ARE DIABETIC, OR HAVE NEUROPATHY, CHECK THE TEMPERATURE OF THE WATER WITH YOUR ELBOW.   Submerge your foot/feet in the solution and soak for 10-15 minutes.      3.  Next, remove your foot/feet from solution, blot dry the affected area.    4.  Use a pumice stone to gently file your callus.  5.  Apply moisturizing lotion to feet avoiding application between toes.  5.  This soak should be done every two weeks.

## 2022-08-10 NOTE — Progress Notes (Unsigned)
  Subjective:  Patient ID: Joyce Riley, female    DOB: January 08, 1934,  MRN: [id number]  Joyce Riley presents to clinic today for {jgcomplaint:23593}  Chief Complaint  Patient presents with   Nail Problem    Routine foot care PCP-Plotnikov PCP VST-  3 months ago   New problem(s): None. {jgcomplaint:23593}  PCP is Plotnikov, Evie Lacks, MD , and last visit was {Time; dates multiple:15870}.  Allergies  Allergen Reactions   Aspirin     bruising   Atorvastatin     REACTION: myalgia   Codeine Other (See Comments)   Meclizine Hcl     REACTION: rash   Milk-Related Compounds    Simvastatin     Dizzy, vomiting    Review of Systems: Negative except as noted in the HPI.  Objective: No changes noted in today's physical examination.  Joyce Riley is a pleasant 88 y.o. female {jgbodyhabitus:24098} AAO x 3.  Vascular Examination: Capillary refill time immediate b/l.Vascular status intact b/l with palpable pedal pulses. Pedal hair present b/l.  No pain with calf compression b/l. Skin temperature gradient WNL b/l. +1 pitting edema noted BLE.  Neurological Examination: Sensation grossly intact b/l with 10 gram monofilament. Vibratory sensation intact b/l. Proprioception intact bilaterally.  Dermatological Examination: Pedal skin with normal turgor, texture and tone b/l. Toenails 1-5 b/l thick, discolored, elongated with subungual debris and pain on dorsal palpation. Hyperkeratotic lesion(s) {jgPodToeLocator:23637}.  No erythema, no edema, no drainage, no fluctuance.  Musculoskeletal Examination: Muscle strength 5/5 to all lower extremity muscle groups bilaterally. HAV with bunion deformity noted b/l LE.  Radiographs: None  Assessment/Plan: 1. Pain due to onychomycosis of toenails of both feet   2. Callus     No orders of the defined types were placed in this encounter.   {Jgplan:23602::"-Patient/POA to call should there be question/concern in the interim."}   Return in  about 3 months (around 11/10/2022).  Marzetta Board, DPM

## 2022-08-13 ENCOUNTER — Encounter: Payer: Self-pay | Admitting: Internal Medicine

## 2022-08-13 ENCOUNTER — Ambulatory Visit (INDEPENDENT_AMBULATORY_CARE_PROVIDER_SITE_OTHER): Payer: Medicare Other | Admitting: Internal Medicine

## 2022-08-13 VITALS — BP 128/78 | HR 73 | Temp 97.9°F | Ht <= 58 in | Wt 127.0 lb

## 2022-08-13 DIAGNOSIS — R413 Other amnesia: Secondary | ICD-10-CM

## 2022-08-13 DIAGNOSIS — M810 Age-related osteoporosis without current pathological fracture: Secondary | ICD-10-CM | POA: Diagnosis not present

## 2022-08-13 DIAGNOSIS — R252 Cramp and spasm: Secondary | ICD-10-CM

## 2022-08-13 DIAGNOSIS — E559 Vitamin D deficiency, unspecified: Secondary | ICD-10-CM

## 2022-08-13 DIAGNOSIS — R634 Abnormal weight loss: Secondary | ICD-10-CM | POA: Diagnosis not present

## 2022-08-13 DIAGNOSIS — R42 Dizziness and giddiness: Secondary | ICD-10-CM | POA: Diagnosis not present

## 2022-08-13 DIAGNOSIS — E538 Deficiency of other specified B group vitamins: Secondary | ICD-10-CM | POA: Diagnosis not present

## 2022-08-13 NOTE — Assessment & Plan Note (Signed)
Use Magnesium oil spray for occasional cramps

## 2022-08-13 NOTE — Assessment & Plan Note (Signed)
On Vit D 

## 2022-08-13 NOTE — Patient Instructions (Signed)
Use Magnesium oil spray for occasional cramps

## 2022-08-13 NOTE — Assessment & Plan Note (Signed)
On Vit B12 

## 2022-08-13 NOTE — Assessment & Plan Note (Addendum)
S/p oral surgery - oral surgery appt pending for a f/u. Prolia on hold

## 2022-08-13 NOTE — Progress Notes (Signed)
Subjective:  Patient ID: Joyce Riley, female    DOB: [birth year]  Age: 88 y.o. MRN: [id number]  CC: Follow-up   HPI Joyce Riley presents for occasional cramps  Outpatient Medications Prior to Visit  Medication Sig Dispense Refill   B-Complex CAPS Take by mouth.     calcium-vitamin D (OSCAL WITH D) 500-200 MG-UNIT tablet Take 1 tablet by mouth 2 (two) times daily. 100 tablet 3   chlorhexidine (PERIDEX) 0.12 % solution SMARTSIG:By Mouth     Cholecalciferol 1000 UNITS tablet Take 1 tablet (1,000 Units total) by mouth daily. 100 tablet 3   denosumab (PROLIA) 60 MG/ML SOSY injection Inject 60 mg into the skin every 6 (six) months.     Dexlansoprazole (DEXILANT) 30 MG capsule Take 1 capsule (30 mg total) by mouth daily. 90 capsule 3   diclofenac Sodium (VOLTAREN) 1 % GEL Apply 2 g topically 4 (four) times daily. To the L hip are 300 g 1   doxycycline (VIBRAMYCIN) 100 MG capsule Take by mouth 2 (two) times daily.     loperamide (IMODIUM A-D) 2 MG tablet Take 1-2 tablets (2-4 mg total) by mouth 4 (four) times daily as needed for diarrhea or loose stools. 60 tablet 1   Magnesium 250 MG TABS Take 1 tablet (250 mg total) by mouth daily. 100 tablet 3   triamcinolone ointment (KENALOG) 0.5 % Apply 1 application topically 2 (two) times daily. 90 g 3   No facility-administered medications prior to visit.    ROS: Review of Systems  Constitutional:  Negative for activity change, appetite change, chills, fatigue and unexpected weight change.  HENT:  Negative for congestion, mouth sores and sinus pressure.   Eyes:  Negative for visual disturbance.  Respiratory:  Negative for cough and chest tightness.   Gastrointestinal:  Negative for abdominal pain and nausea.  Genitourinary:  Negative for difficulty urinating, frequency and vaginal pain.  Musculoskeletal:  Negative for back pain and gait problem.  Skin:  Negative for pallor and rash.  Neurological:  Positive for dizziness and headaches.  Negative for tremors, weakness and numbness.  Hematological:  Does not bruise/bleed easily.  Psychiatric/Behavioral:  Negative for confusion and sleep disturbance. The patient is not nervous/anxious.     Objective:  BP 128/78 (BP Location: Right Arm, Patient Position: Sitting, Cuff Size: Normal)   Pulse 73   Temp 97.9 F (36.6 C) (Oral)   Ht 4' 6.5" (1.384 m)   Wt 127 lb (57.6 kg)   SpO2 98%   BMI 30.06 kg/m   BP Readings from Last 3 Encounters:  08/13/22 128/78  05/14/22 (!) 142/78  05/10/22 130/70    Wt Readings from Last 3 Encounters:  08/13/22 127 lb (57.6 kg)  05/14/22 128 lb 6.4 oz (58.2 kg)  05/10/22 128 lb (58.1 kg)    Physical Exam Constitutional:      General: She is not in acute distress.    Appearance: She is well-developed. She is obese.  HENT:     Head: Normocephalic.     Right Ear: External ear normal.     Left Ear: External ear normal.     Nose: Nose normal.  Eyes:     General:        Right eye: No discharge.        Left eye: No discharge.     Conjunctiva/sclera: Conjunctivae normal.     Pupils: Pupils are equal, round, and reactive to light.  Neck:     Thyroid: No thyromegaly.  Vascular: No JVD.     Trachea: No tracheal deviation.  Cardiovascular:     Rate and Rhythm: Normal rate and regular rhythm.     Heart sounds: Normal heart sounds.  Pulmonary:     Effort: No respiratory distress.     Breath sounds: No stridor. No wheezing.  Abdominal:     General: Bowel sounds are normal. There is no distension.     Palpations: Abdomen is soft. There is no mass.     Tenderness: There is no abdominal tenderness. There is no guarding or rebound.  Musculoskeletal:        General: No tenderness.     Cervical back: Normal range of motion and neck supple. No rigidity.  Lymphadenopathy:     Cervical: No cervical adenopathy.  Skin:    Findings: No erythema or rash.  Neurological:     Cranial Nerves: No cranial nerve deficit.     Motor: No abnormal  muscle tone.     Coordination: Coordination normal.     Gait: Gait normal.     Deep Tendon Reflexes: Reflexes normal.  Psychiatric:        Behavior: Behavior normal.        Thought Content: Thought content normal.        Judgment: Judgment normal.     Lab Results  Component Value Date   WBC 5.9 05/14/2022   HGB 11.0 (L) 05/14/2022   HCT 32.5 (L) 05/14/2022   PLT 236.0 05/14/2022   GLUCOSE 83 05/14/2022   CHOL 219 (H) 11/11/2018   TRIG 128.0 11/11/2018   HDL 50.30 11/11/2018   LDLDIRECT 196.7 12/22/2010   LDLCALC 143 (H) 11/11/2018   ALT 8 05/14/2022   AST 12 05/14/2022   NA 139 05/14/2022   K 3.9 05/14/2022   CL 101 05/14/2022   CREATININE 1.07 05/14/2022   BUN 19 05/14/2022   CO2 26 05/14/2022   TSH 1.94 05/14/2022   INR 0.96 03/24/2010   HGBA1C 5.7 04/26/2010    MM 3D SCREEN BREAST BILATERAL  Result Date: 02/07/2022 CLINICAL DATA:  Screening. EXAM: DIGITAL SCREENING BILATERAL MAMMOGRAM WITH TOMOSYNTHESIS AND CAD TECHNIQUE: Bilateral screening digital craniocaudal and mediolateral oblique mammograms were obtained. Bilateral screening digital breast tomosynthesis was performed. The images were evaluated with computer-aided detection. COMPARISON:  Previous exam(s). ACR Breast Density Category c: The breast tissue is heterogeneously dense, which may obscure small masses. FINDINGS: There are no findings suspicious for malignancy. IMPRESSION: No mammographic evidence of malignancy. A result letter of this screening mammogram will be mailed directly to the patient. RECOMMENDATION: Screening mammogram in one year. (Code:SM-B-01Y) BI-RADS CATEGORY  1: Negative. Electronically Signed   By: Fidela Salisbury M.D.   On: 02/07/2022 16:33    Assessment & Plan:   Problem List Items Addressed This Visit     B12 deficiency    On Vit B12      Vitamin D deficiency    On Vit D      VERTIGO    Worse Severe episodes, occ with n/v - needs assistance      Osteoporosis    S/p  oral surgery - oral surgery appt pending for a f/u. Prolia on hold      Weight loss    Wt Readings from Last 3 Encounters:  08/13/22 127 lb (57.6 kg)  05/14/22 128 lb 6.4 oz (58.2 kg)  05/10/22 128 lb (58.1 kg)  Stable wt - oral surgery appt pending for a f/u  Memory loss    Needs assistance      Muscle cramps - Primary    Use Magnesium oil spray for occasional cramps         No orders of the defined types were placed in this encounter.     Follow-up: Return in about 3 months (around 11/13/2022) for a follow-up visit.  Walker Kehr, MD

## 2022-08-13 NOTE — Assessment & Plan Note (Addendum)
Wt Readings from Last 3 Encounters:  08/13/22 127 lb (57.6 kg)  05/14/22 128 lb 6.4 oz (58.2 kg)  05/10/22 128 lb (58.1 kg)  Stable wt - oral surgery appt pending for a f/u

## 2022-08-13 NOTE — Assessment & Plan Note (Signed)
Needs assistance.

## 2022-08-13 NOTE — Assessment & Plan Note (Addendum)
Worse Severe episodes, occ with n/v - needs assistance

## 2022-08-14 ENCOUNTER — Encounter: Payer: Self-pay | Admitting: Internal Medicine

## 2022-09-05 DIAGNOSIS — H40021 Open angle with borderline findings, high risk, right eye: Secondary | ICD-10-CM | POA: Diagnosis not present

## 2022-09-19 DIAGNOSIS — H40022 Open angle with borderline findings, high risk, left eye: Secondary | ICD-10-CM | POA: Diagnosis not present

## 2022-11-12 ENCOUNTER — Telehealth: Payer: Self-pay

## 2022-11-12 NOTE — Telephone Encounter (Deleted)
le

## 2022-11-12 NOTE — Telephone Encounter (Signed)
Called patient to schedule Medicare Annual Wellness Visit (AWV). Left message for patient to call back and schedule Medicare Annual Wellness Visit (AWV).  Last date of AWV: 11/02/21  AWV appointment scheduled for 11/12/22 was cancelled due to [facility] being sick, please reschedule appointment at any time with [facility].  If any questions, please contact me at [phone].  Thank you ,  P.J. Quentin Cornwall, CMA (AAMA)

## 2022-11-13 ENCOUNTER — Ambulatory Visit (INDEPENDENT_AMBULATORY_CARE_PROVIDER_SITE_OTHER): Payer: Medicare Other | Admitting: Internal Medicine

## 2022-11-13 ENCOUNTER — Encounter: Payer: Self-pay | Admitting: Internal Medicine

## 2022-11-13 VITALS — BP 120/78 | HR 67 | Temp 97.9°F | Ht <= 58 in | Wt 128.0 lb

## 2022-11-13 DIAGNOSIS — M810 Age-related osteoporosis without current pathological fracture: Secondary | ICD-10-CM | POA: Diagnosis not present

## 2022-11-13 DIAGNOSIS — E785 Hyperlipidemia, unspecified: Secondary | ICD-10-CM | POA: Diagnosis not present

## 2022-11-13 DIAGNOSIS — K219 Gastro-esophageal reflux disease without esophagitis: Secondary | ICD-10-CM | POA: Diagnosis not present

## 2022-11-13 DIAGNOSIS — N1831 Chronic kidney disease, stage 3a: Secondary | ICD-10-CM | POA: Diagnosis not present

## 2022-11-13 DIAGNOSIS — E538 Deficiency of other specified B group vitamins: Secondary | ICD-10-CM

## 2022-11-13 DIAGNOSIS — R32 Unspecified urinary incontinence: Secondary | ICD-10-CM | POA: Diagnosis not present

## 2022-11-13 LAB — URINALYSIS
Bilirubin Urine: NEGATIVE
Hgb urine dipstick: NEGATIVE
Ketones, ur: NEGATIVE
Leukocytes,Ua: NEGATIVE
Nitrite: NEGATIVE
Specific Gravity, Urine: 1.025 (ref 1.000–1.030)
Total Protein, Urine: NEGATIVE
Urine Glucose: NEGATIVE
Urobilinogen, UA: 0.2 (ref 0.0–1.0)
pH: 6 (ref 5.0–8.0)

## 2022-11-13 LAB — COMPREHENSIVE METABOLIC PANEL
ALT: 13 U/L (ref 0–35)
AST: 19 U/L (ref 0–37)
Albumin: 3.9 g/dL (ref 3.5–5.2)
Alkaline Phosphatase: 46 U/L (ref 39–117)
BUN: 18 mg/dL (ref 6–23)
CO2: 28 mEq/L (ref 19–32)
Calcium: 9.2 mg/dL (ref 8.4–10.5)
Chloride: 103 mEq/L (ref 96–112)
Creatinine, Ser: 1.02 mg/dL (ref 0.40–1.20)
GFR: 49.8 mL/min — ABNORMAL LOW (ref >60.00)
Glucose, Bld: 86 mg/dL (ref 70–99)
Potassium: 3.6 mEq/L (ref 3.5–5.1)
Sodium: 139 mEq/L (ref 135–145)
Total Bilirubin: 0.5 mg/dL (ref 0.2–1.2)
Total Protein: 7.1 g/dL (ref 6.0–8.3)

## 2022-11-13 LAB — CBC WITH DIFFERENTIAL/PLATELET
Basophils Absolute: 0 10*3/uL (ref 0.0–0.1)
Basophils Relative: 0.7 % (ref 0.0–3.0)
Eosinophils Absolute: 0 10*3/uL (ref 0.0–0.7)
Eosinophils Relative: 0.9 % (ref 0.0–5.0)
HCT: 33.2 % — ABNORMAL LOW (ref 36.0–46.0)
Hemoglobin: 11.2 g/dL — ABNORMAL LOW (ref 12.0–15.0)
Lymphocytes Relative: 34 % (ref 12.0–46.0)
Lymphs Abs: 1.6 10*3/uL (ref 0.7–4.0)
MCHC: 33.9 g/dL (ref 30.0–36.0)
MCV: 89.8 fl (ref 78.0–100.0)
Monocytes Absolute: 0.4 10*3/uL (ref 0.1–1.0)
Monocytes Relative: 8.4 % (ref 3.0–12.0)
Neutro Abs: 2.7 10*3/uL (ref 1.4–7.7)
Neutrophils Relative %: 56 % (ref 43.0–77.0)
Platelets: 220 10*3/uL (ref 150.0–400.0)
RBC: 3.69 Mil/uL — ABNORMAL LOW (ref 3.87–5.11)
RDW: 12.8 % (ref 11.5–15.5)
WBC: 4.8 10*3/uL (ref 4.0–10.5)

## 2022-11-13 LAB — TSH: TSH: 1.93 u[IU]/mL (ref 0.35–5.50)

## 2022-11-13 MED ORDER — DEXLANSOPRAZOLE 30 MG PO CPDR: 1.0000 | DELAYED_RELEASE_CAPSULE | Freq: Every day | ORAL | 3 refills | Status: DC

## 2022-11-13 MED ORDER — UNDERPADS REGULAR MISC: 5 refills | Status: DC

## 2022-11-13 MED ORDER — DEPEND FITTED BRIEFS SM/MED MISC: 11 refills | Status: DC

## 2022-11-13 NOTE — Assessment & Plan Note (Signed)
On B12 

## 2022-11-13 NOTE — Assessment & Plan Note (Signed)
On Dexiland

## 2022-11-13 NOTE — Assessment & Plan Note (Signed)
Hydrate well daily. Monitor GFR

## 2022-11-13 NOTE — Progress Notes (Signed)
Subjective:  Patient ID: Joyce Riley, female    DOB: [birth year]  Age: 89 y.o. MRN: [id number]  CC: Follow-up   HPI Joyce Riley presents for B12 def, GERD C/o urinary incontinence   Outpatient Medications Prior to Visit  Medication Sig Dispense Refill   B-Complex CAPS Take by mouth.     calcium-vitamin D (OSCAL WITH D) 500-200 MG-UNIT tablet Take 1 tablet by mouth 2 (two) times daily. 100 tablet 3   chlorhexidine (PERIDEX) 0.12 % solution SMARTSIG:By Mouth     Cholecalciferol 1000 UNITS tablet Take 1 tablet (1,000 Units total) by mouth daily. 100 tablet 3   denosumab (PROLIA) 60 MG/ML SOSY injection Inject 60 mg into the skin every 6 (six) months.     diclofenac Sodium (VOLTAREN) 1 % GEL Apply 2 g topically 4 (four) times daily. To the L hip are 300 g 1   doxycycline (VIBRAMYCIN) 100 MG capsule Take by mouth 2 (two) times daily.     loperamide (IMODIUM A-D) 2 MG tablet Take 1-2 tablets (2-4 mg total) by mouth 4 (four) times daily as needed for diarrhea or loose stools. 60 tablet 1   Magnesium 250 MG TABS Take 1 tablet (250 mg total) by mouth daily. 100 tablet 3   triamcinolone ointment (KENALOG) 0.5 % Apply 1 application topically 2 (two) times daily. 90 g 3   Dexlansoprazole (DEXILANT) 30 MG capsule Take 1 capsule (30 mg total) by mouth daily. 90 capsule 3   No facility-administered medications prior to visit.    ROS: Review of Systems  Constitutional:  Negative for activity change, appetite change, chills, fatigue and unexpected weight change.  HENT:  Negative for congestion, mouth sores and sinus pressure.   Eyes:  Negative for visual disturbance.  Respiratory:  Negative for cough and chest tightness.   Gastrointestinal:  Negative for abdominal pain and nausea.  Genitourinary:  Positive for enuresis, frequency and urgency. Negative for difficulty urinating and vaginal pain.  Musculoskeletal:  Positive for arthralgias. Negative for back pain and gait problem.  Skin:   Negative for pallor and rash.  Neurological:  Negative for dizziness, tremors, weakness, numbness and headaches.  Psychiatric/Behavioral:  Negative for confusion and sleep disturbance.     Objective:  BP 120/78 (BP Location: Right Arm, Patient Position: Sitting, Cuff Size: Normal)   Pulse 67   Temp 97.9 F (36.6 C) (Oral)   Ht 4' 6.5" (1.384 m)   Wt 128 lb (58.1 kg)   SpO2 97%   BMI 30.30 kg/m   BP Readings from Last 3 Encounters:  11/13/22 120/78  08/13/22 128/78  05/14/22 (!) 142/78    Wt Readings from Last 3 Encounters:  11/13/22 128 lb (58.1 kg)  08/13/22 127 lb (57.6 kg)  05/14/22 128 lb 6.4 oz (58.2 kg)    Physical Exam Constitutional:      General: She is not in acute distress.    Appearance: She is well-developed.  HENT:     Head: Normocephalic.     Right Ear: External ear normal.     Left Ear: External ear normal.     Nose: Nose normal.  Eyes:     General:        Right eye: No discharge.        Left eye: No discharge.     Conjunctiva/sclera: Conjunctivae normal.     Pupils: Pupils are equal, round, and reactive to light.  Neck:     Thyroid: No thyromegaly.     Vascular: No  JVD.     Trachea: No tracheal deviation.  Cardiovascular:     Rate and Rhythm: Normal rate and regular rhythm.     Heart sounds: Normal heart sounds.  Pulmonary:     Effort: No respiratory distress.     Breath sounds: No stridor. No wheezing.  Abdominal:     General: Bowel sounds are normal. There is no distension.     Palpations: Abdomen is soft. There is no mass.     Tenderness: There is no abdominal tenderness. There is no guarding or rebound.  Musculoskeletal:        General: No tenderness.     Cervical back: Normal range of motion and neck supple. No rigidity.  Lymphadenopathy:     Cervical: No cervical adenopathy.  Skin:    Findings: No erythema or rash.  Neurological:     Cranial Nerves: No cranial nerve deficit.     Motor: No abnormal muscle tone.     Coordination:  Coordination normal.     Deep Tendon Reflexes: Reflexes normal.  Psychiatric:        Behavior: Behavior normal.        Thought Content: Thought content normal.        Judgment: Judgment normal.     Lab Results  Component Value Date   WBC 5.9 05/14/2022   HGB 11.0 (L) 05/14/2022   HCT 32.5 (L) 05/14/2022   PLT 236.0 05/14/2022   GLUCOSE 83 05/14/2022   CHOL 219 (H) 11/11/2018   TRIG 128.0 11/11/2018   HDL 50.30 11/11/2018   LDLDIRECT 196.7 12/22/2010   LDLCALC 143 (H) 11/11/2018   ALT 8 05/14/2022   AST 12 05/14/2022   NA 139 05/14/2022   K 3.9 05/14/2022   CL 101 05/14/2022   CREATININE 1.07 05/14/2022   BUN 19 05/14/2022   CO2 26 05/14/2022   TSH 1.94 05/14/2022   INR 0.96 03/24/2010   HGBA1C 5.7 04/26/2010    MM 3D SCREEN BREAST BILATERAL  Result Date: 02/07/2022 CLINICAL DATA:  Screening. EXAM: DIGITAL SCREENING BILATERAL MAMMOGRAM WITH TOMOSYNTHESIS AND CAD TECHNIQUE: Bilateral screening digital craniocaudal and mediolateral oblique mammograms were obtained. Bilateral screening digital breast tomosynthesis was performed. The images were evaluated with computer-aided detection. COMPARISON:  Previous exam(s). ACR Breast Density Category c: The breast tissue is heterogeneously dense, which may obscure small masses. FINDINGS: There are no findings suspicious for malignancy. IMPRESSION: No mammographic evidence of malignancy. A result letter of this screening mammogram will be mailed directly to the patient. RECOMMENDATION: Screening mammogram in one year. (Code:SM-B-01Y) BI-RADS CATEGORY  1: Negative. Electronically Signed   By: Fidela Salisbury M.D.   On: 02/07/2022 16:33    Assessment & Plan:   Problem List Items Addressed This Visit       Digestive   GERD - Primary    On Dexiland      Relevant Medications   Dexlansoprazole (DEXILANT) 30 MG capsule DR   Other Relevant Orders   TSH   CBC with Differential/Platelet   Comprehensive metabolic panel   Urinalysis      Musculoskeletal and Integument   Osteoporosis    On Prolia        Genitourinary   CKD (chronic kidney disease) stage 3, GFR 30-59 ml/min (HCC)    Hydrate well daily. Monitor GFR      Relevant Orders   TSH   CBC with Differential/Platelet   Comprehensive metabolic panel   Urinalysis     Other   Incontinence in  female    Given Rx for supplies      Relevant Orders   Urinalysis   Dyslipidemia   Relevant Orders   TSH   B12 deficiency    On B12      Relevant Orders   CBC with Differential/Platelet      Meds ordered this encounter  Medications   Incontinence Supply Disposable (DEPEND FITTED BRIEFS SM/MED) MISC    Sig: As directed    Dispense:  100 each    Refill:  11   Incontinence Supply Disposable (UNDERPADS REGULAR) MISC    Sig: As directed    Dispense:  40 each    Refill:  5   Dexlansoprazole (DEXILANT) 30 MG capsule DR    Sig: Take 1 capsule (30 mg total) by mouth daily.    Dispense:  90 capsule    Refill:  3      Follow-up: Return in about 3 months (around 02/11/2023) for a follow-up visit.  Walker Kehr, MD

## 2022-11-13 NOTE — Assessment & Plan Note (Signed)
Given Rx for supplies

## 2022-11-13 NOTE — Assessment & Plan Note (Signed)
On Prolia 

## 2022-11-20 ENCOUNTER — Encounter: Payer: Self-pay | Admitting: Internal Medicine

## 2022-11-21 ENCOUNTER — Encounter: Payer: Self-pay | Admitting: Internal Medicine

## 2022-11-21 DIAGNOSIS — H40023 Open angle with borderline findings, high risk, bilateral: Secondary | ICD-10-CM | POA: Diagnosis not present

## 2022-11-21 LAB — HM DIABETES EYE EXAM

## 2022-11-27 NOTE — Telephone Encounter (Signed)
[facility] called and said that they did not receive the 2nd page back - the cmn page  - please fill out and send back;.

## 2022-12-10 ENCOUNTER — Ambulatory Visit (INDEPENDENT_AMBULATORY_CARE_PROVIDER_SITE_OTHER): Payer: Medicare Other | Admitting: Podiatry

## 2022-12-10 ENCOUNTER — Encounter: Payer: Self-pay | Admitting: Podiatry

## 2022-12-10 DIAGNOSIS — B351 Tinea unguium: Secondary | ICD-10-CM | POA: Diagnosis not present

## 2022-12-10 DIAGNOSIS — M79675 Pain in left toe(s): Secondary | ICD-10-CM | POA: Diagnosis not present

## 2022-12-10 DIAGNOSIS — M79674 Pain in right toe(s): Secondary | ICD-10-CM | POA: Diagnosis not present

## 2022-12-10 DIAGNOSIS — S90414A Abrasion, right lesser toe(s), initial encounter: Secondary | ICD-10-CM | POA: Diagnosis not present

## 2022-12-10 NOTE — Patient Instructions (Signed)
Apply Neosporin to right 2nd toe once daily for one week.

## 2022-12-10 NOTE — Progress Notes (Unsigned)
  Subjective:  Patient ID: Joyce Riley, female    DOB: Apr 11, 1934,  MRN: [id number]  Joyce Riley presents to clinic today for {jgcomplaint:23593}  Chief Complaint  Patient presents with   Nail Problem    [facility] PCP-Plontikov PCP VST-do not remember    New problem(s): None. {jgcomplaint:23593}  PCP is Plotnikov, Evie Lacks, MD.  Allergies  Allergen Reactions   Aspirin     bruising   Atorvastatin     REACTION: myalgia   Codeine Other (See Comments)   Meclizine Hcl     REACTION: rash   Milk-Related Compounds    Simvastatin     Dizzy, vomiting    Review of Systems: Negative except as noted in the HPI.  Objective: No changes noted in today's physical examination. There were no vitals filed for this visit. Joyce Riley is a pleasant 89 y.o. female {jgbodyhabitus:24098} AAO x 3. Vascular Examination: Capillary refill time immediate b/l.Vascular status intact b/l with palpable pedal pulses. Pedal hair present b/l.  No pain with calf compression b/l. Skin temperature gradient WNL b/l. +1 pitting edema noted BLE.  Neurological Examination: Sensation grossly intact b/l with 10 gram monofilament. Vibratory sensation intact b/l. Proprioception intact bilaterally.  Dermatological Examination: Pedal skin with normal turgor, texture and tone b/l.   Toenails 1-5 b/l thick, discolored, elongated with subungual debris and pain on dorsal palpation. Hyperkeratotic lesion(s) submet head 3 left foot.  No erythema, no edema, no drainage, no fluctuance.  Musculoskeletal Examination: Muscle strength 5/5 to all lower extremity muscle groups bilaterally. HAV with bunion deformity noted b/l LE.  Radiographs: None  Assessment/Plan: No diagnosis found.  No orders of the defined types were placed in this encounter.   None {Jgplan:23602::"-Patient/POA to call should there be question/concern in the interim."}   No follow-ups on file.  Marzetta Board, DPM

## 2022-12-11 ENCOUNTER — Encounter: Payer: Self-pay | Admitting: Internal Medicine

## 2022-12-11 ENCOUNTER — Other Ambulatory Visit: Payer: Self-pay | Admitting: Internal Medicine

## 2023-01-09 DIAGNOSIS — H04123 Dry eye syndrome of bilateral lacrimal glands: Secondary | ICD-10-CM | POA: Diagnosis not present

## 2023-01-09 DIAGNOSIS — H40023 Open angle with borderline findings, high risk, bilateral: Secondary | ICD-10-CM | POA: Diagnosis not present

## 2023-01-17 ENCOUNTER — Other Ambulatory Visit: Payer: Self-pay | Admitting: Internal Medicine

## 2023-01-17 DIAGNOSIS — Z1231 Encounter for screening mammogram for malignant neoplasm of breast: Secondary | ICD-10-CM

## 2023-01-24 ENCOUNTER — Telehealth: Payer: Self-pay | Admitting: Radiology

## 2023-01-24 NOTE — Telephone Encounter (Signed)
Contacted [facility] to schedule their annual wellness visit. LVM for daughter Philomena Doheny to call back to get Ms.Lori AWV scehduled.   Chaneka Trefz K. CMA

## 2023-02-06 ENCOUNTER — Telehealth: Payer: Self-pay | Admitting: Internal Medicine

## 2023-02-06 NOTE — Telephone Encounter (Signed)
Contacted [facility] to schedule their annual wellness visit. Appointment made for 02/19/2023.  [facility] Care Guide [facility] AWV TEAM Direct Dial: [phone]

## 2023-02-11 ENCOUNTER — Encounter: Payer: Self-pay | Admitting: Internal Medicine

## 2023-02-11 ENCOUNTER — Ambulatory Visit (INDEPENDENT_AMBULATORY_CARE_PROVIDER_SITE_OTHER): Payer: Medicare Other

## 2023-02-11 ENCOUNTER — Ambulatory Visit (INDEPENDENT_AMBULATORY_CARE_PROVIDER_SITE_OTHER): Payer: Medicare Other | Admitting: Internal Medicine

## 2023-02-11 VITALS — BP 120/78 | HR 72 | Temp 98.6°F | Ht <= 58 in | Wt 128.0 lb

## 2023-02-11 DIAGNOSIS — M810 Age-related osteoporosis without current pathological fracture: Secondary | ICD-10-CM | POA: Diagnosis not present

## 2023-02-11 DIAGNOSIS — R61 Generalized hyperhidrosis: Secondary | ICD-10-CM

## 2023-02-11 DIAGNOSIS — M545 Low back pain, unspecified: Secondary | ICD-10-CM

## 2023-02-11 DIAGNOSIS — K449 Diaphragmatic hernia without obstruction or gangrene: Secondary | ICD-10-CM | POA: Diagnosis not present

## 2023-02-11 DIAGNOSIS — G8929 Other chronic pain: Secondary | ICD-10-CM | POA: Diagnosis not present

## 2023-02-11 LAB — COMPREHENSIVE METABOLIC PANEL
ALT: 11 U/L (ref 0–35)
AST: 16 U/L (ref 0–37)
Albumin: 4 g/dL (ref 3.5–5.2)
Alkaline Phosphatase: 51 U/L (ref 39–117)
BUN: 17 mg/dL (ref 6–23)
CO2: 28 mEq/L (ref 19–32)
Calcium: 8.8 mg/dL (ref 8.4–10.5)
Chloride: 104 mEq/L (ref 96–112)
Creatinine, Ser: 1.05 mg/dL (ref 0.40–1.20)
GFR: 48.02 mL/min — ABNORMAL LOW (ref >60.00)
Glucose, Bld: 90 mg/dL (ref 70–99)
Potassium: 4.1 mEq/L (ref 3.5–5.1)
Sodium: 140 mEq/L (ref 135–145)
Total Bilirubin: 0.5 mg/dL (ref 0.2–1.2)
Total Protein: 7.2 g/dL (ref 6.0–8.3)

## 2023-02-11 LAB — URINALYSIS
Bilirubin Urine: NEGATIVE
Hgb urine dipstick: NEGATIVE
Ketones, ur: NEGATIVE
Leukocytes,Ua: NEGATIVE
Nitrite: NEGATIVE
Specific Gravity, Urine: 1.015 (ref 1.000–1.030)
Total Protein, Urine: NEGATIVE
Urine Glucose: NEGATIVE
Urobilinogen, UA: 0.2 (ref 0.0–1.0)
pH: 7.5 (ref 5.0–8.0)

## 2023-02-11 LAB — CBC WITH DIFFERENTIAL/PLATELET
Basophils Absolute: 0 10*3/uL (ref 0.0–0.1)
Basophils Relative: 0.6 % (ref 0.0–3.0)
Eosinophils Absolute: 0.1 10*3/uL (ref 0.0–0.7)
Eosinophils Relative: 0.9 % (ref 0.0–5.0)
HCT: 35.4 % — ABNORMAL LOW (ref 36.0–46.0)
Hemoglobin: 11.7 g/dL — ABNORMAL LOW (ref 12.0–15.0)
Lymphocytes Relative: 37 % (ref 12.0–46.0)
Lymphs Abs: 2.2 10*3/uL (ref 0.7–4.0)
MCHC: 33.2 g/dL (ref 30.0–36.0)
MCV: 90.1 fl (ref 78.0–100.0)
Monocytes Absolute: 0.5 10*3/uL (ref 0.1–1.0)
Monocytes Relative: 9 % (ref 3.0–12.0)
Neutro Abs: 3.1 10*3/uL (ref 1.4–7.7)
Neutrophils Relative %: 52.5 % (ref 43.0–77.0)
Platelets: 276 10*3/uL (ref 150.0–400.0)
RBC: 3.93 Mil/uL (ref 3.87–5.11)
RDW: 12.9 % (ref 11.5–15.5)
WBC: 5.8 10*3/uL (ref 4.0–10.5)

## 2023-02-11 NOTE — Assessment & Plan Note (Signed)
S/p remote oral surgery - oral surgery appt pending for a f/u. Prolia was on hold - will re-start

## 2023-02-11 NOTE — Assessment & Plan Note (Signed)
Chronic - due to scoliosis Use a walker

## 2023-02-11 NOTE — Assessment & Plan Note (Signed)
New - ?etiology CXR Check UA, CBC, CMET, TSH

## 2023-02-11 NOTE — Progress Notes (Signed)
Subjective:  Patient ID: Sinclair Ship, female    DOB: 08-04-1934  Age: 89 y.o. MRN: [id number]  CC: Follow-up (3 mnth f/u)   HPI Kyerra Mabie presents for OA, LBP, osteoporosis  C/o night sweats x 2 episodes: recent  Outpatient Medications Prior to Visit  Medication Sig Dispense Refill   B-Complex CAPS Take by mouth.     calcium-vitamin D (OSCAL WITH D) 500-200 MG-UNIT tablet Take 1 tablet by mouth 2 (two) times daily. 100 tablet 3   chlorhexidine (PERIDEX) 0.12 % solution SMARTSIG:By Mouth     Cholecalciferol 1000 UNITS tablet Take 1 tablet (1,000 Units total) by mouth daily. 100 tablet 3   denosumab (PROLIA) 60 MG/ML SOSY injection Inject 60 mg into the skin every 6 (six) months.     diclofenac Sodium (VOLTAREN) 1 % GEL Apply 2 g topically 4 (four) times daily. To the L hip are 300 g 1   Incontinence Supply Disposable (DEPEND FITTED BRIEFS SM/MED) MISC As directed 100 each 11   Incontinence Supply Disposable (UNDERPADS REGULAR) MISC As directed 40 each 5   loperamide (IMODIUM A-D) 2 MG tablet Take 1-2 tablets (2-4 mg total) by mouth 4 (four) times daily as needed for diarrhea or loose stools. 60 tablet 1   Magnesium 250 MG TABS Take 1 tablet (250 mg total) by mouth daily. 100 tablet 3   meclizine (ANTIVERT) 12.5 MG tablet Take 12.5-25 mg by mouth 3 (three) times daily as needed.     omeprazole (PRILOSEC) 40 MG capsule Take 1 capsule (40 mg total) by mouth daily. 90 capsule 3   triamcinolone ointment (KENALOG) 0.5 % Apply 1 application topically 2 (two) times daily. 90 g 3   doxycycline (VIBRAMYCIN) 100 MG capsule Take by mouth 2 (two) times daily. (Patient not taking: Reported on 02/11/2023)     No facility-administered medications prior to visit.    ROS: Review of Systems  Constitutional:  Negative for activity change, appetite change, chills, fatigue and unexpected weight change.  HENT:  Negative for congestion, mouth sores and sinus pressure.   Eyes:  Negative for visual  disturbance.  Respiratory:  Negative for cough and chest tightness.   Gastrointestinal:  Negative for abdominal pain and nausea.  Genitourinary:  Negative for difficulty urinating, frequency and vaginal pain.  Musculoskeletal:  Positive for arthralgias, back pain and gait problem.  Skin:  Negative for pallor and rash.  Neurological:  Negative for dizziness, tremors, weakness, numbness and headaches.  Psychiatric/Behavioral:  Negative for confusion, sleep disturbance and suicidal ideas. The patient is nervous/anxious.     Objective:  BP 120/78 (BP Location: Right Arm, Patient Position: Sitting, Cuff Size: Large)   Pulse 72   Temp 98.6 F (37 C) (Oral)   Ht 4' 6.5" (1.384 m)   Wt 128 lb (58.1 kg)   SpO2 96%   BMI 30.30 kg/m   BP Readings from Last 3 Encounters:  02/11/23 120/78  11/13/22 120/78  08/13/22 128/78    Wt Readings from Last 3 Encounters:  02/11/23 128 lb (58.1 kg)  11/13/22 128 lb (58.1 kg)  08/13/22 127 lb (57.6 kg)    Physical Exam Constitutional:      General: She is not in acute distress.    Appearance: Normal appearance. She is well-developed.  HENT:     Head: Normocephalic.     Right Ear: External ear normal.     Left Ear: External ear normal.     Nose: Nose normal.  Eyes:  General:        Right eye: No discharge.        Left eye: No discharge.     Conjunctiva/sclera: Conjunctivae normal.     Pupils: Pupils are equal, round, and reactive to light.  Neck:     Thyroid: No thyromegaly.     Vascular: No JVD.     Trachea: No tracheal deviation.  Cardiovascular:     Rate and Rhythm: Normal rate and regular rhythm.     Heart sounds: Normal heart sounds.  Pulmonary:     Effort: No respiratory distress.     Breath sounds: No stridor. No wheezing.  Abdominal:     General: Bowel sounds are normal. There is no distension.     Palpations: Abdomen is soft. There is no mass.     Tenderness: There is no abdominal tenderness. There is no guarding or  rebound.  Musculoskeletal:        General: No tenderness.     Cervical back: Normal range of motion and neck supple. No rigidity.  Lymphadenopathy:     Cervical: No cervical adenopathy.  Skin:    Findings: No erythema or rash.  Neurological:     Mental Status: She is oriented to person, place, and time.     Cranial Nerves: No cranial nerve deficit.     Motor: No abnormal muscle tone.     Coordination: Coordination normal.     Gait: Gait abnormal.     Deep Tendon Reflexes: Reflexes normal.  Psychiatric:        Behavior: Behavior normal.        Thought Content: Thought content normal.        Judgment: Judgment normal.   Antalgic gait  Lab Results  Component Value Date   WBC 4.8 11/13/2022   HGB 11.2 (L) 11/13/2022   HCT 33.2 (L) 11/13/2022   PLT 220.0 11/13/2022   GLUCOSE 86 11/13/2022   CHOL 219 (H) 11/11/2018   TRIG 128.0 11/11/2018   HDL 50.30 11/11/2018   LDLDIRECT 196.7 12/22/2010   LDLCALC 143 (H) 11/11/2018   ALT 13 11/13/2022   AST 19 11/13/2022   NA 139 11/13/2022   K 3.6 11/13/2022   CL 103 11/13/2022   CREATININE 1.02 11/13/2022   BUN 18 11/13/2022   CO2 28 11/13/2022   TSH 1.93 11/13/2022   INR 0.96 03/24/2010   HGBA1C 5.7 04/26/2010    MM 3D SCREEN BREAST BILATERAL  Result Date: 02/07/2022 CLINICAL DATA:  Screening. EXAM: DIGITAL SCREENING BILATERAL MAMMOGRAM WITH TOMOSYNTHESIS AND CAD TECHNIQUE: Bilateral screening digital craniocaudal and mediolateral oblique mammograms were obtained. Bilateral screening digital breast tomosynthesis was performed. The images were evaluated with computer-aided detection. COMPARISON:  Previous exam(s). ACR Breast Density Category c: The breast tissue is heterogeneously dense, which may obscure small masses. FINDINGS: There are no findings suspicious for malignancy. IMPRESSION: No mammographic evidence of malignancy. A result letter of this screening mammogram will be mailed directly to the patient. RECOMMENDATION: Screening  mammogram in one year. (Code:SM-B-01Y) BI-RADS CATEGORY  1: Negative. Electronically Signed   By: Ted Mcalpine M.D.   On: 02/07/2022 16:33    Assessment & Plan:   Problem List Items Addressed This Visit     LOW BACK PAIN    Chronic - due to scoliosis Use a walker      Relevant Orders   TSH   Urinalysis   CBC with Differential/Platelet   Comprehensive metabolic panel   DG Chest 2 View  Osteoporosis - Primary    S/p remote oral surgery - oral surgery appt pending for a f/u. Prolia was on hold - will re-start      Night sweats    New - ?etiology CXR Check UA, CBC, CMET, TSH      Relevant Orders   TSH   Urinalysis   CBC with Differential/Platelet   Comprehensive metabolic panel   DG Chest 2 View      No orders of the defined types were placed in this encounter.     Follow-up: Return in about 3 months (around 05/14/2023) for a follow-up visit.  Sonda Primes, MD

## 2023-02-12 LAB — TSH: TSH: 2.15 u[IU]/mL (ref 0.35–5.50)

## 2023-02-19 ENCOUNTER — Ambulatory Visit (INDEPENDENT_AMBULATORY_CARE_PROVIDER_SITE_OTHER): Payer: Medicare Other

## 2023-02-19 VITALS — BP 130/70 | HR 82 | Temp 98.5°F | Ht <= 58 in | Wt 129.0 lb

## 2023-02-19 DIAGNOSIS — Z0000 Encounter for general adult medical examination without abnormal findings: Secondary | ICD-10-CM | POA: Diagnosis not present
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
## 2023-02-19 NOTE — Patient Instructions (Signed)
Joyce Riley , Thank you for taking time to come for your Medicare Wellness Visit. I appreciate your ongoing commitment to your health goals. Please review the following plan we discussed and let me know if I can assist you in the future.   These are the goals we discussed:  Goals       Patient Stated (pt-stated)      My goal is to be in less pain with my back.        This is a list of the screening recommended for you and due dates:  Health Maintenance  Topic Date Due   DTaP/Tdap/Td vaccine (1 - Tdap) Never done   Zoster (Shingles) Vaccine (2 of 2) 08/17/2017   COVID-19 Vaccine (6 - 2023-24 season) 05/25/2022   Medicare Annual Wellness Visit  11/02/2022   Mammogram  02/08/2023   Flu Shot  04/25/2023   Pneumonia Vaccine  Completed   DEXA scan (bone density measurement)  Completed   HPV Vaccine  Aged Out    Advanced directives: No  Conditions/risks identified: Yes  Next appointment: Follow up in one year for your annual wellness visit.   Preventive Care 57 Years and Older, Female Preventive care refers to lifestyle choices and visits with your health care provider that can promote health and wellness. What does preventive care include? A yearly physical exam. This is also called an annual well check. Dental exams once or twice a year. Routine eye exams. Ask your health care provider how often you should have your eyes checked. Personal lifestyle choices, including: Daily care of your teeth and gums. Regular physical activity. Eating a healthy diet. Avoiding tobacco and drug use. Limiting alcohol use. Practicing safe sex. Taking low-dose aspirin every day. Taking vitamin and mineral supplements as recommended by your health care provider. What happens during an annual well check? The services and screenings done by your health care provider during your annual well check will depend on your age, overall health, lifestyle risk factors, and family history of  disease. Counseling  Your health care provider may ask you questions about your: Alcohol use. Tobacco use. Drug use. Emotional well-being. Home and relationship well-being. Sexual activity. Eating habits. History of falls. Memory and ability to understand (cognition). Work and work Astronomer. Reproductive health. Screening  You may have the following tests or measurements: Height, weight, and BMI. Blood pressure. Lipid and cholesterol levels. These may be checked every 5 years, or more frequently if you are over 109 years old. Skin check. Lung cancer screening. You may have this screening every year starting at age 54 if you have a 30-pack-year history of smoking and currently smoke or have quit within the past 15 years. Fecal occult blood test (FOBT) of the stool. You may have this test every year starting at age 56. Flexible sigmoidoscopy or colonoscopy. You may have a sigmoidoscopy every 5 years or a colonoscopy every 10 years starting at age 29. Hepatitis C blood test. Hepatitis B blood test. Sexually transmitted disease (STD) testing. Diabetes screening. This is done by checking your blood sugar (glucose) after you have not eaten for a while (fasting). You may have this done every 1-3 years. Bone density scan. This is done to screen for osteoporosis. You may have this done starting at age 58. Mammogram. This may be done every 1-2 years. Talk to your health care provider about how often you should have regular mammograms. Talk with your health care provider about your test results, treatment options, and if  necessary, the need for more tests. Vaccines  Your health care provider may recommend certain vaccines, such as: Influenza vaccine. This is recommended every year. Tetanus, diphtheria, and acellular pertussis (Tdap, Td) vaccine. You may need a Td booster every 10 years. Zoster vaccine. You may need this after age 43. Pneumococcal 13-valent conjugate (PCV13) vaccine. One  dose is recommended after age 76. Pneumococcal polysaccharide (PPSV23) vaccine. One dose is recommended after age 56. Talk to your health care provider about which screenings and vaccines you need and how often you need them. This information is not intended to replace advice given to you by your health care provider. Make sure you discuss any questions you have with your health care provider. Document Released: 10/07/2015 Document Revised: 05/30/2016 Document Reviewed: 07/12/2015 Elsevier Interactive Patient Education  2017 [employer].  Fall Prevention in the Home Falls can cause injuries. They can happen to people of all ages. There are many things you can do to make your home safe and to help prevent falls. What can I do on the outside of my home? Regularly fix the edges of walkways and driveways and fix any cracks. Remove anything that might make you trip as you walk through a door, such as a raised step or threshold. Trim any bushes or trees on the path to your home. Use bright outdoor lighting. Clear any walking paths of anything that might make someone trip, such as rocks or tools. Regularly check to see if handrails are loose or broken. Make sure that both sides of any steps have handrails. Any raised decks and porches should have guardrails on the edges. Have any leaves, snow, or ice cleared regularly. Use sand or salt on walking paths during winter. Clean up any spills in your garage right away. This includes oil or grease spills. What can I do in the bathroom? Use night lights. Install grab bars by the toilet and in the tub and shower. Do not use towel bars as grab bars. Use non-skid mats or decals in the tub or shower. If you need to sit down in the shower, use a plastic, non-slip stool. Keep the floor dry. Clean up any water that spills on the floor as soon as it happens. Remove soap buildup in the tub or shower regularly. Attach bath mats securely with double-sided  non-slip rug tape. Do not have throw rugs and other things on the floor that can make you trip. What can I do in the bedroom? Use night lights. Make sure that you have a light by your bed that is easy to reach. Do not use any sheets or blankets that are too big for your bed. They should not hang down onto the floor. Have a firm chair that has side arms. You can use this for support while you get dressed. Do not have throw rugs and other things on the floor that can make you trip. What can I do in the kitchen? Clean up any spills right away. Avoid walking on wet floors. Keep items that you use a lot in easy-to-reach places. If you need to reach something above you, use a strong step stool that has a grab bar. Keep electrical cords out of the way. Do not use floor polish or wax that makes floors slippery. If you must use wax, use non-skid floor wax. Do not have throw rugs and other things on the floor that can make you trip. What can I do with my stairs? Do not leave any items  on the stairs. Make sure that there are handrails on both sides of the stairs and use them. Fix handrails that are broken or loose. Make sure that handrails are as long as the stairways. Check any carpeting to make sure that it is firmly attached to the stairs. Fix any carpet that is loose or worn. Avoid having throw rugs at the top or bottom of the stairs. If you do have throw rugs, attach them to the floor with carpet tape. Make sure that you have a light switch at the top of the stairs and the bottom of the stairs. If you do not have them, ask someone to add them for you. What else can I do to help prevent falls? Wear shoes that: Do not have high heels. Have rubber bottoms. Are comfortable and fit you well. Are closed at the toe. Do not wear sandals. If you use a stepladder: Make sure that it is fully opened. Do not climb a closed stepladder. Make sure that both sides of the stepladder are locked into place. Ask  someone to hold it for you, if possible. Clearly mark and make sure that you can see: Any grab bars or handrails. First and last steps. Where the edge of each step is. Use tools that help you move around (mobility aids) if they are needed. These include: Canes. Walkers. Scooters. Crutches. Turn on the lights when you go into a dark area. Replace any light bulbs as soon as they burn out. Set up your furniture so you have a clear path. Avoid moving your furniture around. If any of your floors are uneven, fix them. If there are any pets around you, be aware of where they are. Review your medicines with your doctor. Some medicines can make you feel dizzy. This can increase your chance of falling. Ask your doctor what other things that you can do to help prevent falls. This information is not intended to replace advice given to you by your health care provider. Make sure you discuss any questions you have with your health care provider. Document Released: 07/07/2009 Document Revised: 02/16/2016 Document Reviewed: 10/15/2014 Elsevier Interactive Patient Education  2017 [employer].

## 2023-02-22 ENCOUNTER — Ambulatory Visit
Admission: RE | Admit: 2023-02-22 | Discharge: 2023-02-22 | Disposition: A | Discharge status: Home / Self Care | Payer: Medicare Other | Source: Ambulatory Visit | Attending: Internal Medicine | Admitting: Internal Medicine

## 2023-02-22 DIAGNOSIS — Z1231 Encounter for screening mammogram for malignant neoplasm of breast: Secondary | ICD-10-CM | POA: Diagnosis not present

## 2023-02-28 ENCOUNTER — Encounter: Payer: Self-pay | Admitting: Internal Medicine

## 2023-02-28 LAB — HM MAMMOGRAPHY

## 2023-04-17 ENCOUNTER — Encounter: Payer: Self-pay | Admitting: Podiatry

## 2023-04-17 ENCOUNTER — Ambulatory Visit (INDEPENDENT_AMBULATORY_CARE_PROVIDER_SITE_OTHER): Payer: Medicare Other | Admitting: Podiatry

## 2023-04-17 DIAGNOSIS — B351 Tinea unguium: Secondary | ICD-10-CM

## 2023-04-17 DIAGNOSIS — M79674 Pain in right toe(s): Secondary | ICD-10-CM | POA: Diagnosis not present

## 2023-04-17 DIAGNOSIS — M79675 Pain in left toe(s): Secondary | ICD-10-CM

## 2023-04-24 ENCOUNTER — Encounter (INDEPENDENT_AMBULATORY_CARE_PROVIDER_SITE_OTHER): Payer: Self-pay

## 2023-04-24 NOTE — Progress Notes (Signed)
  Subjective:  Patient ID: Joyce Riley, female    DOB: 06-05-1934,  MRN: [id number]  89 y.o. female presents to clinic with painful thick toenails that are difficult to trim. Pain interferes with ambulation. Aggravating factors include wearing enclosed shoe gear. Pain is relieved with periodic professional debridement. She is accompanied by her [place] Interpreter on today's visit. She does have a callus on the plantar aspect of her right foot, but does not want it trimmed if her insurance does not cover it. Chief Complaint  Patient presents with   NAIL CARE    RFC     New problem(s): None   PCP is Plotnikov, Georgina Quint, MD.  Allergies  Allergen Reactions   Aspirin     bruising   Atorvastatin     REACTION: myalgia   Codeine Other (See Comments)   Meclizine Hcl     REACTION: rash   Milk-Related Compounds    Simvastatin     Dizzy, vomiting    Review of Systems: Negative except as noted in the HPI.   Objective:  Joyce Riley is a pleasant 89 y.o. female WD, WN in NAD.Marland Kitchen  Vascular Examination: Vascular status intact b/l with palpable pedal pulses. CFT immediate b/l. No edema. No pain with calf compression b/l. Skin temperature gradient WNL b/l. +1 pitting edema noted BLE. Varicosities present b/l. No cyanosis or clubbing noted b/l LE.  Neurological Examination: Sensation grossly intact b/l with 10 gram monofilament. Vibratory sensation intact b/l.   Dermatological Examination: Pedal skin with normal turgor, texture and tone b/l. Toenails 1-5 b/l thick, discolored, elongated with subungual debris and pain on dorsal palpation. No hyperkeratotic lesions noted b/l. Hyperkeratotic lesion(s) submet head 3 left foot.  No erythema, no edema, no drainage, no fluctuance.  Musculoskeletal Examination: Muscle strength 5/5 to b/l LE. HAV with bunion deformity noted b/l LE.  Radiographs: None  Assessment:   1. Pain due to onychomycosis of toenails of both feet    Plan:   -Patient's primary language is [place]. Patient seen with assistance of in-person interpreter on today's visit.  -Patient was evaluated and treated. All patient's and/or POA's questions/concerns answered on today's visit. -Medicare ABN on file for refusal of paring of corn(s)/callus(es)/porokeratos(es). Copy in patient chart. -Patient to continue soft, supportive shoe gear daily. -Toenails 1-5 b/l were debrided in length and girth with sterile nail nippers and dremel without iatrogenic bleeding.  -Patient/POA to call should there be question/concern in the interim.  Return in about 9 weeks (around 06/19/2023).  Freddie Breech, DPM

## 2023-05-14 ENCOUNTER — Encounter: Payer: Self-pay | Admitting: Internal Medicine

## 2023-05-14 ENCOUNTER — Ambulatory Visit (INDEPENDENT_AMBULATORY_CARE_PROVIDER_SITE_OTHER): Payer: Medicare Other | Admitting: Internal Medicine

## 2023-05-14 VITALS — BP 118/72 | HR 75 | Temp 98.2°F | Ht <= 58 in | Wt 129.0 lb

## 2023-05-14 DIAGNOSIS — R634 Abnormal weight loss: Secondary | ICD-10-CM

## 2023-05-14 DIAGNOSIS — M810 Age-related osteoporosis without current pathological fracture: Secondary | ICD-10-CM | POA: Diagnosis not present

## 2023-05-14 DIAGNOSIS — E538 Deficiency of other specified B group vitamins: Secondary | ICD-10-CM

## 2023-05-14 DIAGNOSIS — R152 Fecal urgency: Secondary | ICD-10-CM

## 2023-05-14 DIAGNOSIS — R110 Nausea: Secondary | ICD-10-CM | POA: Diagnosis not present

## 2023-05-14 LAB — CBC WITH DIFFERENTIAL/PLATELET
Basophils Absolute: 0 10*3/uL (ref 0.0–0.1)
Basophils Relative: 0.6 % (ref 0.0–3.0)
Eosinophils Absolute: 0.1 10*3/uL (ref 0.0–0.7)
Eosinophils Relative: 1 % (ref 0.0–5.0)
HCT: 33.7 % — ABNORMAL LOW (ref 36.0–46.0)
Hemoglobin: 11 g/dL — ABNORMAL LOW (ref 12.0–15.0)
Lymphocytes Relative: 32.6 % (ref 12.0–46.0)
Lymphs Abs: 1.9 10*3/uL (ref 0.7–4.0)
MCHC: 32.6 g/dL (ref 30.0–36.0)
MCV: 89.8 fl (ref 78.0–100.0)
Monocytes Absolute: 0.5 10*3/uL (ref 0.1–1.0)
Monocytes Relative: 8.7 % (ref 3.0–12.0)
Neutro Abs: 3.3 10*3/uL (ref 1.4–7.7)
Neutrophils Relative %: 57.1 % (ref 43.0–77.0)
Platelets: 221 10*3/uL (ref 150.0–400.0)
RBC: 3.75 Mil/uL — ABNORMAL LOW (ref 3.87–5.11)
RDW: 13.1 % (ref 11.5–15.5)
WBC: 5.8 10*3/uL (ref 4.0–10.5)

## 2023-05-14 LAB — COMPREHENSIVE METABOLIC PANEL
ALT: 9 U/L (ref 0–35)
AST: 15 U/L (ref 0–37)
Albumin: 4 g/dL (ref 3.5–5.2)
Alkaline Phosphatase: 54 U/L (ref 39–117)
BUN: 17 mg/dL (ref 6–23)
CO2: 29 mEq/L (ref 19–32)
Calcium: 8.8 mg/dL (ref 8.4–10.5)
Chloride: 104 mEq/L (ref 96–112)
Creatinine, Ser: 1.02 mg/dL (ref 0.40–1.20)
GFR: 49.63 mL/min — ABNORMAL LOW (ref >60.00)
Glucose, Bld: 109 mg/dL — ABNORMAL HIGH (ref 70–99)
Potassium: 3.8 mEq/L (ref 3.5–5.1)
Sodium: 140 mEq/L (ref 135–145)
Total Bilirubin: 0.4 mg/dL (ref 0.2–1.2)
Total Protein: 7.2 g/dL (ref 6.0–8.3)

## 2023-05-14 MED ORDER — ONDANSETRON HCL 4 MG PO TABS: 4.0000 mg | ORAL_TABLET | Freq: Three times a day (TID) | ORAL | 1 refills | Status: DC | PRN

## 2023-05-14 NOTE — Assessment & Plan Note (Signed)
On B12 

## 2023-05-14 NOTE — Progress Notes (Signed)
Subjective:  Patient ID: Joyce Riley, female    DOB: 05-Sep-1934  Age: 89 y.o. MRN: [id number]  CC: Follow-up (3 mnth f/u)   HPI Joyce Riley presents for nausea when trying to see doctors fasting. C/o urgent stools if she eats... Long time. No diarrhea; just small stools.Marland KitchenMarland KitchenOccasional loss of bowel control F/u osteoporosis  Outpatient Medications Prior to Visit  Medication Sig Dispense Refill   B-Complex CAPS Take by mouth.     calcium-vitamin D (OSCAL WITH D) 500-200 MG-UNIT tablet Take 1 tablet by mouth 2 (two) times daily. 100 tablet 3   chlorhexidine (PERIDEX) 0.12 % solution SMARTSIG:By Mouth     Cholecalciferol 1000 UNITS tablet Take 1 tablet (1,000 Units total) by mouth daily. 100 tablet 3   denosumab (PROLIA) 60 MG/ML SOSY injection Inject 60 mg into the skin every 6 (six) months.     diclofenac Sodium (VOLTAREN) 1 % GEL Apply 2 g topically 4 (four) times daily. To the L hip are 300 g 1   Incontinence Supply Disposable (DEPEND FITTED BRIEFS SM/MED) MISC As directed 100 each 11   Incontinence Supply Disposable (UNDERPADS REGULAR) MISC As directed 40 each 5   loperamide (IMODIUM A-D) 2 MG tablet Take 1-2 tablets (2-4 mg total) by mouth 4 (four) times daily as needed for diarrhea or loose stools. 60 tablet 1   Magnesium 250 MG TABS Take 1 tablet (250 mg total) by mouth daily. 100 tablet 3   meclizine (ANTIVERT) 12.5 MG tablet Take 12.5-25 mg by mouth 3 (three) times daily as needed.     omeprazole (PRILOSEC) 40 MG capsule Take 1 capsule (40 mg total) by mouth daily. 90 capsule 3   triamcinolone ointment (KENALOG) 0.5 % Apply 1 application topically 2 (two) times daily. 90 g 3   doxycycline (VIBRAMYCIN) 100 MG capsule Take by mouth 2 (two) times daily. (Patient not taking: Reported on 02/11/2023)     No facility-administered medications prior to visit.    ROS: Review of Systems  Constitutional:  Negative for activity change, appetite change, chills, fatigue and unexpected  weight change.  HENT:  Negative for congestion, mouth sores and sinus pressure.   Eyes:  Negative for visual disturbance.  Respiratory:  Negative for cough and chest tightness.   Gastrointestinal:  Positive for nausea. Negative for abdominal pain, blood in stool, diarrhea and vomiting.  Genitourinary:  Negative for difficulty urinating, frequency and vaginal pain.  Musculoskeletal:  Negative for back pain and gait problem.  Skin:  Negative for pallor and rash.  Neurological:  Negative for dizziness, tremors, weakness, numbness and headaches.  Psychiatric/Behavioral:  Negative for confusion and sleep disturbance.     Objective:  BP 118/72 (BP Location: Right Arm, Patient Position: Sitting, Cuff Size: Normal)   Pulse 75   Temp 98.2 F (36.8 C) (Oral)   Ht 4' 6.5" (1.384 m)   Wt 129 lb (58.5 kg)   SpO2 95%   BMI 30.54 kg/m   BP Readings from Last 3 Encounters:  05/14/23 118/72  02/19/23 130/70  02/11/23 120/78    Wt Readings from Last 3 Encounters:  05/14/23 129 lb (58.5 kg)  02/19/23 129 lb (58.5 kg)  02/11/23 128 lb (58.1 kg)    Physical Exam Constitutional:      General: She is not in acute distress.    Appearance: She is well-developed.  HENT:     Head: Normocephalic.     Right Ear: External ear normal.     Left Ear: External ear  normal.     Nose: Nose normal.  Eyes:     General:        Right eye: No discharge.        Left eye: No discharge.     Conjunctiva/sclera: Conjunctivae normal.     Pupils: Pupils are equal, round, and reactive to light.  Neck:     Thyroid: No thyromegaly.     Vascular: No JVD.     Trachea: No tracheal deviation.  Cardiovascular:     Rate and Rhythm: Normal rate and regular rhythm.     Heart sounds: Normal heart sounds.  Pulmonary:     Effort: No respiratory distress.     Breath sounds: No stridor. No wheezing.  Abdominal:     General: Bowel sounds are normal. There is no distension.     Palpations: Abdomen is soft. There is no  mass.     Tenderness: There is no abdominal tenderness. There is no guarding or rebound.  Musculoskeletal:        General: No tenderness.     Cervical back: Normal range of motion and neck supple. No rigidity.  Lymphadenopathy:     Cervical: No cervical adenopathy.  Skin:    Findings: No erythema or rash.  Neurological:     Cranial Nerves: No cranial nerve deficit.     Motor: No abnormal muscle tone.     Coordination: Coordination normal.     Deep Tendon Reflexes: Reflexes normal.  Psychiatric:        Behavior: Behavior normal.        Thought Content: Thought content normal.        Judgment: Judgment normal.   Looks well  Lab Results  Component Value Date   WBC 5.8 02/11/2023   HGB 11.7 (L) 02/11/2023   HCT 35.4 (L) 02/11/2023   PLT 276.0 02/11/2023   GLUCOSE 90 02/11/2023   CHOL 219 (H) 11/11/2018   TRIG 128.0 11/11/2018   HDL 50.30 11/11/2018   LDLDIRECT 196.7 12/22/2010   LDLCALC 143 (H) 11/11/2018   ALT 11 02/11/2023   AST 16 02/11/2023   NA 140 02/11/2023   K 4.1 02/11/2023   CL 104 02/11/2023   CREATININE 1.05 02/11/2023   BUN 17 02/11/2023   CO2 28 02/11/2023   TSH 2.15 02/11/2023   INR 0.96 03/24/2010   HGBA1C 5.7 04/26/2010    MM 3D SCREENING MAMMOGRAM BILATERAL BREAST  Result Date: 02/25/2023 CLINICAL DATA:  Screening. EXAM: DIGITAL SCREENING BILATERAL MAMMOGRAM WITH TOMOSYNTHESIS AND CAD TECHNIQUE: Bilateral screening digital craniocaudal and mediolateral oblique mammograms were obtained. Bilateral screening digital breast tomosynthesis was performed. The images were evaluated with computer-aided detection. COMPARISON:  Previous exam(s). ACR Breast Density Category c: The breasts are heterogeneously dense, which may obscure small masses. FINDINGS: There are no findings suspicious for malignancy. IMPRESSION: No mammographic evidence of malignancy. A result letter of this screening mammogram will be mailed directly to the patient. RECOMMENDATION: Screening  mammogram in one year. (Code:SM-B-01Y) BI-RADS CATEGORY  1: Negative. Electronically Signed   By: Frederico Hamman M.D.   On: 02/25/2023 15:57    Assessment & Plan:   Problem List Items Addressed This Visit     B12 deficiency - Primary    On B12      Relevant Orders   CBC with Differential/Platelet   Osteoporosis    OK to re-start Prolia      Weight loss    Resolved      Fecal urgency  C/o urgent stools if she eats... Long time. No diarrhea; just small stools.Marland KitchenMarland KitchenOccasional loss of bowel control      Relevant Orders   CBC with Differential/Platelet   Comprehensive metabolic panel   Nausea    Nausea when trying to see doctors fasting. Use Zofran prn      Relevant Orders   CBC with Differential/Platelet   Comprehensive metabolic panel      Meds ordered this encounter  Medications   ondansetron (ZOFRAN) 4 MG tablet    Sig: Take 1 tablet (4 mg total) by mouth every 8 (eight) hours as needed for nausea or vomiting.    Dispense:  20 tablet    Refill:  1      Follow-up: Return in about 4 months (around 09/13/2023) for a follow-up visit.  Sonda Primes, MD

## 2023-05-14 NOTE — Assessment & Plan Note (Signed)
Resolved

## 2023-05-14 NOTE — Assessment & Plan Note (Signed)
OK to re-start Prolia

## 2023-05-14 NOTE — Assessment & Plan Note (Signed)
Nausea when trying to see doctors fasting. Use Zofran prn

## 2023-05-14 NOTE — Assessment & Plan Note (Signed)
C/o urgent stools if she eats... Long time. No diarrhea; just small stools.Marland KitchenMarland KitchenOccasional loss of bowel control

## 2023-05-15 DIAGNOSIS — H40023 Open angle with borderline findings, high risk, bilateral: Secondary | ICD-10-CM | POA: Diagnosis not present

## 2023-05-15 DIAGNOSIS — H04123 Dry eye syndrome of bilateral lacrimal glands: Secondary | ICD-10-CM | POA: Diagnosis not present

## 2023-05-15 DIAGNOSIS — D23121 Other benign neoplasm of skin of left upper eyelid, including canthus: Secondary | ICD-10-CM | POA: Diagnosis not present

## 2023-06-07 DIAGNOSIS — Z23 Encounter for immunization: Secondary | ICD-10-CM | POA: Diagnosis not present

## 2023-07-09 DIAGNOSIS — Z23 Encounter for immunization: Secondary | ICD-10-CM | POA: Diagnosis not present

## 2023-07-23 ENCOUNTER — Encounter: Payer: Self-pay | Admitting: Podiatry

## 2023-07-23 ENCOUNTER — Ambulatory Visit (INDEPENDENT_AMBULATORY_CARE_PROVIDER_SITE_OTHER): Payer: Medicare Other | Admitting: Podiatry

## 2023-07-23 VITALS — Ht <= 58 in | Wt 129.0 lb

## 2023-07-23 DIAGNOSIS — M79675 Pain in left toe(s): Secondary | ICD-10-CM | POA: Diagnosis not present

## 2023-07-23 DIAGNOSIS — B351 Tinea unguium: Secondary | ICD-10-CM

## 2023-07-23 DIAGNOSIS — M79674 Pain in right toe(s): Secondary | ICD-10-CM | POA: Diagnosis not present

## 2023-07-28 NOTE — Progress Notes (Signed)
  Subjective:  Patient ID: Joyce Riley, female    DOB: [birth year],  MRN: [id number]  89 y.o. female presents painful elongated mycotic toenails 1-5 bilaterally which are tender when wearing enclosed shoe gear. Pain is relieved with periodic professional debridement. She is accompanied by her [place], [place], on today's visit. Chief Complaint  Patient presents with   Nail Problem    RFC, Pt is not a diabetic, she's unsure of her last office viist, PCP is Dr Posey Rea.    New problem(s): None   PCP is Plotnikov, Georgina Quint, MD , and last visit was May 14, 2023.  Allergies  Allergen Reactions   Aspirin     bruising   Atorvastatin     REACTION: myalgia   Codeine Other (See Comments)   Meclizine Hcl     REACTION: rash   Milk-Related Compounds    Simvastatin     Dizzy, vomiting    Review of Systems: Negative except as noted in the HPI.   Objective:  Joyce Riley is a pleasant 89 y.o. female WD, WN in NAD. AAO x 3.  Vascular Examination: Vascular status intact b/l with palpable pedal pulses. CFT immediate b/l. Pedal hair present. Trace edema. No pain with calf compression b/l. Skin temperature gradient WNL b/l. No cyanosis or clubbing noted. Varicosities present b/l.   Neurological Examination: Sensation grossly intact b/l with 10 gram monofilament. Vibratory sensation intact b/l.  Dermatological Examination: Pedal skin with normal turgor, texture and tone b/l. No open wounds nor interdigital macerations noted. Toenails 1-5 b/l thick, discolored, elongated with subungual debris and pain on dorsal palpation. Mild hyperkeratotic lesion submet head 3 right foot. No erythema, no edema, no drainage, no fluctuance.  Musculoskeletal Examination: Muscle strength 5/5 to b/l LE.  No pain, crepitus noted b/l. HAV with bunion deformity noted b/l LE.  Radiographs: None  Last A1c:       No data to display         Assessment:   1. Pain due to onychomycosis of  toenails of both feet    Plan:  -Patient's primary language is [place]. Patient seen with assistance of in-person interpreter, [place], on today's visit.  -Consent given for treatment as described below: -Examined patient. -Continue supportive shoe gear daily. -Mycotic toenails 1-5 bilaterally were debrided in length and girth with sterile nail nippers and dremel without incident. -As a courtesy, callus(es) submet head 3 right foot pared utilizing sterile scalpel blade without complication or incident. Total number pared=1. -Patient/POA to call should there be question/concern in the interim.  Return in about 3 months (around 10/23/2023).  Freddie Breech, DPM

## 2023-08-14 DIAGNOSIS — H40023 Open angle with borderline findings, high risk, bilateral: Secondary | ICD-10-CM | POA: Diagnosis not present

## 2023-08-14 DIAGNOSIS — H35363 Drusen (degenerative) of macula, bilateral: Secondary | ICD-10-CM | POA: Diagnosis not present

## 2023-08-14 DIAGNOSIS — H04123 Dry eye syndrome of bilateral lacrimal glands: Secondary | ICD-10-CM | POA: Diagnosis not present

## 2023-08-14 DIAGNOSIS — H35373 Puckering of macula, bilateral: Secondary | ICD-10-CM | POA: Diagnosis not present

## 2023-09-12 ENCOUNTER — Telehealth: Payer: Self-pay

## 2023-09-12 ENCOUNTER — Encounter: Payer: Self-pay | Admitting: Internal Medicine

## 2023-09-12 ENCOUNTER — Ambulatory Visit: Payer: Medicare Other | Admitting: Internal Medicine

## 2023-09-12 VITALS — BP 120/70 | HR 67 | Temp 98.3°F | Ht <= 58 in | Wt 127.0 lb

## 2023-09-12 DIAGNOSIS — R152 Fecal urgency: Secondary | ICD-10-CM

## 2023-09-12 DIAGNOSIS — E559 Vitamin D deficiency, unspecified: Secondary | ICD-10-CM

## 2023-09-12 DIAGNOSIS — M810 Age-related osteoporosis without current pathological fracture: Secondary | ICD-10-CM | POA: Diagnosis not present

## 2023-09-12 DIAGNOSIS — E538 Deficiency of other specified B group vitamins: Secondary | ICD-10-CM

## 2023-09-12 DIAGNOSIS — R32 Unspecified urinary incontinence: Secondary | ICD-10-CM | POA: Diagnosis not present

## 2023-09-12 DIAGNOSIS — R252 Cramp and spasm: Secondary | ICD-10-CM | POA: Diagnosis not present

## 2023-09-12 LAB — COMPREHENSIVE METABOLIC PANEL
ALT: 10 U/L (ref 0–35)
AST: 16 U/L (ref 0–37)
Albumin: 4.1 g/dL (ref 3.5–5.2)
Alkaline Phosphatase: 46 U/L (ref 39–117)
BUN: 14 mg/dL (ref 6–23)
CO2: 27 meq/L (ref 19–32)
Calcium: 9.1 mg/dL (ref 8.4–10.5)
Chloride: 102 meq/L (ref 96–112)
Creatinine, Ser: 1.05 mg/dL (ref 0.40–1.20)
GFR: 47.82 mL/min — ABNORMAL LOW (ref >60.00)
Glucose, Bld: 91 mg/dL (ref 70–99)
Potassium: 4 meq/L (ref 3.5–5.1)
Sodium: 138 meq/L (ref 135–145)
Total Bilirubin: 0.4 mg/dL (ref 0.2–1.2)
Total Protein: 7.2 g/dL (ref 6.0–8.3)

## 2023-09-12 LAB — CBC WITH DIFFERENTIAL/PLATELET
Basophils Absolute: 0 10*3/uL (ref 0.0–0.1)
Basophils Relative: 0.7 % (ref 0.0–3.0)
Eosinophils Absolute: 0.1 10*3/uL (ref 0.0–0.7)
Eosinophils Relative: 1 % (ref 0.0–5.0)
HCT: 33.6 % — ABNORMAL LOW (ref 36.0–46.0)
Hemoglobin: 11.3 g/dL — ABNORMAL LOW (ref 12.0–15.0)
Lymphocytes Relative: 29.1 % (ref 12.0–46.0)
Lymphs Abs: 1.6 10*3/uL (ref 0.7–4.0)
MCHC: 33.5 g/dL (ref 30.0–36.0)
MCV: 89.6 fL (ref 78.0–100.0)
Monocytes Absolute: 0.5 10*3/uL (ref 0.1–1.0)
Monocytes Relative: 8.7 % (ref 3.0–12.0)
Neutro Abs: 3.4 10*3/uL (ref 1.4–7.7)
Neutrophils Relative %: 60.5 % (ref 43.0–77.0)
Platelets: 252 10*3/uL (ref 150.0–400.0)
RBC: 3.76 Mil/uL — ABNORMAL LOW (ref 3.87–5.11)
RDW: 13 % (ref 11.5–15.5)
WBC: 5.7 10*3/uL (ref 4.0–10.5)

## 2023-09-12 LAB — TSH: TSH: 1.8 u[IU]/mL (ref 0.35–5.50)

## 2023-09-12 NOTE — Patient Instructions (Signed)
Take Imodium AD (one tablet) before you go out to prevent loose stools

## 2023-09-12 NOTE — Assessment & Plan Note (Signed)
Re-start Prolia Vit D and calcium, Mg

## 2023-09-12 NOTE — Assessment & Plan Note (Signed)
Pt has Rx for supplies

## 2023-09-12 NOTE — Telephone Encounter (Signed)
Prolia VOB initiated via [email or web address] 

## 2023-09-12 NOTE — Progress Notes (Signed)
Subjective:  Patient ID: Joyce Riley, female    DOB: 1934/08/17  Age: 89 y.o. MRN: [id number]  CC: Medical Management of Chronic Issues (4 mnth f/u)   HPI Javonda Essig presents for osteoporosis, GERD  Outpatient Medications Prior to Visit  Medication Sig Dispense Refill   B-Complex CAPS Take by mouth.     calcium-vitamin D (OSCAL WITH D) 500-200 MG-UNIT tablet Take 1 tablet by mouth 2 (two) times daily. 100 tablet 3   chlorhexidine (PERIDEX) 0.12 % solution SMARTSIG:By Mouth     Cholecalciferol 1000 UNITS tablet Take 1 tablet (1,000 Units total) by mouth daily. 100 tablet 3   denosumab (PROLIA) 60 MG/ML SOSY injection Inject 60 mg into the skin every 6 (six) months.     diclofenac Sodium (VOLTAREN) 1 % GEL Apply 2 g topically 4 (four) times daily. To the L hip are 300 g 1   Incontinence Supply Disposable (DEPEND FITTED BRIEFS SM/MED) MISC As directed 100 each 11   Incontinence Supply Disposable (UNDERPADS REGULAR) MISC As directed 40 each 5   loperamide (IMODIUM A-D) 2 MG tablet Take 1-2 tablets (2-4 mg total) by mouth 4 (four) times daily as needed for diarrhea or loose stools. 60 tablet 1   Magnesium 250 MG TABS Take 1 tablet (250 mg total) by mouth daily. 100 tablet 3   meclizine (ANTIVERT) 12.5 MG tablet Take 12.5-25 mg by mouth 3 (three) times daily as needed.     omeprazole (PRILOSEC) 40 MG capsule Take 1 capsule (40 mg total) by mouth daily. 90 capsule 3   ondansetron (ZOFRAN) 4 MG tablet Take 1 tablet (4 mg total) by mouth every 8 (eight) hours as needed for nausea or vomiting. 20 tablet 1   triamcinolone ointment (KENALOG) 0.5 % Apply 1 application topically 2 (two) times daily. 90 g 3   doxycycline (VIBRAMYCIN) 100 MG capsule Take by mouth 2 (two) times daily.     No facility-administered medications prior to visit.    ROS: Review of Systems  Constitutional:  Negative for activity change, appetite change, chills, fatigue and unexpected weight change.  HENT:   Negative for congestion, mouth sores and sinus pressure.   Eyes:  Negative for visual disturbance.  Respiratory:  Negative for cough and chest tightness.   Gastrointestinal:  Positive for abdominal distention. Negative for abdominal pain and nausea.  Genitourinary:  Negative for difficulty urinating, frequency and vaginal pain.  Musculoskeletal:  Positive for arthralgias, back pain and gait problem.  Skin:  Negative for pallor and rash.  Neurological:  Negative for dizziness, tremors, weakness, numbness and headaches.  Psychiatric/Behavioral:  Positive for decreased concentration. Negative for confusion, sleep disturbance and suicidal ideas.     Objective:  BP 120/70 (BP Location: Left Arm, Patient Position: Sitting, Cuff Size: Normal)   Pulse 67   Temp 98.3 F (36.8 C) (Oral)   Ht 4' 6.5" (1.384 m)   Wt 127 lb (57.6 kg)   SpO2 97%   BMI 30.06 kg/m   BP Readings from Last 3 Encounters:  09/12/23 120/70  05/14/23 118/72  02/19/23 130/70    Wt Readings from Last 3 Encounters:  09/12/23 127 lb (57.6 kg)  07/23/23 129 lb (58.5 kg)  05/14/23 129 lb (58.5 kg)    Physical Exam Constitutional:      General: She is not in acute distress.    Appearance: She is well-developed. She is obese.  HENT:     Head: Normocephalic.     Right Ear: External ear  normal.     Left Ear: External ear normal.     Nose: Nose normal.  Eyes:     General:        Right eye: No discharge.        Left eye: No discharge.     Conjunctiva/sclera: Conjunctivae normal.     Pupils: Pupils are equal, round, and reactive to light.  Neck:     Thyroid: No thyromegaly.     Vascular: No JVD.     Trachea: No tracheal deviation.  Cardiovascular:     Rate and Rhythm: Normal rate and regular rhythm.     Heart sounds: Normal heart sounds.  Pulmonary:     Effort: No respiratory distress.     Breath sounds: No stridor. No wheezing.  Abdominal:     General: Bowel sounds are normal. There is no distension.      Palpations: Abdomen is soft. There is no mass.     Tenderness: There is no abdominal tenderness. There is no guarding or rebound.  Musculoskeletal:        General: No tenderness.     Cervical back: Normal range of motion and neck supple. No rigidity.  Lymphadenopathy:     Cervical: No cervical adenopathy.  Skin:    Findings: No erythema or rash.  Neurological:     Mental Status: She is oriented to person, place, and time.     Cranial Nerves: No cranial nerve deficit.     Motor: No abnormal muscle tone.     Coordination: Coordination normal.     Gait: Gait normal.     Deep Tendon Reflexes: Reflexes normal.  Psychiatric:        Behavior: Behavior normal.        Thought Content: Thought content normal.        Judgment: Judgment normal.    Lab Results  Component Value Date   WBC 5.8 05/14/2023   HGB 11.0 (L) 05/14/2023   HCT 33.7 (L) 05/14/2023   PLT 221.0 05/14/2023   GLUCOSE 109 (H) 05/14/2023   CHOL 219 (H) 11/11/2018   TRIG 128.0 11/11/2018   HDL 50.30 11/11/2018   LDLDIRECT 196.7 12/22/2010   LDLCALC 143 (H) 11/11/2018   ALT 9 05/14/2023   AST 15 05/14/2023   NA 140 05/14/2023   K 3.8 05/14/2023   CL 104 05/14/2023   CREATININE 1.02 05/14/2023   BUN 17 05/14/2023   CO2 29 05/14/2023   TSH 2.15 02/11/2023   INR 0.96 03/24/2010   HGBA1C 5.7 04/26/2010    MM 3D SCREENING MAMMOGRAM BILATERAL BREAST Result Date: 02/25/2023 CLINICAL DATA:  Screening. EXAM: DIGITAL SCREENING BILATERAL MAMMOGRAM WITH TOMOSYNTHESIS AND CAD TECHNIQUE: Bilateral screening digital craniocaudal and mediolateral oblique mammograms were obtained. Bilateral screening digital breast tomosynthesis was performed. The images were evaluated with computer-aided detection. COMPARISON:  Previous exam(s). ACR Breast Density Category c: The breasts are heterogeneously dense, which may obscure small masses. FINDINGS: There are no findings suspicious for malignancy. IMPRESSION: No mammographic evidence of  malignancy. A result letter of this screening mammogram will be mailed directly to the patient. RECOMMENDATION: Screening mammogram in one year. (Code:SM-B-01Y) BI-RADS CATEGORY  1: Negative. Electronically Signed   By: Frederico Hamman M.D.   On: 02/25/2023 15:57    Assessment & Plan:   Problem List Items Addressed This Visit     B12 deficiency   On B complex      Relevant Orders   Comprehensive metabolic panel   CBC with  Differential/Platelet   TSH   Vitamin D deficiency   On Vit D      Osteoporosis - Primary   Re-start Prolia Vit D and calcium, Mg      Relevant Orders   Comprehensive metabolic panel   CBC with Differential/Platelet   TSH   Incontinence in female   Pt has Rx for supplies      Fecal urgency   Take Imodium AD (one tablet) before you go out to prevent loose stools      Muscle cramps   Relevant Orders   Comprehensive metabolic panel   CBC with Differential/Platelet   TSH      No orders of the defined types were placed in this encounter.     Follow-up: Return in about 3 months (around 12/11/2023) for a follow-up visit.  Sonda Primes, MD

## 2023-09-12 NOTE — Assessment & Plan Note (Signed)
On B complex 

## 2023-09-12 NOTE — Assessment & Plan Note (Signed)
On Vit D 

## 2023-09-12 NOTE — Assessment & Plan Note (Signed)
Take Imodium AD (one tablet) before you go out to prevent loose stools

## 2023-09-12 NOTE — Telephone Encounter (Signed)
Pt is needing an up;dated Prolia eval as she has not had one in a long time. Pt would like to start back having the injections.

## 2023-09-16 NOTE — Telephone Encounter (Signed)
Pt ready for scheduling for Prolia on or after : 09/16/23  Out-of-pocket cost due at time of visit: $0  Number of injection/visits approved: --  Primary: [employer] Medicare Prolia co-insurance: 100% Admin fee co-insurance: 100%  Secondary: N/A Prolia co-insurance:  Admin fee co-insurance:   Medical Benefit Details: Date Benefits were checked: 09/12/23 Deductible: no/ Coinsurance: no/ Admin Fee: no  Prior Auth: Not required PA# Expiration Date:   # of doses approved:  Pharmacy benefit: Copay $N/A If patient wants fill through the pharmacy benefit please send prescription to:  -- , and include estimated need by date in rx notes. Pharmacy will ship medication directly to the office.  Patient not eligible for Prolia Copay Card. Copay Card can make patient's cost as little as $25. Link to apply: [email or web address]://www.[employer]gensupportplus.com/copay  ** This summary of benefits is an estimation of the patient's out-of-pocket cost. Exact cost may very based on individual plan coverage.

## 2023-09-19 NOTE — Telephone Encounter (Signed)
1st attempt to reach pts daughter to have pt sched for her Prolia injection. LVM for pts daughter make a nurse visit with [place] for her mom.

## 2023-09-23 ENCOUNTER — Ambulatory Visit: Payer: Medicare Other

## 2023-09-23 ENCOUNTER — Other Ambulatory Visit: Payer: Self-pay

## 2023-09-23 DIAGNOSIS — M810 Age-related osteoporosis without current pathological fracture: Secondary | ICD-10-CM | POA: Diagnosis not present

## 2023-09-23 MED ORDER — DENOSUMAB 60 MG/ML ~~LOC~~ SOSY
60.0000 mg | PREFILLED_SYRINGE | Freq: Once | SUBCUTANEOUS | Status: AC
Start: 2023-10-07 — End: 2023-09-23
  Administered 2023-09-23: 60 mg via SUBCUTANEOUS

## 2023-09-23 MED ORDER — DENOSUMAB 60 MG/ML ~~LOC~~ SOSY
60.0000 mg | PREFILLED_SYRINGE | Freq: Once | SUBCUTANEOUS | Status: AC
Start: 2024-03-06 — End: ?

## 2023-09-23 NOTE — Progress Notes (Cosign Needed)
Patient seen in office today and Prolia administered in left arm.  Patient tolerated injection well and order placed for next injection for June.

## 2023-10-29 ENCOUNTER — Ambulatory Visit (INDEPENDENT_AMBULATORY_CARE_PROVIDER_SITE_OTHER): Payer: Medicare Other | Admitting: Podiatry

## 2023-10-29 ENCOUNTER — Encounter: Payer: Self-pay | Admitting: Podiatry

## 2023-10-29 VITALS — Ht <= 58 in | Wt 127.0 lb

## 2023-10-29 DIAGNOSIS — B351 Tinea unguium: Secondary | ICD-10-CM

## 2023-10-29 DIAGNOSIS — M79674 Pain in right toe(s): Secondary | ICD-10-CM | POA: Diagnosis not present

## 2023-10-29 DIAGNOSIS — M79675 Pain in left toe(s): Secondary | ICD-10-CM | POA: Diagnosis not present

## 2023-11-04 ENCOUNTER — Encounter: Payer: Self-pay | Admitting: Podiatry

## 2023-11-04 NOTE — Progress Notes (Signed)
  Subjective:  Patient ID: Joyce Riley, female    DOB: [birth year],  MRN: [id number]  Joyce Riley presents to clinic today for painful, elongated thickened toenails x 10 which are symptomatic when wearing enclosed shoe gear. This interferes with his/her daily activities. She is accompanied by Russian interpreter, Russ, on today's visit.  Chief Complaint  Patient presents with   RFC    She is here for a nail trim. PCP is Plotkilov is PCP   New problem(s): None.   PCP is Plotnikov, Karlynn GAILS, MD.  Allergies  Allergen Reactions   Aspirin     bruising   Atorvastatin     REACTION: myalgia   Codeine Other (See Comments)   Meclizine  Hcl     REACTION: rash   Milk-Related Compounds    Simvastatin     Dizzy, vomiting    Review of Systems: Negative except as noted in the HPI.  Objective: No changes noted in today's physical examination. There were no vitals filed for this visit. Joyce Riley is a pleasant 89 y.o. female WD, WN in NAD. AAO x 3.  Vascular Examination: Capillary refill time immediate b/l. Vascular status intact b/l with palpable pedal pulses. Pedal hair present b/l. No pain with calf compression b/l. Skin temperature gradient WNL b/l. No cyanosis or clubbing b/l. No ischemia or gangrene noted b/l.   Neurological Examination: Sensation grossly intact b/l with 10 gram monofilament. Vibratory sensation intact b/l.   Dermatological Examination: Pedal skin with normal turgor, texture and tone b/l.  No open wounds. No interdigital macerations.   Toenails 1-5 b/l thick, discolored, elongated with subungual debris and pain on dorsal palpation.   No corns, calluses nor porokeratotic lesions noted.  Musculoskeletal Examination: Muscle strength 5/5 to all lower extremity muscle groups bilaterally. HAV with bunion deformity noted b/l LE.  Radiographs: None  Assessment/Plan: 1. Pain due to onychomycosis of toenails of both feet     -Patient's primary language is  Russian. Patient seen with assistance of in-person interpreter on today's visit.  -Patient to continue soft, supportive shoe gear daily. -Toenails 1-5 b/l were debrided in length and girth with sterile nail nippers and dremel without iatrogenic bleeding.  -Patient/POA to call should there be question/concern in the interim.   Return in about 3 months (around 01/26/2024).  [name], DPM      [facility] LOCATION: [address], [place] [address]                   Office [phone]   [facility] LOCATION: [address] [place], [place] [address] Office [phone]

## 2023-11-12 ENCOUNTER — Telehealth: Payer: Self-pay

## 2023-11-12 NOTE — Telephone Encounter (Signed)
 Copied from CRM [id number]. Topic: General - Other >> Nov 12, 2023 11:55 AM Elizebeth Brooking wrote: Reason for CRM: Mia from [employer] called regarding the forms that were sent on 02/12 , is requesting that the physician get that sent back over and signed

## 2023-11-16 ENCOUNTER — Encounter: Payer: Self-pay | Admitting: Internal Medicine

## 2023-12-06 ENCOUNTER — Other Ambulatory Visit: Payer: Self-pay | Admitting: Internal Medicine

## 2023-12-11 ENCOUNTER — Ambulatory Visit (INDEPENDENT_AMBULATORY_CARE_PROVIDER_SITE_OTHER): Payer: Medicare Other | Admitting: Internal Medicine

## 2023-12-11 ENCOUNTER — Encounter: Payer: Self-pay | Admitting: Internal Medicine

## 2023-12-11 VITALS — HR 61 | Temp 98.4°F | Ht <= 58 in | Wt 127.0 lb

## 2023-12-11 DIAGNOSIS — G8929 Other chronic pain: Secondary | ICD-10-CM | POA: Diagnosis not present

## 2023-12-11 DIAGNOSIS — M7062 Trochanteric bursitis, left hip: Secondary | ICD-10-CM | POA: Diagnosis not present

## 2023-12-11 DIAGNOSIS — E559 Vitamin D deficiency, unspecified: Secondary | ICD-10-CM

## 2023-12-11 DIAGNOSIS — R197 Diarrhea, unspecified: Secondary | ICD-10-CM | POA: Diagnosis not present

## 2023-12-11 DIAGNOSIS — M545 Low back pain, unspecified: Secondary | ICD-10-CM | POA: Diagnosis not present

## 2023-12-11 DIAGNOSIS — M7061 Trochanteric bursitis, right hip: Secondary | ICD-10-CM | POA: Insufficient documentation

## 2023-12-11 DIAGNOSIS — E538 Deficiency of other specified B group vitamins: Secondary | ICD-10-CM | POA: Diagnosis not present

## 2023-12-11 MED ORDER — DICLOFENAC SODIUM 1 % EX GEL: 2.0000 g | Freq: Four times a day (QID) | CUTANEOUS | 1 refills | Status: AC

## 2023-12-11 NOTE — Assessment & Plan Note (Signed)
On B complex 

## 2023-12-11 NOTE — Assessment & Plan Note (Signed)
 Better

## 2023-12-11 NOTE — Assessment & Plan Note (Signed)
 Stable

## 2023-12-11 NOTE — Patient Instructions (Signed)
 Voltaren gel, heat and massage Tylenol 650 mg 3 times a day as needed

## 2023-12-11 NOTE — Assessment & Plan Note (Signed)
 On Vit D

## 2023-12-11 NOTE — Progress Notes (Signed)
 Subjective:  Patient ID: Joyce Riley, female    DOB: [birth year]  Age: 89 y.o. MRN: [id number]  CC: Medical Management of Chronic Issues ( Mnth f/u)   HPI Joyce Riley presents for GERD, hnausea, incontinence C/o pain in B legs at rest and w/activity  Outpatient Medications Prior to Visit  Medication Sig Dispense Refill   B-Complex CAPS Take by mouth.     calcium-vitamin D (OSCAL WITH D) 500-200 MG-UNIT tablet Take 1 tablet by mouth 2 (two) times daily. 100 tablet 3   chlorhexidine (PERIDEX) 0.12 % solution SMARTSIG:By Mouth     Cholecalciferol 1000 UNITS tablet Take 1 tablet (1,000 Units total) by mouth daily. 100 tablet 3   denosumab (PROLIA) 60 MG/ML SOSY injection Inject 60 mg into the skin every 6 (six) months.     Incontinence Supply Disposable (DEPEND FITTED BRIEFS SM/MED) MISC As directed 100 each 11   Incontinence Supply Disposable (UNDERPADS REGULAR) MISC As directed 40 each 5   loperamide (IMODIUM A-D) 2 MG tablet Take 1-2 tablets (2-4 mg total) by mouth 4 (four) times daily as needed for diarrhea or loose stools. 60 tablet 1   Magnesium 250 MG TABS Take 1 tablet (250 mg total) by mouth daily. 100 tablet 3   meclizine (ANTIVERT) 12.5 MG tablet Take 12.5-25 mg by mouth 3 (three) times daily as needed.     omeprazole (PRILOSEC) 40 MG capsule TAKE 1 CAPSULE (40 MG TOTAL) BY MOUTH DAILY. 90 capsule 3   ondansetron (ZOFRAN) 4 MG tablet Take 1 tablet (4 mg total) by mouth every 8 (eight) hours as needed for nausea or vomiting. 20 tablet 1   triamcinolone ointment (KENALOG) 0.5 % Apply 1 application topically 2 (two) times daily. 90 g 3   diclofenac Sodium (VOLTAREN) 1 % GEL Apply 2 g topically 4 (four) times daily. To the L hip are 300 g 1   Facility-Administered Medications Prior to Visit  Medication Dose Route Frequency Provider Last Rate Last Admin   [START ON 03/06/2024] denosumab (PROLIA) injection 60 mg  60 mg Subcutaneous Once Wiliam Cauthorn V, MD         ROS: Review of Systems  Constitutional:  Negative for activity change, appetite change, chills, fatigue and unexpected weight change.  HENT:  Negative for congestion, mouth sores and sinus pressure.   Eyes:  Negative for visual disturbance.  Respiratory:  Negative for cough and chest tightness.   Gastrointestinal:  Positive for nausea. Negative for abdominal pain.  Genitourinary:  Negative for difficulty urinating, frequency and vaginal pain.  Musculoskeletal:  Positive for arthralgias and gait problem. Negative for back pain.  Skin:  Negative for pallor and rash.  Neurological:  Negative for dizziness, tremors, weakness, numbness and headaches.  Psychiatric/Behavioral:  Negative for confusion and sleep disturbance. The patient is not nervous/anxious.     Objective:  Pulse 61   Temp 98.4 F (36.9 C) (Oral)   Ht 4' 6.5" (1.384 m)   Wt 127 lb (57.6 kg)   SpO2 92%   BMI 30.06 kg/m   BP Readings from Last 3 Encounters:  09/12/23 120/70  05/14/23 118/72  02/19/23 130/70    Wt Readings from Last 3 Encounters:  12/11/23 127 lb (57.6 kg)  10/29/23 127 lb (57.6 kg)  09/12/23 127 lb (57.6 kg)    Physical Exam Constitutional:      General: She is not in acute distress.    Appearance: Normal appearance. She is well-developed.  HENT:     Head:  Normocephalic.     Right Ear: External ear normal.     Left Ear: External ear normal.     Nose: Nose normal.  Eyes:     General:        Right eye: No discharge.        Left eye: No discharge.     Conjunctiva/sclera: Conjunctivae normal.     Pupils: Pupils are equal, round, and reactive to light.  Neck:     Thyroid: No thyromegaly.     Vascular: No JVD.     Trachea: No tracheal deviation.  Cardiovascular:     Rate and Rhythm: Normal rate and regular rhythm.     Heart sounds: Normal heart sounds.  Pulmonary:     Effort: No respiratory distress.     Breath sounds: No stridor. No wheezing.  Abdominal:     General: Bowel sounds  are normal. There is no distension.     Palpations: Abdomen is soft. There is no mass.     Tenderness: There is no abdominal tenderness. There is no guarding or rebound.  Musculoskeletal:        General: No tenderness.     Cervical back: Normal range of motion and neck supple. No rigidity.  Lymphadenopathy:     Cervical: No cervical adenopathy.  Skin:    Findings: No erythema or rash.  Neurological:     Mental Status: She is oriented to person, place, and time.     Cranial Nerves: No cranial nerve deficit.     Motor: No abnormal muscle tone.     Coordination: Coordination normal.     Gait: Gait abnormal.     Deep Tendon Reflexes: Reflexes normal.  Psychiatric:        Behavior: Behavior normal.        Thought Content: Thought content normal.        Judgment: Judgment normal.    B troch bursas - painful Str leg elevation (-) B Hips w/good ROM Pulses - ok   Lab Results  Component Value Date   WBC 5.7 09/12/2023   HGB 11.3 (L) 09/12/2023   HCT 33.6 (L) 09/12/2023   PLT 252.0 09/12/2023   GLUCOSE 91 09/12/2023   CHOL 219 (H) 11/11/2018   TRIG 128.0 11/11/2018   HDL 50.30 11/11/2018   LDLDIRECT 196.7 12/22/2010   LDLCALC 143 (H) 11/11/2018   ALT 10 09/12/2023   AST 16 09/12/2023   NA 138 09/12/2023   K 4.0 09/12/2023   CL 102 09/12/2023   CREATININE 1.05 09/12/2023   BUN 14 09/12/2023   CO2 27 09/12/2023   TSH 1.80 09/12/2023   INR 0.96 03/24/2010   HGBA1C 5.7 04/26/2010    MM 3D SCREENING MAMMOGRAM BILATERAL BREAST Result Date: 02/25/2023 CLINICAL DATA:  Screening. EXAM: DIGITAL SCREENING BILATERAL MAMMOGRAM WITH TOMOSYNTHESIS AND CAD TECHNIQUE: Bilateral screening digital craniocaudal and mediolateral oblique mammograms were obtained. Bilateral screening digital breast tomosynthesis was performed. The images were evaluated with computer-aided detection. COMPARISON:  Previous exam(s). ACR Breast Density Category c: The breasts are heterogeneously dense, which may  obscure small masses. FINDINGS: There are no findings suspicious for malignancy. IMPRESSION: No mammographic evidence of malignancy. A result letter of this screening mammogram will be mailed directly to the patient. RECOMMENDATION: Screening mammogram in one year. (Code:SM-B-01Y) BI-RADS CATEGORY  1: Negative. Electronically Signed   By: Frederico Hamman M.D.   On: 02/25/2023 15:57    Assessment & Plan:   Problem List Items Addressed This Visit  B12 deficiency   On B complex      Vitamin D deficiency   On Vit D      LOW BACK PAIN   Better      Diarrhea   Stable       Trochanteric bursitis of both hips - Primary   New Voltaren gel, heat and massage Tylenol 650 mg tid prn We can inject w/steroids         Meds ordered this encounter  Medications   diclofenac Sodium (VOLTAREN) 1 % GEL    Sig: Apply 2 g topically 4 (four) times daily. To the L hip are    Dispense:  300 g    Refill:  1      Follow-up: Return in about 3 months (around 03/12/2024) for a follow-up visit.  Sonda Primes, MD

## 2023-12-11 NOTE — Assessment & Plan Note (Addendum)
 New Voltaren gel, heat and massage Tylenol 650 mg tid prn We can inject w/steroids

## 2024-01-22 DIAGNOSIS — H04123 Dry eye syndrome of bilateral lacrimal glands: Secondary | ICD-10-CM | POA: Diagnosis not present

## 2024-01-22 DIAGNOSIS — H40023 Open angle with borderline findings, high risk, bilateral: Secondary | ICD-10-CM | POA: Diagnosis not present

## 2024-01-22 DIAGNOSIS — H02203 Unspecified lagophthalmos right eye, unspecified eyelid: Secondary | ICD-10-CM | POA: Diagnosis not present

## 2024-01-27 ENCOUNTER — Encounter: Payer: Self-pay | Admitting: Internal Medicine

## 2024-01-29 ENCOUNTER — Other Ambulatory Visit: Payer: Self-pay | Admitting: Obstetrics and Gynecology

## 2024-01-29 DIAGNOSIS — Z1231 Encounter for screening mammogram for malignant neoplasm of breast: Secondary | ICD-10-CM

## 2024-02-20 ENCOUNTER — Ambulatory Visit: Payer: Medicare Other

## 2024-02-20 VITALS — BP 140/90 | HR 67 | Wt 125.6 lb

## 2024-02-20 DIAGNOSIS — Z0000 Encounter for general adult medical examination without abnormal findings: Secondary | ICD-10-CM
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
## 2024-02-20 NOTE — Patient Instructions (Signed)
 [name] , Thank you for taking time out of your busy schedule to complete your Annual Wellness Visit with me. I enjoyed our conversation and look forward to speaking with you again next year. I, as well as your care team,  appreciate your ongoing commitment to your health goals. Please review the following plan we discussed and let me know if I can assist you in the future. Your Game plan/ To Do List   Follow up Visits: Next Medicare AWV with our clinical staff: 02/23/2025.   Have you seen your provider in the last 6 months (3 months if uncontrolled diabetes)? Yes Next Office Visit with your provider: 03/16/2024.  Clinician Recommendations:  Aim for 30 minutes of exercise or brisk walking, 6-8 glasses of water, and 5 servings of fruits and vegetables each day. You are due for a Shingles vaccine and a tetanus vaccine and can get that done at the pharmacy.        This is a list of the screening recommended for you and due dates:  Health Maintenance  Topic Date Due   DTaP/Tdap/Td vaccine (1 - Tdap) Never done   Zoster (Shingles) Vaccine (2 of 2) 08/17/2017   COVID-19 Vaccine (6 - 2024-25 season) 05/26/2023   Mammogram  02/28/2024   Flu Shot  04/24/2024   Medicare Annual Wellness Visit  02/19/2025   Pneumonia Vaccine  Completed   DEXA scan (bone density measurement)  Completed   HPV Vaccine  Aged Out   Meningitis B Vaccine  Aged Out    Advanced directives: (Declined) Advance directive discussed with you today. Even though you declined this today, please call our office should you change your mind, and we can give you the proper paperwork for you to fill out. Advance Care Planning is important because it:  [x]  Makes sure you receive the medical care that is consistent with your values, goals, and preferences  [x]  It provides guidance to your family and loved ones and reduces their decisional burden about whether or not they are making the right decisions based on your wishes.  Follow  the link provided in your after visit summary or read over the paperwork we have mailed to you to help you started getting your Advance Directives in place. If you need assistance in completing these, please reach out to us  so that we can help you!  See attachments for Preventive Care and Fall Prevention Tips.

## 2024-02-25 ENCOUNTER — Encounter

## 2024-02-25 DIAGNOSIS — Z1231 Encounter for screening mammogram for malignant neoplasm of breast: Secondary | ICD-10-CM

## 2024-02-26 ENCOUNTER — Other Ambulatory Visit (HOSPITAL_COMMUNITY): Payer: Self-pay

## 2024-02-26 ENCOUNTER — Telehealth: Payer: Self-pay

## 2024-02-26 NOTE — Telephone Encounter (Signed)
 Joyce Riley

## 2024-02-26 NOTE — Telephone Encounter (Signed)
 Prolia  VOB initiated via [email or web address]  Next Prolia  inj DUE: 03/23/24

## 2024-02-26 NOTE — Telephone Encounter (Signed)
 Pt ready for scheduling for PROLIA  on or after : 03/23/24  Option# 1: Buy/Bill (Office supplied medication)  Out-of-pocket cost due at time of clinic visit: $0  Number of injection/visits approved: ---  Primary: MEDICARE Prolia  co-insurance: 0% Admin fee co-insurance: 0%  Secondary: [place] MEDICAID Prolia  co-insurance:  Admin fee co-insurance:   Medical Benefit Details: Date Benefits were checked: 02/26/24 Deductible: NO/ Coinsurance: 0%/ Admin Fee: 0%  Prior Auth: N/A PA# Expiration Date:   # of doses approved: ----------------------------------------------------------------------- Option# 2- Med Obtained from pharmacy:  Pharmacy benefit: Copay $4.80 (Paid to pharmacy) Admin Fee: 0% (Pay at clinic)  Prior Auth: N/A PA# Expiration Date:   # of doses approved:   If patient wants fill through the pharmacy benefit please send prescription to: [facility], and include estimated need by date in rx notes. Pharmacy will ship medication directly to the office.  Patient NOT eligible for Prolia  Copay Card. Copay Card can make patient's cost as little as $25. Link to apply: [email or web address]://www.[email or web address].[email or web address]  ** This summary of benefits is an estimation of the patient's out-of-pocket cost. Exact cost may very based on individual plan coverage.

## 2024-03-04 ENCOUNTER — Encounter: Payer: Self-pay | Admitting: Podiatry

## 2024-03-04 ENCOUNTER — Ambulatory Visit (INDEPENDENT_AMBULATORY_CARE_PROVIDER_SITE_OTHER): Payer: Medicare Other | Admitting: Podiatry

## 2024-03-04 DIAGNOSIS — M79674 Pain in right toe(s): Secondary | ICD-10-CM

## 2024-03-04 DIAGNOSIS — M79675 Pain in left toe(s): Secondary | ICD-10-CM | POA: Diagnosis not present

## 2024-03-04 DIAGNOSIS — B351 Tinea unguium: Secondary | ICD-10-CM | POA: Diagnosis not present

## 2024-03-05 ENCOUNTER — Other Ambulatory Visit: Payer: Self-pay | Admitting: Obstetrics and Gynecology

## 2024-03-05 ENCOUNTER — Telehealth: Payer: Self-pay | Admitting: Obstetrics and Gynecology

## 2024-03-05 ENCOUNTER — Encounter: Payer: Self-pay | Admitting: Obstetrics and Gynecology

## 2024-03-05 ENCOUNTER — Ambulatory Visit: Admitting: Obstetrics and Gynecology

## 2024-03-05 VITALS — BP 134/84 | HR 67

## 2024-03-05 DIAGNOSIS — L539 Erythematous condition, unspecified: Secondary | ICD-10-CM

## 2024-03-05 DIAGNOSIS — N61 Mastitis without abscess: Secondary | ICD-10-CM

## 2024-03-05 NOTE — Telephone Encounter (Signed)
 [facility] contacted @ [phone].  Patient was scheduled for a diagnostic bilateral mammogram and right breast ultrasound 04/06/24 @ 11:00.  Diagnosis:  erythema and swelling of right lateral breast.  DX: N61.1 Patient requested an appointment 11:00-12:00.  Patient was informed (using interpreter) this was the first available with her preferred time.  Patient agreed to appointment date and time.  Patient was given appointment date, time, phone number and address.

## 2024-03-05 NOTE — Telephone Encounter (Signed)
 Patient needs dx bilateral mammogram and right breast US  at [facility].   She has an area of erythema and swelling of right lateral breast.  No specific mass.
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
## 2024-03-05 NOTE — Telephone Encounter (Signed)
Please place in mammogram hold. 

## 2024-03-06 ENCOUNTER — Ambulatory Visit

## 2024-03-06 NOTE — Telephone Encounter (Signed)
Placed in [place] hold.  Encounter closed.

## 2024-03-10 ENCOUNTER — Encounter: Payer: Self-pay | Admitting: Podiatry

## 2024-03-10 NOTE — Progress Notes (Signed)
  Subjective:  Patient ID: Joyce Riley, female    DOB: October 19, 1933,  MRN: [id number]  Joyce Riley presents to clinic today for painful elongated mycotic toenails 1-5 bilaterally which are tender when wearing enclosed shoe gear. Pain is relieved with periodic professional debridement. She is accompanied by [place] interpreter, Joyce Riley, on today's visit.  Chief Complaint  Patient presents with   RFC    Rm16 not diabetic/last pcp visit March 2025   New problem(s): None.   PCP is Plotnikov, Oakley Bellman, MD.  Allergies  Allergen Reactions   Aspirin     bruising   Atorvastatin     REACTION: myalgia   Codeine Other (See Comments)   Meclizine  Hcl     REACTION: rash   Milk-Related Compounds    Simvastatin     Dizzy, vomiting    Review of Systems: Negative except as noted in the HPI.  Objective: No changes noted in today's physical examination. There were no vitals filed for this visit. Joyce Riley is a pleasant 89 y.o. female WD, WN in NAD. AAO x 3.  Vascular Examination: Capillary refill time immediate b/l. Palpable pedal pulses. Pedal hair present b/l. No pain with calf compression b/l. Skin temperature gradient WNL b/l. No cyanosis or clubbing b/l. No ischemia or gangrene noted b/l.   Neurological Examination: Sensation grossly intact b/l with 10 gram monofilament. Vibratory sensation intact b/l.   Dermatological Examination: Pedal skin with normal turgor, texture and tone b/l.  No open wounds. No interdigital macerations.   Toenails 1-5 b/l thick, discolored, elongated with subungual debris and pain on dorsal palpation.   No hyperkeratotic nor porokeratotic lesions present on today's visit.  Musculoskeletal Examination: Muscle strength 5/5 to all lower extremity muscle groups bilaterally. HAV with bunion deformity noted b/l LE.  Radiographs: None  Assessment/Plan: 1. Pain due to onychomycosis of toenails of both feet     -Patient's primary language is [place].  Patient seen with assistance of in-person interpreter on today's visit.  -Consent given for treatment as described below: -Examined patient. -Continue supportive shoe gear daily. -Mycotic toenails 1-5 bilaterally were debrided in length and girth with sterile nail nippers and dremel without incident. -Patient/POA to call should there be question/concern in the interim.   Return in about 3 months (around 06/04/2024).  Joyce Riley, DPM       LOCATION: [address] [place] [address]                   Office [phone]   [facility] LOCATION: [address] [place], [place] [address] Office [phone]

## 2024-03-16 ENCOUNTER — Encounter: Payer: Self-pay | Admitting: Internal Medicine

## 2024-03-16 ENCOUNTER — Ambulatory Visit (INDEPENDENT_AMBULATORY_CARE_PROVIDER_SITE_OTHER): Admitting: Internal Medicine

## 2024-03-16 VITALS — BP 150/78 | HR 70 | Temp 98.6°F | Ht <= 58 in | Wt 123.2 lb

## 2024-03-16 DIAGNOSIS — E559 Vitamin D deficiency, unspecified: Secondary | ICD-10-CM | POA: Diagnosis not present

## 2024-03-16 DIAGNOSIS — N1831 Chronic kidney disease, stage 3a: Secondary | ICD-10-CM | POA: Diagnosis not present

## 2024-03-16 DIAGNOSIS — K219 Gastro-esophageal reflux disease without esophagitis: Secondary | ICD-10-CM

## 2024-03-16 DIAGNOSIS — E538 Deficiency of other specified B group vitamins: Secondary | ICD-10-CM | POA: Diagnosis not present

## 2024-03-16 DIAGNOSIS — R413 Other amnesia: Secondary | ICD-10-CM

## 2024-03-16 DIAGNOSIS — R634 Abnormal weight loss: Secondary | ICD-10-CM | POA: Diagnosis not present

## 2024-03-16 DIAGNOSIS — M810 Age-related osteoporosis without current pathological fracture: Secondary | ICD-10-CM | POA: Diagnosis not present

## 2024-03-16 LAB — TSH: TSH: 1.93 u[IU]/mL (ref 0.35–5.50)

## 2024-03-16 NOTE — Assessment & Plan Note (Signed)
Hydrate well daily. Monitor GFR 

## 2024-03-16 NOTE — Assessment & Plan Note (Signed)
 On Vit D

## 2024-03-16 NOTE — Assessment & Plan Note (Signed)
On Lion's mane. On B complex ?Doing well. Stable mild cognitive deficit. ?

## 2024-03-16 NOTE — Assessment & Plan Note (Signed)
On B complex 

## 2024-03-16 NOTE — Assessment & Plan Note (Signed)
 Sch prolia  inj - end of June

## 2024-03-16 NOTE — Assessment & Plan Note (Addendum)
 On Dexilant  did the best - no pain or cough - (not covered) On Omeprazole  40 mg/d - try bid due to cough - pt declined

## 2024-03-16 NOTE — Assessment & Plan Note (Signed)
  Wt Readings from Last 3 Encounters:  03/16/24 123 lb 3.2 oz (55.9 kg)  02/20/24 125 lb 9.6 oz (57 kg)  12/11/23 127 lb (57.6 kg)

## 2024-03-16 NOTE — Progress Notes (Signed)
 Subjective:  Patient ID: Joyce Riley, female    DOB: 1934-04-07  Age: 89 y.o. MRN: [id number]  CC: No chief complaint on file.   HPI Joyce Riley presents for GERD, diarrhea, OA  Outpatient Medications Prior to Visit  Medication Sig Dispense Refill   B-Complex CAPS Take by mouth.     calcium -vitamin D  (OSCAL WITH D) 500-200 MG-UNIT tablet Take 1 tablet by mouth 2 (two) times daily. 100 tablet 3   chlorhexidine (PERIDEX) 0.12 % solution SMARTSIG:By Mouth     Cholecalciferol  1000 UNITS tablet Take 1 tablet (1,000 Units total) by mouth daily. 100 tablet 3   denosumab  (PROLIA ) 60 MG/ML SOSY injection Inject 60 mg into the skin every 6 (six) months.     diclofenac  Sodium (VOLTAREN ) 1 % GEL Apply 2 g topically 4 (four) times daily. To the L hip are 300 g 1   Incontinence Supply Disposable (DEPEND FITTED BRIEFS SM/MED) MISC As directed 100 each 11   Incontinence Supply Disposable (UNDERPADS REGULAR) MISC As directed 40 each 5   loperamide  (IMODIUM  A-D) 2 MG tablet Take 1-2 tablets (2-4 mg total) by mouth 4 (four) times daily as needed for diarrhea or loose stools. 60 tablet 1   Magnesium  250 MG TABS Take 1 tablet (250 mg total) by mouth daily. 100 tablet 3   meclizine  (ANTIVERT ) 12.5 MG tablet Take 12.5-25 mg by mouth 3 (three) times daily as needed.     omeprazole  (PRILOSEC) 40 MG capsule TAKE 1 CAPSULE (40 MG TOTAL) BY MOUTH DAILY. 90 capsule 3   ondansetron  (ZOFRAN ) 4 MG tablet Take 1 tablet (4 mg total) by mouth every 8 (eight) hours as needed for nausea or vomiting. 20 tablet 1   triamcinolone  ointment (KENALOG ) 0.5 % Apply 1 application topically 2 (two) times daily. 90 g 3   Facility-Administered Medications Prior to Visit  Medication Dose Route Frequency Provider Last Rate Last Admin   denosumab  (PROLIA ) injection 60 mg  60 mg Subcutaneous Once Kharson Rasmusson V, MD        ROS: Review of Systems  Constitutional:  Negative for activity change, appetite change, chills,  fatigue and unexpected weight change.  HENT:  Negative for congestion, mouth sores and sinus pressure.   Eyes:  Negative for visual disturbance.  Respiratory:  Negative for cough and chest tightness.   Gastrointestinal:  Positive for diarrhea. Negative for abdominal pain, nausea and vomiting.  Genitourinary:  Negative for difficulty urinating, frequency and vaginal pain.  Musculoskeletal:  Positive for arthralgias and gait problem. Negative for back pain.  Skin:  Negative for color change, pallor and rash.  Neurological:  Negative for dizziness, tremors, weakness, numbness and headaches.  Psychiatric/Behavioral:  Negative for confusion, sleep disturbance and suicidal ideas.     Objective:  BP (!) 150/78 (BP Location: Left Arm, Patient Position: Sitting)   Pulse 70   Temp 98.6 F (37 C) (Temporal)   Ht 4' 6.5 (1.384 m)   Wt 123 lb 3.2 oz (55.9 kg)   SpO2 94%   BMI 29.16 kg/m   BP Readings from Last 3 Encounters:  03/16/24 (!) 150/78  03/05/24 134/84  02/20/24 (!) 140/90    Wt Readings from Last 3 Encounters:  03/16/24 123 lb 3.2 oz (55.9 kg)  02/20/24 125 lb 9.6 oz (57 kg)  12/11/23 127 lb (57.6 kg)    Physical Exam Constitutional:      General: She is not in acute distress.    Appearance: Normal appearance. She is well-developed.  HENT:     Head: Normocephalic.     Right Ear: External ear normal.     Left Ear: External ear normal.     Nose: Nose normal.   Eyes:     General:        Right eye: No discharge.        Left eye: No discharge.     Conjunctiva/sclera: Conjunctivae normal.     Pupils: Pupils are equal, round, and reactive to light.   Neck:     Thyroid : No thyromegaly.     Vascular: No JVD.     Trachea: No tracheal deviation.   Cardiovascular:     Rate and Rhythm: Normal rate and regular rhythm.     Heart sounds: Normal heart sounds.  Pulmonary:     Effort: No respiratory distress.     Breath sounds: No stridor. No wheezing.  Abdominal:      General: Bowel sounds are normal. There is no distension.     Palpations: Abdomen is soft. There is no mass.     Tenderness: There is no abdominal tenderness. There is no guarding or rebound.   Musculoskeletal:        General: No tenderness.     Cervical back: Normal range of motion and neck supple. No rigidity.     Right lower leg: No edema.     Left lower leg: No edema.  Lymphadenopathy:     Cervical: No cervical adenopathy.   Skin:    Findings: No erythema or rash.   Neurological:     Cranial Nerves: No cranial nerve deficit.     Motor: No abnormal muscle tone.     Coordination: Coordination normal.     Gait: Gait normal.     Deep Tendon Reflexes: Reflexes normal.   Psychiatric:        Behavior: Behavior normal.        Thought Content: Thought content normal.        Judgment: Judgment normal.     Lab Results  Component Value Date   WBC 5.7 09/12/2023   HGB 11.3 (L) 09/12/2023   HCT 33.6 (L) 09/12/2023   PLT 252.0 09/12/2023   GLUCOSE 91 09/12/2023   CHOL 219 (H) 11/11/2018   TRIG 128.0 11/11/2018   HDL 50.30 11/11/2018   LDLDIRECT 196.7 12/22/2010   LDLCALC 143 (H) 11/11/2018   ALT 10 09/12/2023   AST 16 09/12/2023   NA 138 09/12/2023   K 4.0 09/12/2023   CL 102 09/12/2023   CREATININE 1.05 09/12/2023   BUN 14 09/12/2023   CO2 27 09/12/2023   TSH 1.80 09/12/2023   INR 0.96 03/24/2010   HGBA1C 5.7 04/26/2010    MM 3D SCREENING MAMMOGRAM BILATERAL BREAST Result Date: 02/25/2023 CLINICAL DATA:  Screening. EXAM: DIGITAL SCREENING BILATERAL MAMMOGRAM WITH TOMOSYNTHESIS AND CAD TECHNIQUE: Bilateral screening digital craniocaudal and mediolateral oblique mammograms were obtained. Bilateral screening digital breast tomosynthesis was performed. The images were evaluated with computer-aided detection. COMPARISON:  Previous exam(s). ACR Breast Density Category c: The breasts are heterogeneously dense, which may obscure small masses. FINDINGS: There are no findings  suspicious for malignancy. IMPRESSION: No mammographic evidence of malignancy. A result letter of this screening mammogram will be mailed directly to the patient. RECOMMENDATION: Screening mammogram in one year. (Code:SM-B-01Y) BI-RADS CATEGORY  1: Negative. Electronically Signed   By: Rosaline Collet M.D.   On: 02/25/2023 15:57    Assessment & Plan:   Problem List Items Addressed This Visit  B12 deficiency - Primary   On B complex      Vitamin D  deficiency   On Vit D      GERD   On Dexilant  did the best - no pain or cough - (not covered) On Omeprazole  40 mg/d - try bid due to cough - pt declined      Osteoporosis   Sch prolia  inj - end of June      CKD (chronic kidney disease) stage 3, GFR 30-59 ml/min (HCC)   Hydrate well daily. Monitor GFR      Weight loss    Wt Readings from Last 3 Encounters:  03/16/24 123 lb 3.2 oz (55.9 kg)  02/20/24 125 lb 9.6 oz (57 kg)  12/11/23 127 lb (57.6 kg)         Memory loss   On Lion's mane. On B complex Doing well. Stable mild cognitive deficit.         No orders of the defined types were placed in this encounter.     Follow-up: Return in about 3 months (around 06/16/2024) for a follow-up visit.  Marolyn Noel, MD

## 2024-03-17 LAB — COMPREHENSIVE METABOLIC PANEL WITH GFR
ALT: 10 U/L (ref 0–35)
AST: 14 U/L (ref 0–37)
Albumin: 4.1 g/dL (ref 3.5–5.2)
Alkaline Phosphatase: 43 U/L (ref 39–117)
BUN: 8 mg/dL (ref 6–23)
CO2: 24 meq/L (ref 19–32)
Calcium: 8.9 mg/dL (ref 8.4–10.5)
Chloride: 97 meq/L (ref 96–112)
Creatinine, Ser: 0.93 mg/dL (ref 0.40–1.20)
GFR: 55.12 mL/min — ABNORMAL LOW (ref >60.00)
Glucose, Bld: 82 mg/dL (ref 70–99)
Potassium: 4 meq/L (ref 3.5–5.1)
Sodium: 133 meq/L — ABNORMAL LOW (ref 135–145)
Total Bilirubin: 0.6 mg/dL (ref 0.2–1.2)
Total Protein: 7 g/dL (ref 6.0–8.3)

## 2024-03-18 ENCOUNTER — Ambulatory Visit: Payer: Self-pay | Admitting: Internal Medicine

## 2024-04-03 ENCOUNTER — Other Ambulatory Visit

## 2024-04-06 ENCOUNTER — Ambulatory Visit: Payer: Self-pay | Admitting: Obstetrics and Gynecology

## 2024-04-06 ENCOUNTER — Ambulatory Visit
Admission: RE | Admit: 2024-04-06 | Discharge: 2024-04-06 | Disposition: A | Discharge status: Home / Self Care | Source: Ambulatory Visit | Attending: Obstetrics and Gynecology | Admitting: Obstetrics and Gynecology

## 2024-04-06 ENCOUNTER — Ambulatory Visit

## 2024-04-06 DIAGNOSIS — N61 Mastitis without abscess: Secondary | ICD-10-CM

## 2024-04-06 DIAGNOSIS — S20161D Insect bite (nonvenomous) of breast, right breast, subsequent encounter: Secondary | ICD-10-CM | POA: Diagnosis not present

## 2024-04-06 DIAGNOSIS — L539 Erythematous condition, unspecified: Secondary | ICD-10-CM

## 2024-04-16 ENCOUNTER — Telehealth: Payer: Self-pay

## 2024-04-16 NOTE — Telephone Encounter (Signed)
 Copied from CRM #[id number]. Topic: Appointments - Scheduling Inquiry for Clinic >> Apr 16, 2024 11:27 AM Deleta RAMAN wrote: Reason for CRM: Patient daughter is calling to schedule for prolia  shot she would like an appointment anytime after 10:30. States she has open availability within the next two weeks  Please follow up with patient daughter at [phone]

## 2024-04-20 NOTE — Telephone Encounter (Signed)
 I was able to speak with the pts daughter and inform her that we were able to have her mom Sched for her Prolia  Injection on 04/24/2024 @ 10:40 am.

## 2024-04-24 ENCOUNTER — Ambulatory Visit

## 2024-04-24 DIAGNOSIS — M810 Age-related osteoporosis without current pathological fracture: Secondary | ICD-10-CM

## 2024-04-24 MED ORDER — DENOSUMAB 60 MG/ML ~~LOC~~ SOSY
60.0000 mg | PREFILLED_SYRINGE | Freq: Once | SUBCUTANEOUS | Status: AC
Start: 2024-10-26 — End: ?

## 2024-04-24 MED ORDER — DENOSUMAB 60 MG/ML ~~LOC~~ SOSY
60.0000 mg | PREFILLED_SYRINGE | Freq: Once | SUBCUTANEOUS | Status: AC
  Administered 2024-04-24: 60 mg via SUBCUTANEOUS

## 2024-04-24 NOTE — Progress Notes (Cosign Needed Addendum)
 Patient here Prolia  injection. Patient tolerated well no complications.patient has no further questions.   Medical screening examination/treatment/procedure(s) were performed by non-physician practitioner and as supervising physician I was immediately available for consultation/collaboration.  I agree with above. Karlynn Noel, MD

## 2024-06-03 ENCOUNTER — Encounter: Payer: Self-pay | Admitting: Internal Medicine

## 2024-06-03 ENCOUNTER — Telehealth: Payer: Self-pay

## 2024-06-03 NOTE — Telephone Encounter (Signed)
 Forma from [facility] have been received on 05/29/2024 and fax back successfully on 06/03/2024

## 2024-06-10 ENCOUNTER — Ambulatory Visit: Admitting: Obstetrics and Gynecology

## 2024-06-10 ENCOUNTER — Encounter: Payer: Self-pay | Admitting: Obstetrics and Gynecology

## 2024-06-10 ENCOUNTER — Other Ambulatory Visit (HOSPITAL_COMMUNITY)
Admission: RE | Admit: 2024-06-10 | Discharge: 2024-06-10 | Disposition: A | Discharge status: Home / Self Care | Source: Ambulatory Visit | Attending: Obstetrics and Gynecology | Admitting: Obstetrics and Gynecology

## 2024-06-10 VITALS — BP 126/82 | HR 71 | Ht <= 58 in | Wt 126.0 lb

## 2024-06-10 DIAGNOSIS — R159 Full incontinence of feces: Secondary | ICD-10-CM

## 2024-06-10 DIAGNOSIS — Z124 Encounter for screening for malignant neoplasm of cervix: Secondary | ICD-10-CM

## 2024-06-10 DIAGNOSIS — Z01419 Encounter for gynecological examination (general) (routine) without abnormal findings: Secondary | ICD-10-CM | POA: Diagnosis not present

## 2024-06-10 DIAGNOSIS — N816 Rectocele: Secondary | ICD-10-CM | POA: Diagnosis not present

## 2024-06-10 DIAGNOSIS — R350 Frequency of micturition: Secondary | ICD-10-CM | POA: Diagnosis not present

## 2024-06-10 LAB — URINALYSIS, COMPLETE W/RFL CULTURE
Bacteria, UA: NONE SEEN /HPF
Bilirubin Urine: NEGATIVE
Glucose, UA: NEGATIVE
Hyaline Cast: NONE SEEN /LPF
Ketones, ur: NEGATIVE
Leukocyte Esterase: NEGATIVE
Nitrites, Initial: NEGATIVE
Protein, ur: NEGATIVE
RBC / HPF: NONE SEEN /HPF (ref 0–2)
Specific Gravity, Urine: 1.015 (ref 1.001–1.035)
WBC, UA: NONE SEEN /HPF (ref 0–5)
pH: 8.5 — ABNORMAL HIGH (ref 5.0–8.0)

## 2024-06-10 LAB — NO CULTURE INDICATED

## 2024-06-10 NOTE — Patient Instructions (Addendum)
 Psyllium Capsules - Metamucil What is this medication? PSYLLIUM (SIL i yum) may support digestion and heart health. It works by increasing the bulk of your stool. This increases pressure, which helps the muscles in your intestine move stool. It also reduces the amount of cholesterol your body absorbs from food. This medicine may be used for other purposes; ask your health care provider or pharmacist if you have questions. COMMON BRAND NAME(S): GenFiber, Konsyl, Metamucil, Metamucil MultiHealth, Natural Fiber Laxative, Reguloid What should I tell my care team before I take this medication? They need to know if you have any of these conditions: Nausea or vomiting Stomach pain Sudden change in bowel habits that lasts more than 2 weeks Trouble swallowing An unusual or allergic reaction to psyllium, other medications, foods, dyes, or preservatives Pregnant or trying to get pregnant Breastfeeding How should I use this medication? Take this medication by mouth with a full glass of water. Follow the directions on the package labeling, or take as directed by your care team. Take your medication at regular intervals. Do not take your medication more often than directed. Talk to your care team about the use of this medication in children. While this medication may be prescribed for children as young as 68 years of age for selected conditions, precautions do apply. Overdosage: If you think you have taken too much of this medicine contact a poison control center or emergency room at once. NOTE: This medicine is only for you. Do not share this medicine with others. What if I miss a dose? If you miss a dose, take it as soon as you can. If it is almost time for your next dose, take only that dose. Do not take double or extra doses. What may interact with this medication? Interactions are not expected. Take this product at least 2 hours before or after other medications. This list may not describe all possible  interactions. Give your health care provider a list of all the medicines, herbs, non-prescription drugs, or dietary supplements you use. Also tell them if you smoke, drink alcohol, or use illegal drugs. Some items may interact with your medicine. What should I watch for while using this medication? Check with your care team if your symptoms do not start to get better or if they get worse. Stop using this medication and contact your care team if you have rectal bleeding or if you have to treat your constipation for more than 1 week. These could be signs of a more serious condition. Drink several glasses of water a day while you are taking this medication. This will help to relieve constipation and prevent dehydration. What side effects may I notice from receiving this medication? Side effects that you should report to your care team as soon as possible: Allergic reactions--skin rash, itching, hives, swelling of the face, lips, tongue, or throat Choking--chest pain, trouble swallowing or breathing, vomiting Side effects that usually do not require medical attention (report to your care team if they continue or are bothersome): Bloating Diarrhea Gas Nausea Stomach pain This list may not describe all possible side effects. Call your doctor for medical advice about side effects. You may report side effects to FDA at 1-800-FDA-1088. Where should I keep my medication? Keep out of the reach of children and pets. Store at room temperature between 15 and 30 degrees C (59 and 86 degrees F). Protect from moisture. Get rid of any unused medication after the expiration date. To get rid of medications that are  no longer needed or have expired: Take the medication to a medication take-back program. Check with your pharmacy or law enforcement to find a location. If you cannot return the medication, check the label or package insert to see if the medication should be thrown out in the garbage or flushed down the  toilet. If you are not sure, ask your care team. If it is safe to put it in the trash, pour the medication out of the container. Mix the medication with cat litter, dirt, coffee grounds, or other unwanted substance. Seal the mixture in a bag or container. Put it in the trash. NOTE: This sheet is a summary. It may not cover all possible information. If you have questions about this medicine, talk to your doctor, pharmacist, or health care provider.  2025 Elsevier/Gold Standard (2024-01-13 00:00:00)  EXERCISE AND DIET:  We recommended that you start or continue a regular exercise program for good health. Regular exercise means any activity that makes your heart beat faster and makes you sweat.  We recommend exercising at least 30 minutes per day at least 3 days a week, preferably 4 or 5.  We also recommend a diet low in fat and sugar.  Inactivity, poor dietary choices and obesity can cause diabetes, heart attack, stroke, and kidney damage, among others.    ALCOHOL AND SMOKING:  Women should limit their alcohol intake to no more than 7 drinks/beers/glasses of wine (combined, not each!) per week. Moderation of alcohol intake to this level decreases your risk of breast cancer and liver damage. And of course, no recreational drugs are part of a healthy lifestyle.  And absolutely no smoking or even second hand smoke. Most people know smoking can cause heart and lung diseases, but did you know it also contributes to weakening of your bones? Aging of your skin?  Yellowing of your teeth and nails?  CALCIUM  AND VITAMIN D :  Adequate intake of calcium  and Vitamin D  are recommended.  The recommendations for exact amounts of these supplements seem to change often, but generally speaking 600 mg of calcium  (either carbonate or citrate) and 800 units of Vitamin D  per day seems prudent. Certain women may benefit from higher intake of Vitamin D .  If you are among these women, your doctor will have told you during your visit.     PAP SMEARS:  Pap smears, to check for cervical cancer or precancers,  have traditionally been done yearly, although recent scientific advances have shown that most women can have pap smears less often.  However, every woman still should have a physical exam from her gynecologist every year. It will include a breast check, inspection of the vulva and vagina to check for abnormal growths or skin changes, a visual exam of the cervix, and then an exam to evaluate the size and shape of the uterus and ovaries.  And after 88 years of age, a rectal exam is indicated to check for rectal cancers. We will also provide age appropriate advice regarding health maintenance, like when you should have certain vaccines, screening for sexually transmitted diseases, bone density testing, colonoscopy, mammograms, etc.   MAMMOGRAMS:  All women over 2 years old should have a yearly mammogram. Many facilities now offer a 3D mammogram, which may cost around $50 extra out of pocket. If possible,  we recommend you accept the option to have the 3D mammogram performed.  It both reduces the number of women who will be called back for extra views which then turn  out to be normal, and it is better than the routine mammogram at detecting truly abnormal areas.    COLONOSCOPY:  Colonoscopy to screen for colon cancer is recommended for all women at age 65.  We know, you hate the idea of the prep.  We agree, BUT, having colon cancer and not knowing it is worse!!  Colon cancer so often starts as a polyp that can be seen and removed at colonscopy, which can quite literally save your life!  And if your first colonoscopy is normal and you have no family history of colon cancer, most women don't have to have it again for 10 years.  Once every ten years, you can do something that may end up saving your life, right?  We will be happy to help you get it scheduled when you are ready.  Be sure to check your insurance coverage so you understand how  much it will cost.  It may be covered as a preventative service at no cost, but you should check your particular policy.
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
## 2024-06-11 ENCOUNTER — Ambulatory Visit: Payer: Self-pay | Admitting: Obstetrics and Gynecology

## 2024-06-11 LAB — CYTOLOGY - PAP: Diagnosis: NEGATIVE

## 2024-06-12 ENCOUNTER — Telehealth: Payer: Self-pay

## 2024-06-12 ENCOUNTER — Telehealth: Payer: Self-pay | Admitting: Obstetrics and Gynecology

## 2024-06-12 DIAGNOSIS — R159 Full incontinence of feces: Secondary | ICD-10-CM

## 2024-06-12 DIAGNOSIS — N816 Rectocele: Secondary | ICD-10-CM

## 2024-06-12 DIAGNOSIS — R350 Frequency of micturition: Secondary | ICD-10-CM

## 2024-06-12 NOTE — Telephone Encounter (Signed)
 Please assist in making referral for pelvic floor therapy at [facility] for my patient.  She has diagnoses of rectocele, fecal incontinence, and urinary frequency.

## 2024-06-12 NOTE — Telephone Encounter (Signed)
 [facility] fax sent 06/10/2024

## 2024-06-15 NOTE — Telephone Encounter (Signed)
Referral placed.   Routing to [facility].   Encounter closed.

## 2024-06-16 ENCOUNTER — Encounter: Payer: Self-pay | Admitting: Internal Medicine

## 2024-06-16 ENCOUNTER — Ambulatory Visit: Admitting: Internal Medicine

## 2024-06-16 VITALS — BP 110/82 | HR 81 | Temp 98.6°F | Ht <= 58 in | Wt 123.4 lb

## 2024-06-16 DIAGNOSIS — N1831 Chronic kidney disease, stage 3a: Secondary | ICD-10-CM | POA: Diagnosis not present

## 2024-06-16 DIAGNOSIS — E559 Vitamin D deficiency, unspecified: Secondary | ICD-10-CM | POA: Diagnosis not present

## 2024-06-16 DIAGNOSIS — R197 Diarrhea, unspecified: Secondary | ICD-10-CM

## 2024-06-16 DIAGNOSIS — M7062 Trochanteric bursitis, left hip: Secondary | ICD-10-CM | POA: Diagnosis not present

## 2024-06-16 DIAGNOSIS — K219 Gastro-esophageal reflux disease without esophagitis: Secondary | ICD-10-CM

## 2024-06-16 DIAGNOSIS — E538 Deficiency of other specified B group vitamins: Secondary | ICD-10-CM | POA: Diagnosis not present

## 2024-06-16 MED ORDER — ONDANSETRON 4 MG PO TBDP: 4.0000 mg | ORAL_TABLET | Freq: Three times a day (TID) | ORAL | 2 refills | Status: AC | PRN

## 2024-06-16 NOTE — Assessment & Plan Note (Signed)
Voltaren gel 2-4 times a day Ice/heat/massage We can inject the hip with steroids

## 2024-06-16 NOTE — Patient Instructions (Signed)

## 2024-06-16 NOTE — Assessment & Plan Note (Signed)
 Continue w/B complex po

## 2024-06-16 NOTE — Progress Notes (Signed)
 Subjective:  Patient ID: Joyce Riley, female    DOB: 09/23/1934  Age: 89 y.o. MRN: [id number]  CC: No chief complaint on file.   HPI Joyce Riley presents for episodic nausea - Zofran  helps, needs ODT tablets  Outpatient Medications Prior to Visit  Medication Sig Dispense Refill   B-Complex CAPS Take by mouth.     calcium -vitamin D  (OSCAL WITH D) 500-200 MG-UNIT tablet Take 1 tablet by mouth 2 (two) times daily. 100 tablet 3   chlorhexidine (PERIDEX) 0.12 % solution SMARTSIG:By Mouth     Cholecalciferol  1000 UNITS tablet Take 1 tablet (1,000 Units total) by mouth daily. 100 tablet 3   denosumab  (PROLIA ) 60 MG/ML SOSY injection Inject 60 mg into the skin every 6 (six) months.     diclofenac  Sodium (VOLTAREN ) 1 % GEL Apply 2 g topically 4 (four) times daily. To the L hip are 300 g 1   Incontinence Supply Disposable (DEPEND FITTED BRIEFS SM/MED) MISC As directed 100 each 11   Incontinence Supply Disposable (UNDERPADS REGULAR) MISC As directed 40 each 5   loperamide  (IMODIUM  A-D) 2 MG tablet Take 1-2 tablets (2-4 mg total) by mouth 4 (four) times daily as needed for diarrhea or loose stools. 60 tablet 1   Magnesium  250 MG TABS Take 1 tablet (250 mg total) by mouth daily. 100 tablet 3   meclizine  (ANTIVERT ) 12.5 MG tablet Take 12.5-25 mg by mouth 3 (three) times daily as needed.     omeprazole  (PRILOSEC) 40 MG capsule TAKE 1 CAPSULE (40 MG TOTAL) BY MOUTH DAILY. 90 capsule 3   triamcinolone  ointment (KENALOG ) 0.5 % Apply 1 application topically 2 (two) times daily. 90 g 3   ondansetron  (ZOFRAN ) 4 MG tablet Take 1 tablet (4 mg total) by mouth every 8 (eight) hours as needed for nausea or vomiting. 20 tablet 1   Facility-Administered Medications Prior to Visit  Medication Dose Route Frequency Provider Last Rate Last Admin   denosumab  (PROLIA ) injection 60 mg  60 mg Subcutaneous Once Nova Evett V, MD       [START ON 10/26/2024] denosumab  (PROLIA ) injection 60 mg  60 mg Subcutaneous  Once Mirabel Ahlgren V, MD        ROS: Review of Systems  Constitutional:  Negative for activity change, appetite change, chills, fatigue and unexpected weight change.  HENT:  Negative for congestion, mouth sores and sinus pressure.   Eyes:  Negative for visual disturbance.  Respiratory:  Negative for cough and chest tightness.   Gastrointestinal:  Positive for nausea. Negative for abdominal pain and vomiting.  Genitourinary:  Negative for difficulty urinating, frequency and vaginal pain.  Musculoskeletal:  Positive for arthralgias. Negative for back pain and gait problem.  Skin:  Negative for pallor and rash.  Neurological:  Negative for dizziness, tremors, weakness, numbness and headaches.  Psychiatric/Behavioral:  Positive for decreased concentration. Negative for confusion and sleep disturbance. The patient is nervous/anxious.     Objective:  BP 110/82   Pulse 81   Temp 98.6 F (37 C) (Oral)   Ht 4' 7 (1.397 m)   Wt 123 lb 6.4 oz (56 kg)   SpO2 92%   BMI 28.68 kg/m   BP Readings from Last 3 Encounters:  06/16/24 110/82  06/10/24 126/82  03/16/24 (!) 150/78    Wt Readings from Last 3 Encounters:  06/16/24 123 lb 6.4 oz (56 kg)  06/10/24 126 lb (57.2 kg)  03/16/24 123 lb 3.2 oz (55.9 kg)    Physical Exam  Constitutional:      General: She is not in acute distress.    Appearance: She is well-developed. She is obese.  HENT:     Head: Normocephalic.     Right Ear: External ear normal.     Left Ear: External ear normal.     Nose: Nose normal.  Eyes:     General:        Right eye: No discharge.        Left eye: No discharge.     Conjunctiva/sclera: Conjunctivae normal.     Pupils: Pupils are equal, round, and reactive to light.  Neck:     Thyroid : No thyromegaly.     Vascular: No JVD.     Trachea: No tracheal deviation.  Cardiovascular:     Rate and Rhythm: Normal rate and regular rhythm.     Heart sounds: Normal heart sounds.  Pulmonary:     Effort: No  respiratory distress.     Breath sounds: No stridor. No wheezing.  Abdominal:     General: Bowel sounds are normal. There is no distension.     Palpations: Abdomen is soft. There is no mass.     Tenderness: There is no abdominal tenderness. There is no guarding or rebound.  Musculoskeletal:        General: No tenderness.     Cervical back: Normal range of motion and neck supple. No rigidity.  Lymphadenopathy:     Cervical: No cervical adenopathy.  Skin:    Findings: No erythema or rash.  Neurological:     Cranial Nerves: No cranial nerve deficit.     Motor: No abnormal muscle tone.     Coordination: Coordination normal.     Gait: Gait abnormal.     Deep Tendon Reflexes: Reflexes normal.  Psychiatric:        Behavior: Behavior normal.        Thought Content: Thought content normal.        Judgment: Judgment normal.   L lat hip w/pain  Lab Results  Component Value Date   WBC 5.7 09/12/2023   HGB 11.3 (L) 09/12/2023   HCT 33.6 (L) 09/12/2023   PLT 252.0 09/12/2023   GLUCOSE 82 03/16/2024   CHOL 219 (H) 11/11/2018   TRIG 128.0 11/11/2018   HDL 50.30 11/11/2018   LDLDIRECT 196.7 12/22/2010   LDLCALC 143 (H) 11/11/2018   ALT 10 03/16/2024   AST 14 03/16/2024   NA 133 (L) 03/16/2024   K 4.0 03/16/2024   CL 97 03/16/2024   CREATININE 0.93 03/16/2024   BUN 8 03/16/2024   CO2 24 03/16/2024   TSH 1.93 03/16/2024   INR 0.96 03/24/2010   HGBA1C 5.7 04/26/2010    MM 3D DIAGNOSTIC MAMMOGRAM BILATERAL BREAST Result Date: 04/06/2024 CLINICAL DATA:  Patient presents reporting an insect bite at the base of the right nipple, occurring several weeks ago, now resolved. No current breast complaints. EXAM: DIGITAL DIAGNOSTIC BILATERAL MAMMOGRAM WITH TOMOSYNTHESIS AND CAD TECHNIQUE: Bilateral digital diagnostic mammography and breast tomosynthesis was performed. The images were evaluated with computer-aided detection. COMPARISON:  Previous exam(s). ACR Breast Density Category c: The  breasts are heterogeneously dense, which may obscure small masses. FINDINGS: There are no suspicious masses, areas of significant asymmetry, areas of architectural distortion or suspicious calcifications. On exam, no abnormality is noted at the lateral base of the right nipple in the location of the reported insect bite. IMPRESSION: 1. No evidence of breast malignancy. RECOMMENDATION: Screening mammogram in one year.(Code:SM-B-01Y)  I have discussed the findings and recommendations with the patient. If applicable, a reminder letter will be sent to the patient regarding the next appointment. BI-RADS CATEGORY  1: Negative. Electronically Signed   By: Alm Parkins M.D.   On: 04/06/2024 11:49    Assessment & Plan:   Problem List Items Addressed This Visit     B12 deficiency   Continue w/B complex po      CKD (chronic kidney disease) stage 3, GFR 30-59 ml/min (HCC) - Primary   Hydrate well daily. Monitor GFR      Relevant Orders   Comprehensive metabolic panel with GFR   CBC with Differential/Platelet   Diarrhea   Relevant Orders   TSH   GERD   On Dexilant  did the best - no pain or cough - (not covered) On Omeprazole  40 mg/d - try bid due to cough - pt declined Zofran  ODT      Relevant Medications   ondansetron  (ZOFRAN -ODT) 4 MG disintegrating tablet   Hip bursitis, left   Voltaren  gel 2-4 times a day Ice/heat/massage We can inject the hip with steroids      Relevant Orders   CBC with Differential/Platelet   Vitamin D  deficiency   On Vit D         Meds ordered this encounter  Medications   ondansetron  (ZOFRAN -ODT) 4 MG disintegrating tablet    Sig: Take 1 tablet (4 mg total) by mouth every 8 (eight) hours as needed for nausea or vomiting.    Dispense:  21 tablet    Refill:  2      Follow-up: Return in about 3 months (around 09/15/2024) for a follow-up visit.  Marolyn Noel, MD

## 2024-06-16 NOTE — Assessment & Plan Note (Signed)
 On Vit D

## 2024-06-16 NOTE — Assessment & Plan Note (Signed)
 On Dexilant  did the best - no pain or cough - (not covered) On Omeprazole  40 mg/d - try bid due to cough - pt declined Zofran  ODT

## 2024-06-16 NOTE — Assessment & Plan Note (Signed)
Hydrate well daily. Monitor GFR 

## 2024-06-24 ENCOUNTER — Ambulatory Visit: Admitting: Podiatry

## 2024-06-24 ENCOUNTER — Encounter: Payer: Self-pay | Admitting: Podiatry

## 2024-06-24 DIAGNOSIS — M79675 Pain in left toe(s): Secondary | ICD-10-CM | POA: Diagnosis not present

## 2024-06-24 DIAGNOSIS — B351 Tinea unguium: Secondary | ICD-10-CM | POA: Diagnosis not present

## 2024-06-24 DIAGNOSIS — M79674 Pain in right toe(s): Secondary | ICD-10-CM | POA: Diagnosis not present

## 2024-06-25 DIAGNOSIS — Z23 Encounter for immunization: Secondary | ICD-10-CM | POA: Diagnosis not present

## 2024-06-25 NOTE — Progress Notes (Signed)
  Subjective:  Patient ID: Joyce Riley, female    DOB: 05/16/1934,  MRN: [id number]  89 y.o. female presents to clinic with  painful thick toenails that are difficult to trim. Pain interferes with ambulation. Aggravating factors include wearing enclosed shoe gear. Pain is relieved with periodic professional debridement. She presents with [place] interpreter, Russ, on today's visit. They voice no new pedal concerns on today's visit. Chief Complaint  Patient presents with   RFC    RFC Non diabetic foot care. Toenail trim. LOV with PCP 06/16/24.    New problem(s): None   PCP is Plotnikov, Karlynn GAILS, MD.  Allergies  Allergen Reactions   Aspirin     bruising   Atorvastatin     REACTION: myalgia   Codeine Other (See Comments)   Meclizine  Hcl     REACTION: rash   Milk-Related Compounds    Simvastatin     Dizzy, vomiting    Review of Systems: Negative except as noted in the HPI.   Objective:  Joyce Riley is a pleasant 89 y.o. female WD, WN in NAD. AAO x 3.  Vascular Examination: Vascular status intact b/l with palpable pedal pulses. Pedal hair sparse. CFT immediate b/l. No edema. No pain with calf compression b/l. Skin temperature gradient WNL b/l. No cyanosis or clubbing noted b/l LE.  Neurological Examination: Sensation grossly intact b/l with 10 gram monofilament. Vibratory sensation intact b/l.   Dermatological Examination: Pedal skin with normal turgor, texture and tone b/l. Toenails 1-5 b/l thick, discolored, elongated with subungual debris and pain on dorsal palpation. No corns, calluses nor porokeratotic lesions noted.  Musculoskeletal Examination: Muscle strength 5/5 to b/l LE. HAV with bunion deformity noted b/l LE.  Radiographs: None Assessment:   1. Pain due to onychomycosis of toenails of both feet    Plan:  -Patient's primary language is [place]. Patient seen with assistance of in-person interpreter on today's visit.  Consent given for treatment. Patient  examined. All patient's and/or POA's questions/concerns addressed on today's visit. Toenails 1-5 debrided in length and girth without incident. Treatment was provided by assistant Andrez Manchester under my supervision. Continue soft, supportive shoe gear daily. Report any pedal injuries to medical professional. Call office if there are any questions/concerns.  Return in about 3 months (around 09/24/2024).  [name], DPM      [place] LOCATION: [address] [place] [address]                   Office [phone]   [facility] LOCATION: [address] [place], [place] [address] Office [phone]
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
## 2024-08-12 ENCOUNTER — Ambulatory Visit: Attending: Obstetrics and Gynecology | Admitting: Physical Therapy

## 2024-08-12 ENCOUNTER — Other Ambulatory Visit: Payer: Self-pay

## 2024-08-12 ENCOUNTER — Encounter: Payer: Self-pay | Admitting: Physical Therapy

## 2024-08-12 DIAGNOSIS — R159 Full incontinence of feces: Secondary | ICD-10-CM | POA: Diagnosis not present

## 2024-08-12 DIAGNOSIS — N816 Rectocele: Secondary | ICD-10-CM | POA: Insufficient documentation

## 2024-08-12 DIAGNOSIS — M6281 Muscle weakness (generalized): Secondary | ICD-10-CM | POA: Insufficient documentation

## 2024-08-12 DIAGNOSIS — R350 Frequency of micturition: Secondary | ICD-10-CM | POA: Insufficient documentation

## 2024-08-12 DIAGNOSIS — R278 Other lack of coordination: Secondary | ICD-10-CM | POA: Diagnosis not present

## 2024-08-19 DIAGNOSIS — H40023 Open angle with borderline findings, high risk, bilateral: Secondary | ICD-10-CM | POA: Diagnosis not present

## 2024-08-19 DIAGNOSIS — H04123 Dry eye syndrome of bilateral lacrimal glands: Secondary | ICD-10-CM | POA: Diagnosis not present

## 2024-08-19 DIAGNOSIS — H02206 Unspecified lagophthalmos left eye, unspecified eyelid: Secondary | ICD-10-CM | POA: Diagnosis not present

## 2024-08-19 DIAGNOSIS — H02203 Unspecified lagophthalmos right eye, unspecified eyelid: Secondary | ICD-10-CM | POA: Diagnosis not present

## 2024-09-23 ENCOUNTER — Encounter: Payer: Self-pay | Admitting: Internal Medicine

## 2024-09-23 ENCOUNTER — Ambulatory Visit: Admitting: Internal Medicine

## 2024-09-23 VITALS — BP 120/80 | HR 73 | Temp 98.6°F | Ht <= 58 in | Wt 128.0 lb

## 2024-09-23 DIAGNOSIS — E559 Vitamin D deficiency, unspecified: Secondary | ICD-10-CM | POA: Diagnosis not present

## 2024-09-23 DIAGNOSIS — M810 Age-related osteoporosis without current pathological fracture: Secondary | ICD-10-CM | POA: Diagnosis not present

## 2024-09-23 DIAGNOSIS — N1831 Chronic kidney disease, stage 3a: Secondary | ICD-10-CM

## 2024-09-23 DIAGNOSIS — R413 Other amnesia: Secondary | ICD-10-CM | POA: Diagnosis not present

## 2024-09-23 DIAGNOSIS — K9049 Malabsorption due to intolerance, not elsewhere classified: Secondary | ICD-10-CM | POA: Diagnosis not present

## 2024-09-23 DIAGNOSIS — M7062 Trochanteric bursitis, left hip: Secondary | ICD-10-CM | POA: Diagnosis not present

## 2024-09-23 DIAGNOSIS — E538 Deficiency of other specified B group vitamins: Secondary | ICD-10-CM

## 2024-09-23 DIAGNOSIS — Z0000 Encounter for general adult medical examination without abnormal findings: Secondary | ICD-10-CM

## 2024-09-23 DIAGNOSIS — R197 Diarrhea, unspecified: Secondary | ICD-10-CM

## 2024-09-23 DIAGNOSIS — R152 Fecal urgency: Secondary | ICD-10-CM

## 2024-09-23 DIAGNOSIS — R32 Unspecified urinary incontinence: Secondary | ICD-10-CM | POA: Diagnosis not present

## 2024-09-23 MED ORDER — UNDERPADS REGULAR MISC: 5 refills | Status: AC

## 2024-09-23 MED ORDER — DEPEND FITTED BRIEFS SM/MED MISC: 11 refills | Status: AC

## 2024-09-23 NOTE — Assessment & Plan Note (Signed)
 Next prolia  inj - Oct 26, 2024

## 2024-09-23 NOTE — Assessment & Plan Note (Signed)
Hydrate well daily. Monitor GFR 

## 2024-09-23 NOTE — Assessment & Plan Note (Signed)
 Pt asked to reduce the number of briefs (Airflow supplies)

## 2024-09-23 NOTE — Progress Notes (Signed)
 "  Subjective:  Patient ID: Joyce Riley, female    DOB: 08-31-1934  Age: 89 y.o. MRN: [id number]  CC: Medical Management of Chronic Issues (3 mnth f/u)   HPI Jayline Kilburg presents for osteoporosis, GERD, OA Pt asked to reduce the number of briefs (Airflow supplies) C/o L hip pain  Outpatient Medications Prior to Visit  Medication Sig Dispense Refill   B-Complex CAPS Take by mouth.     calcium -vitamin D  (OSCAL WITH D) 500-200 MG-UNIT tablet Take 1 tablet by mouth 2 (two) times daily. 100 tablet 3   chlorhexidine (PERIDEX) 0.12 % solution SMARTSIG:By Mouth     Cholecalciferol  1000 UNITS tablet Take 1 tablet (1,000 Units total) by mouth daily. 100 tablet 3   denosumab  (PROLIA ) 60 MG/ML SOSY injection Inject 60 mg into the skin every 6 (six) months.     diclofenac  Sodium (VOLTAREN ) 1 % GEL Apply 2 g topically 4 (four) times daily. To the L hip are 300 g 1   loperamide  (IMODIUM  A-D) 2 MG tablet Take 1-2 tablets (2-4 mg total) by mouth 4 (four) times daily as needed for diarrhea or loose stools. 60 tablet 1   Magnesium  250 MG TABS Take 1 tablet (250 mg total) by mouth daily. 100 tablet 3   meclizine  (ANTIVERT ) 12.5 MG tablet Take 12.5-25 mg by mouth 3 (three) times daily as needed.     omeprazole  (PRILOSEC) 40 MG capsule TAKE 1 CAPSULE (40 MG TOTAL) BY MOUTH DAILY. 90 capsule 3   ondansetron  (ZOFRAN -ODT) 4 MG disintegrating tablet Take 1 tablet (4 mg total) by mouth every 8 (eight) hours as needed for nausea or vomiting. 21 tablet 2   triamcinolone  ointment (KENALOG ) 0.5 % Apply 1 application topically 2 (two) times daily. 90 g 3   Incontinence Supply Disposable (DEPEND FITTED BRIEFS SM/MED) MISC As directed 100 each 11   Incontinence Supply Disposable (UNDERPADS REGULAR) MISC As directed 40 each 5   Facility-Administered Medications Prior to Visit  Medication Dose Route Frequency Provider Last Rate Last Admin   denosumab  (PROLIA ) injection 60 mg  60 mg Subcutaneous Once Garron Eline,  Victorina Kable V, MD       [START ON 10/26/2024] denosumab  (PROLIA ) injection 60 mg  60 mg Subcutaneous Once Shahara Hartsfield V, MD        ROS: Review of Systems  Constitutional:  Positive for fatigue. Negative for activity change, appetite change, chills and unexpected weight change.  HENT:  Negative for congestion, mouth sores and sinus pressure.   Eyes:  Negative for visual disturbance.  Respiratory:  Negative for cough and chest tightness.   Cardiovascular:  Negative for leg swelling.  Gastrointestinal:  Positive for diarrhea. Negative for abdominal pain and nausea.  Genitourinary:  Positive for frequency and urgency. Negative for difficulty urinating and vaginal pain.  Musculoskeletal:  Positive for arthralgias, back pain and gait problem.  Skin:  Negative for pallor and rash.  Neurological:  Negative for dizziness, tremors, weakness, numbness and headaches.  Hematological:  Does not bruise/bleed easily.  Psychiatric/Behavioral:  Positive for decreased concentration. Negative for confusion, dysphoric mood, sleep disturbance and suicidal ideas. The patient is not nervous/anxious.     Objective:  BP 120/80   Pulse 73   Temp 98.6 F (37 C) (Oral)   Ht 4' 7 (1.397 m)   Wt 128 lb (58.1 kg)   SpO2 97%   BMI 29.75 kg/m   BP Readings from Last 3 Encounters:  09/23/24 120/80  06/16/24 110/82  06/10/24 126/82  Wt Readings from Last 3 Encounters:  09/23/24 128 lb (58.1 kg)  06/16/24 123 lb 6.4 oz (56 kg)  06/10/24 126 lb (57.2 kg)    Physical Exam Constitutional:      General: She is not in acute distress.    Appearance: She is well-developed. She is obese.  HENT:     Head: Normocephalic.     Right Ear: External ear normal.     Left Ear: External ear normal.     Nose: Nose normal.  Eyes:     General:        Right eye: No discharge.        Left eye: No discharge.     Conjunctiva/sclera: Conjunctivae normal.     Pupils: Pupils are equal, round, and reactive to light.   Neck:     Thyroid : No thyromegaly.     Vascular: No JVD.     Trachea: No tracheal deviation.  Cardiovascular:     Rate and Rhythm: Normal rate and regular rhythm.     Heart sounds: Normal heart sounds.  Pulmonary:     Effort: No respiratory distress.     Breath sounds: No stridor. No wheezing.  Abdominal:     General: Bowel sounds are normal. There is no distension.     Palpations: Abdomen is soft. There is no mass.     Tenderness: There is no abdominal tenderness. There is no guarding or rebound.  Musculoskeletal:        General: Tenderness present.     Cervical back: Normal range of motion and neck supple. No rigidity.     Right lower leg: No edema.     Left lower leg: No edema.  Lymphadenopathy:     Cervical: No cervical adenopathy.  Skin:    Findings: No erythema or rash.  Neurological:     Mental Status: Mental status is at baseline.     Cranial Nerves: No cranial nerve deficit.     Motor: No abnormal muscle tone.     Coordination: Coordination abnormal.     Gait: Gait abnormal.     Deep Tendon Reflexes: Reflexes normal.  Psychiatric:        Behavior: Behavior normal.        Thought Content: Thought content normal.        Judgment: Judgment normal.   Arthritic gait Alert and cooperative L lat hip w/pain Scoliosis  Lab Results  Component Value Date   WBC 5.7 09/12/2023   HGB 11.3 (L) 09/12/2023   HCT 33.6 (L) 09/12/2023   PLT 252.0 09/12/2023   GLUCOSE 82 03/16/2024   CHOL 219 (H) 11/11/2018   TRIG 128.0 11/11/2018   HDL 50.30 11/11/2018   LDLDIRECT 196.7 12/22/2010   LDLCALC 143 (H) 11/11/2018   ALT 10 03/16/2024   AST 14 03/16/2024   NA 133 (L) 03/16/2024   K 4.0 03/16/2024   CL 97 03/16/2024   CREATININE 0.93 03/16/2024   BUN 8 03/16/2024   CO2 24 03/16/2024   TSH 1.93 03/16/2024   INR 0.96 03/24/2010   HGBA1C 5.7 04/26/2010    No results found.  Assessment & Plan:   Problem List Items Addressed This Visit     B12 deficiency   Continue  w/B complex po      Vitamin D  deficiency   On Vit D      Osteoporosis   Next prolia  inj - Oct 26, 2024      Milk intolerance   Pt asked  to reduce the number of briefs (Airflow supplies)      Diarrhea   Imodium  prn Lactose intolerance - avoid milk      Incontinence in female   Pt asked to reduce the number of briefs (Airflow supplies)      CKD (chronic kidney disease) stage 3, GFR 30-59 ml/min (HCC)   Hydrate well daily. Monitor GFR      Hip bursitis, left - Primary   Chronic Voltaren  gel 2-4 times a day Ice/heat/massage/heat We can inject the hip with steroids - refused Tylenol po - refused      Memory loss   Stable      Fecal urgency   Pt asked to reduce the number of briefs (Airflow supplies)         Meds ordered this encounter  Medications   Incontinence Supply Disposable (DEPEND FITTED BRIEFS SM/MED) MISC    Sig: As directed    Dispense:  50 each    Refill:  11   Incontinence Supply Disposable (UNDERPADS REGULAR) MISC    Sig: As directed    Dispense:  40 each    Refill:  5      Follow-up: Return in about 3 months (around 12/22/2024) for a follow-up visit.  Marolyn Noel, MD "

## 2024-09-23 NOTE — Assessment & Plan Note (Signed)
 On Vit D

## 2024-09-23 NOTE — Assessment & Plan Note (Signed)
 Continue w/B complex po

## 2024-09-23 NOTE — Assessment & Plan Note (Signed)
 Chronic Voltaren  gel 2-4 times a day Ice/heat/massage/heat We can inject the hip with steroids - refused Tylenol po - refused

## 2024-09-23 NOTE — Assessment & Plan Note (Signed)
 Imodium  prn Lactose intolerance - avoid milk

## 2024-09-23 NOTE — Addendum Note (Signed)
 Addended by: HEDDY IP R on: 09/23/2024 12:09 PM   Modules accepted: Orders

## 2024-09-23 NOTE — Assessment & Plan Note (Signed)
 Stable

## 2024-09-26 LAB — COMPREHENSIVE METABOLIC PANEL WITH GFR
AG Ratio: 1.4 (calc) (ref 1.0–2.5)
ALT: 11 U/L (ref 6–29)
AST: 13 U/L (ref 10–35)
Albumin: 4 g/dL (ref 3.6–5.1)
Alkaline phosphatase (APISO): 49 U/L (ref 37–153)
BUN/Creatinine Ratio: 17 (calc) (ref 6–22)
BUN: 17 mg/dL (ref 7–25)
CO2: 29 mmol/L (ref 20–32)
Calcium: 8.8 mg/dL (ref 8.6–10.4)
Chloride: 101 mmol/L (ref 98–110)
Creat: 1.02 mg/dL — ABNORMAL HIGH (ref 0.60–0.95)
Globulin: 2.8 g/dL (ref 1.9–3.7)
Glucose, Bld: 96 mg/dL (ref 65–99)
Potassium: 3.9 mmol/L (ref 3.5–5.3)
Sodium: 138 mmol/L (ref 135–146)
Total Bilirubin: 0.2 mg/dL (ref 0.2–1.2)
Total Protein: 6.8 g/dL (ref 6.1–8.1)
eGFR: 53 mL/min/1.73m2 — ABNORMAL LOW

## 2024-09-26 LAB — CBC WITH DIFFERENTIAL/PLATELET
Absolute Lymphocytes: 1701 {cells}/uL (ref 850–3900)
Absolute Monocytes: 445 {cells}/uL (ref 200–950)
Basophils Absolute: 42 {cells}/uL (ref 0–200)
Basophils Relative: 0.8 %
Eosinophils Absolute: 80 {cells}/uL (ref 15–500)
Eosinophils Relative: 1.5 %
HCT: 34.2 % — ABNORMAL LOW (ref 35.9–46.0)
Hemoglobin: 11 g/dL — ABNORMAL LOW (ref 11.7–15.5)
MCH: 28.8 pg (ref 27.0–33.0)
MCHC: 32.2 g/dL (ref 31.6–35.4)
MCV: 89.5 fL (ref 81.4–101.7)
MPV: 11 fL (ref 7.5–12.5)
Monocytes Relative: 8.4 %
Neutro Abs: 3032 {cells}/uL (ref 1500–7800)
Neutrophils Relative %: 57.2 %
Platelets: 198 Thousand/uL (ref 140–400)
RBC: 3.82 Million/uL (ref 3.80–5.10)
RDW: 12.3 % (ref 11.0–15.0)
Total Lymphocyte: 32.1 %
WBC: 5.3 Thousand/uL (ref 3.8–10.8)

## 2024-09-26 LAB — TSH: TSH: 2.47 m[IU]/L (ref 0.40–4.50)

## 2024-09-29 ENCOUNTER — Ambulatory Visit: Payer: Self-pay | Admitting: Internal Medicine

## 2024-10-01 ENCOUNTER — Telehealth: Payer: Self-pay

## 2024-10-01 NOTE — Telephone Encounter (Signed)
 Prolia  VOB initiated via [email or web address]  Next Prolia  inj DUE: 10/25/24

## 2024-10-05 ENCOUNTER — Other Ambulatory Visit (HOSPITAL_COMMUNITY): Payer: Self-pay

## 2024-10-05 NOTE — Telephone Encounter (Signed)
 Joyce Riley

## 2024-10-05 NOTE — Telephone Encounter (Signed)
 Pt ready for scheduling for PROLIA  on or after : 10/25/24  Option# 1: Buy/Bill (Office supplied medication)  Out-of-pocket cost due at time of clinic visit: $0  Number of injection/visits approved: ---  Primary: MEDICARE Prolia  co-insurance: 0% Admin fee co-insurance: 0%  Secondary: MEDICAID Prolia  co-insurance:  Admin fee co-insurance:   Medical Benefit Details: Date Benefits were checked: 10/01/24 Deductible: NO/ Coinsurance: 0%/ Admin Fee: 0%  Prior Auth: N/A PA# Expiration Date:   # of doses approved: ----------------------------------------------------------------------- Option# 2- Med Obtained from pharmacy:  Pharmacy benefit: Copay $4.90 (Paid to pharmacy) Admin Fee: 0% (Pay at clinic)  Prior Auth: N/A PA# Expiration Date:   # of doses approved:   If patient wants fill through the pharmacy benefit please send prescription to: [facility], and include estimated need by date in rx notes. Pharmacy will ship medication directly to the office.  Patient NOT eligible for Prolia  Copay Card. Copay Card can make patient's cost as little as $25. Link to apply: [email or web address]://[email or web address]  ** This summary of benefits is an estimation of the patient's out-of-pocket cost. Exact cost may very based on individual plan coverage.

## 2024-10-07 ENCOUNTER — Encounter: Payer: Self-pay | Admitting: Physical Therapy

## 2024-10-07 ENCOUNTER — Ambulatory Visit: Attending: Obstetrics and Gynecology | Admitting: Physical Therapy

## 2024-10-07 DIAGNOSIS — M6281 Muscle weakness (generalized): Secondary | ICD-10-CM | POA: Insufficient documentation

## 2024-10-07 DIAGNOSIS — R278 Other lack of coordination: Secondary | ICD-10-CM | POA: Insufficient documentation
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
## 2024-10-13 ENCOUNTER — Encounter: Payer: Self-pay | Admitting: Podiatry

## 2024-10-13 ENCOUNTER — Ambulatory Visit (INDEPENDENT_AMBULATORY_CARE_PROVIDER_SITE_OTHER): Admitting: Podiatry

## 2024-10-13 DIAGNOSIS — B351 Tinea unguium: Secondary | ICD-10-CM

## 2024-10-13 DIAGNOSIS — M79675 Pain in left toe(s): Secondary | ICD-10-CM

## 2024-10-13 DIAGNOSIS — M79674 Pain in right toe(s): Secondary | ICD-10-CM

## 2024-10-14 ENCOUNTER — Encounter: Payer: Self-pay | Admitting: Physical Therapy

## 2024-10-14 ENCOUNTER — Ambulatory Visit: Admitting: Physical Therapy

## 2024-10-14 DIAGNOSIS — R278 Other lack of coordination: Secondary | ICD-10-CM

## 2024-10-14 DIAGNOSIS — M6281 Muscle weakness (generalized): Secondary | ICD-10-CM
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
## 2024-10-18 ENCOUNTER — Encounter: Payer: Self-pay | Admitting: Podiatry

## 2024-10-18 NOTE — Progress Notes (Signed)
"  °  Subjective:  Patient ID: Joyce Riley, female    DOB: 1934/05/18,  MRN: [id number]  Joyce Riley presents to clinic today for preventative diabetic foot care for painful mycotic toenails of both feet that are difficult to trim. Pain interferes with daily activities and wearing enclosed shoe gear comfortably. She is accompanied by Russian interpreter, Joyce Riley, on today's visit. Chief Complaint  Patient presents with   RFC     RFC Toenail trim.   New problem(s): None.   PCP is Plotnikov, Karlynn GAILS, MD.  Allergies[1]  Review of Systems: Negative except as noted in the HPI.  Objective: No changes noted in today's physical examination. There were no vitals filed for this visit. Joyce Riley is a pleasant 89 y.o. female WD, WN in NAD. AAO x 3.  Vascular Examination: Vascular status intact b/l with palpable pedal pulses. Pedal hair sparse. CFT immediate b/l. No edema. No pain with calf compression b/l. Skin temperature gradient WNL b/l. No cyanosis or clubbing noted b/l LE.  Neurological Examination: Sensation grossly intact b/l with 10 gram monofilament. Vibratory sensation intact b/l.   Dermatological Examination: Pedal skin with normal turgor, texture and tone b/l. Toenails 1-5 b/l thick, discolored, elongated with subungual debris and pain on dorsal palpation. No corns, calluses nor porokeratotic lesions noted.  Musculoskeletal Examination: Muscle strength 5/5 to b/l LE. HAV with bunion deformity noted b/l LE.  Radiographs: None  Assessment/Plan: 1. Pain due to onychomycosis of toenails of both feet   -Patient's primary language is Russian. Patient seen with assistance of in-person interpreter on today's visit.  Consent given for treatment. Patient examined. All patient's and/or POA's questions/concerns addressed on today's visit. Mycotic toenails 1-5 b/l debrided in length and girth without incident. Treatment was provided by assistant Joyce Riley under my  supervision. Continue soft, supportive shoe gear daily. Report any pedal injuries to medical professional. Call office if there are any quesitons/concerns. Patient/POA to call should there be question/concern in the interim.   Return in about 3 months (around 01/11/2025).  Joyce Riley, DPM      [facility] LOCATION: [address] [place] [address]                   Office [phone]   [facility] LOCATION: [address] [place], [place] [address] Office [phone]     [1]  Allergies Allergen Reactions   Aspirin     bruising   Atorvastatin     REACTION: myalgia   Codeine Other (See Comments)   Meclizine  Hcl     REACTION: rash   Milk-Related Compounds    Simvastatin     Dizzy, vomiting   "

## 2024-10-21 ENCOUNTER — Telehealth: Payer: Self-pay

## 2024-10-21 ENCOUNTER — Ambulatory Visit

## 2024-10-21 DIAGNOSIS — M6281 Muscle weakness (generalized): Secondary | ICD-10-CM | POA: Diagnosis not present

## 2024-10-21 DIAGNOSIS — R278 Other lack of coordination: Secondary | ICD-10-CM

## 2024-10-21 NOTE — Patient Instructions (Signed)

## 2024-10-21 NOTE — Telephone Encounter (Signed)
 Copied from CRM #[id number]. Topic: General - Other >> Oct 21, 2024  3:25 PM Macario HERO wrote: Reason for CRM: Patient daughter Mallie called said patient received a message to schedule prolia  shot. Call back: [phone]
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
## 2024-10-22 NOTE — Telephone Encounter (Signed)
 Spoke with the pts daughter and was able to get pt sched for her prolia  injection.

## 2024-10-28 ENCOUNTER — Ambulatory Visit: Admitting: Physical Therapy

## 2024-11-04 ENCOUNTER — Ambulatory Visit: Attending: Obstetrics and Gynecology | Admitting: Physical Therapy

## 2024-11-09 ENCOUNTER — Ambulatory Visit

## 2024-12-23 ENCOUNTER — Ambulatory Visit: Admitting: Internal Medicine

## 2025-01-13 ENCOUNTER — Ambulatory Visit: Admitting: Podiatry

## 2025-02-23 ENCOUNTER — Ambulatory Visit
